# Patient Record
Sex: Male | Born: 1940 | Race: White | Hispanic: No | Marital: Married | Smoking: Never smoker
Health system: Southern US, Community
[De-identification: ages and names within clinical notes are randomized; demographics above are authoritative.]

## PROBLEM LIST (undated history)

## (undated) DIAGNOSIS — D696 Thrombocytopenia, unspecified: Secondary | ICD-10-CM

## (undated) DIAGNOSIS — M1990 Unspecified osteoarthritis, unspecified site: Secondary | ICD-10-CM

## (undated) DIAGNOSIS — G629 Polyneuropathy, unspecified: Secondary | ICD-10-CM

## (undated) DIAGNOSIS — E119 Type 2 diabetes mellitus without complications: Secondary | ICD-10-CM

## (undated) DIAGNOSIS — Z9889 Other specified postprocedural states: Secondary | ICD-10-CM

## (undated) DIAGNOSIS — T4145XA Adverse effect of unspecified anesthetic, initial encounter: Secondary | ICD-10-CM

## (undated) DIAGNOSIS — R42 Dizziness and giddiness: Secondary | ICD-10-CM

## (undated) DIAGNOSIS — I351 Nonrheumatic aortic (valve) insufficiency: Secondary | ICD-10-CM

## (undated) DIAGNOSIS — E669 Obesity, unspecified: Secondary | ICD-10-CM

## (undated) DIAGNOSIS — R5383 Other fatigue: Secondary | ICD-10-CM

## (undated) DIAGNOSIS — G4733 Obstructive sleep apnea (adult) (pediatric): Secondary | ICD-10-CM

## (undated) DIAGNOSIS — R112 Nausea with vomiting, unspecified: Secondary | ICD-10-CM

## (undated) DIAGNOSIS — T8859XA Other complications of anesthesia, initial encounter: Secondary | ICD-10-CM

## (undated) DIAGNOSIS — R001 Bradycardia, unspecified: Secondary | ICD-10-CM

## (undated) DIAGNOSIS — R0789 Other chest pain: Secondary | ICD-10-CM

## (undated) DIAGNOSIS — E785 Hyperlipidemia, unspecified: Secondary | ICD-10-CM

## (undated) DIAGNOSIS — I10 Essential (primary) hypertension: Secondary | ICD-10-CM

## (undated) DIAGNOSIS — I517 Cardiomegaly: Secondary | ICD-10-CM

## (undated) HISTORY — DX: Nonrheumatic aortic (valve) insufficiency: I51.7

## (undated) HISTORY — DX: Hyperlipidemia, unspecified: E78.5

## (undated) HISTORY — DX: Essential (primary) hypertension: I10

## (undated) HISTORY — PX: OTHER SURGICAL HISTORY: SHX169

## (undated) HISTORY — DX: Dizziness and giddiness: R42

## (undated) HISTORY — DX: Other chest pain: R07.89

## (undated) HISTORY — DX: Polyneuropathy, unspecified: G62.9

## (undated) HISTORY — DX: Obesity, unspecified: E66.9

## (undated) HISTORY — DX: Thrombocytopenia, unspecified: D69.6

## (undated) HISTORY — DX: Other fatigue: R53.83

## (undated) HISTORY — PX: CARDIAC CATHETERIZATION: SHX172

## (undated) HISTORY — DX: Bradycardia, unspecified: R00.1

## (undated) HISTORY — DX: Obstructive sleep apnea (adult) (pediatric): G47.33

## (undated) HISTORY — DX: Cardiomegaly: I35.1

---

## 2001-06-13 ENCOUNTER — Ambulatory Visit (HOSPITAL_COMMUNITY): Admission: RE | Admit: 2001-06-13 | Discharge: 2001-06-13 | Payer: Self-pay | Admitting: Internal Medicine

## 2002-01-17 ENCOUNTER — Emergency Department (HOSPITAL_COMMUNITY): Admission: EM | Admit: 2002-01-17 | Discharge: 2002-01-17 | Payer: Self-pay | Admitting: Emergency Medicine

## 2002-01-17 ENCOUNTER — Encounter: Payer: Self-pay | Admitting: Emergency Medicine

## 2003-07-02 ENCOUNTER — Other Ambulatory Visit: Admission: RE | Admit: 2003-07-02 | Discharge: 2003-07-02 | Payer: Self-pay | Admitting: General Surgery

## 2006-09-06 ENCOUNTER — Encounter: Payer: Self-pay | Admitting: Cardiology

## 2007-01-03 ENCOUNTER — Encounter: Payer: Self-pay | Admitting: Cardiology

## 2007-01-05 ENCOUNTER — Encounter: Payer: Self-pay | Admitting: Cardiology

## 2007-01-09 ENCOUNTER — Encounter: Payer: Self-pay | Admitting: Cardiology

## 2007-01-31 ENCOUNTER — Ambulatory Visit: Payer: Self-pay | Admitting: Cardiology

## 2007-02-07 ENCOUNTER — Ambulatory Visit: Payer: Self-pay | Admitting: Cardiology

## 2007-02-07 ENCOUNTER — Encounter: Payer: Self-pay | Admitting: Cardiology

## 2007-03-13 ENCOUNTER — Ambulatory Visit: Payer: Self-pay | Admitting: Cardiology

## 2010-02-02 ENCOUNTER — Encounter: Payer: Self-pay | Admitting: Cardiology

## 2010-03-09 ENCOUNTER — Encounter: Payer: Self-pay | Admitting: Cardiology

## 2010-04-15 ENCOUNTER — Encounter: Payer: Self-pay | Admitting: Cardiology

## 2010-04-22 ENCOUNTER — Encounter: Payer: Self-pay | Admitting: Cardiology

## 2010-04-22 ENCOUNTER — Encounter: Payer: Self-pay | Admitting: Physician Assistant

## 2010-04-23 ENCOUNTER — Encounter: Payer: Self-pay | Admitting: Cardiology

## 2010-04-23 ENCOUNTER — Ambulatory Visit: Payer: Self-pay | Admitting: Cardiology

## 2010-05-11 ENCOUNTER — Encounter: Payer: Self-pay | Admitting: Cardiology

## 2010-05-11 DIAGNOSIS — I498 Other specified cardiac arrhythmias: Secondary | ICD-10-CM | POA: Insufficient documentation

## 2010-05-11 DIAGNOSIS — G4730 Sleep apnea, unspecified: Secondary | ICD-10-CM | POA: Insufficient documentation

## 2010-05-11 DIAGNOSIS — E669 Obesity, unspecified: Secondary | ICD-10-CM | POA: Insufficient documentation

## 2010-05-11 DIAGNOSIS — R42 Dizziness and giddiness: Secondary | ICD-10-CM

## 2010-05-11 DIAGNOSIS — I10 Essential (primary) hypertension: Secondary | ICD-10-CM | POA: Insufficient documentation

## 2010-05-11 DIAGNOSIS — R079 Chest pain, unspecified: Secondary | ICD-10-CM

## 2010-05-12 ENCOUNTER — Ambulatory Visit: Payer: Self-pay | Admitting: Cardiology

## 2010-05-12 DIAGNOSIS — I359 Nonrheumatic aortic valve disorder, unspecified: Secondary | ICD-10-CM | POA: Insufficient documentation

## 2010-05-12 DIAGNOSIS — D696 Thrombocytopenia, unspecified: Secondary | ICD-10-CM

## 2010-05-12 DIAGNOSIS — D693 Immune thrombocytopenic purpura: Secondary | ICD-10-CM | POA: Insufficient documentation

## 2010-05-13 ENCOUNTER — Ambulatory Visit: Payer: Self-pay | Admitting: Cardiology

## 2010-05-13 ENCOUNTER — Encounter: Payer: Self-pay | Admitting: Cardiology

## 2010-05-18 ENCOUNTER — Encounter (INDEPENDENT_AMBULATORY_CARE_PROVIDER_SITE_OTHER): Payer: Self-pay | Admitting: *Deleted

## 2010-11-24 NOTE — Consult Note (Signed)
Summary: CARDIOLOGY CONSULT/ [facility]  CARDIOLOGY CONSULT/ [facility]   Imported By: Zachary George 05/11/2010 16:04:01  _____________________________________________________________________  External Attachment:    Type:   Image     Comment:   External Document

## 2010-11-24 NOTE — Letter (Signed)
Summary: Engineer, materials at [facility]  [address]   [place], [name] [address]   Phone: [phone]  Fax: [phone]        May 18, 2010 MRN: [id number]    GIANCARLO ASKREN [address] [place], [name]  [address]    Dear Mr. FRONCZAK,  Your test ordered by Selena Batten has been reviewed by your physician (or physician assistant) and was found to be normal or stable. Your physician (or physician assistant) felt no changes were needed at this time.  __X__ Echocardiogram  ____ Cardiac Stress Test  ____ Lab Work  ____ Peripheral vascular study of arms, legs or neck  ____ CT scan or X-ray  ____ Lung or Breathing test  ____ Other:   Thank you.   Cyril Loosen, RN, BSN    Duane Boston, M.D., F.A.C.C. Thressa Sheller, M.D., F.A.C.C. Oneal Grout, M.D., F.A.C.C. Cheree Ditto, M.D., F.A.C.C. Daiva Nakayama, M.D., F.A.C.C. Kenney Houseman, M.D., F.A.C.C. Jeanne Ivan, PA-C

## 2010-11-24 NOTE — Assessment & Plan Note (Signed)
Summary: EPH - POST [facility] 6/30   Visit Type:  post hospital visit Referring Provider:  Margo Common, MD Primary Provider:  Doreen Beam, MD  CC:  bradycardia.  History of Present Illness: The patient is seen post hospitalization. He was recently there was vertigo. The workup of this continues. He did have some bradycardia. His beta blocker was stopped. He is here today feeling fine. He has not had syncope or presyncope. In the remote past the patient underwent catheterization showing no significant coronary disease. He's also had a nuclear scan in 2008 revealing no significant ischemia. There is mention in his records and normal LV function and mild aortic insufficiency. I cannot find the echo that supports this. Neuro echo is on record in our hospital system.  Preventive Screening-Counseling & Management  Alcohol-Tobacco     Smoking Status: never  Current Medications (verified): 1)  Meclizine Hcl 25 Mg Tabs (Meclizine Hcl) .... Take 1/2 Tab (12.5mg ) Three Times A Day 2)  Exforge 10-320 Mg Tabs (Amlodipine Besylate-Valsartan) .... Take 1 Tablet By Mouth Once A Day 3)  Fish Oil 1000 Mg Caps (Omega-3 Fatty Acids) .... Take 1 Tablet By Mouth Two Times A Day 4)  Multivitamins  Tabs (Multiple Vitamin) .... Take 1 Tablet By Mouth Once A Day  Allergies (verified): 1)  ! * Pencillin  Past History:  Past Medical History: Chest pains  atypical    a. Low-risk exercise stress Cardiolite EF 60% April 2008    b. Reportedly normal remote cardiac catheterization  @ [facility] EF   60%...nuclear...2008 Aortic regurgitation.... mild...echo Thrombocytopenia   mild... (followed by Dr. Cleone Slim) Obstructive sleep apnea,  C-Pap compliant Hypertension Dyslipidemia Obesity Vertigo   hospital[facility]6/2011 Bradycardia   hospital..[facility]6/2011....stopped  beta blockers.  Social History: Smoking Status:  never  Review of Systems       Patient denies fever, chills, headache, sweats, rash, change in vision, change in  hearing, chest pain, cough, nausea vomiting, urinary symptoms. All other systems are reviewed and are negative.  Vital Signs:  Patient profile:   70 year old male Height:      71 inches Weight:      263.50 pounds BMI:     36.88 Pulse rate:   68 / minute BP sitting:   148 / 81  (left arm) Cuff size:   regular  Vitals Entered By: Hoover Brunette, LPN (May 12, 2010 11:23 AM)  Nutrition Counseling: Patient's BMI is greater than 25 and therefore counseled on weight management options. CC: bradycardia Is Patient Diabetic? No Comments post hosp.  Saw Dr. Karilyn Cota yesterday, platelet count was 73,000.  Has him scheduled for colonoscopy this Friday morning.  Also, going to have test run this Thursday to check about his vertigo.  (Does see Dr. Cleone Slim routinely for platelets)   Physical Exam  General:  patient is stable. Eyes:  no xanthelasma. Neck:  no jugular venous distention. Chest Wall:  no chest wall tenderness. Lungs:  lungs are clear respiratory effort is nonlabored. Heart:  cardiac exam reveals S1 and S2. There is no clicks or significant murmurs. Abdomen:  abdomen is soft. Msk:  no musculoskeletal deformities. Extremities:  no peripheral edema. Skin:  no skin rashes. Psych:  patient is oriented to person time and place. Affect is normal.   Impression & Recommendations:  Problem # 1:  BRADYCARDIA (ICD-427.89) Bradycardia is resolved. He will remain off beta blockers.  Problem # 2:  VERTIGO (ICD-780.4) His vertigo is being assessed on an ongoing basis. This  has not a cardiac issue at this time.  Problem # 3:  HYPERTENSION (ICD-401.9)  His updated medication list for this problem includes:    Exforge 10-320 Mg Tabs (Amlodipine besylate-valsartan) .Marland Kitchen... Take 1 tablet by mouth once a day Systolic blood pressure is mildly elevated. The patient has low platelets and we have to be careful with all medications. He is in the process of losing weight. I agree with the plan to not add any  other hypertensive meds at this time.  Problem # 4:  THROMBOCYTOPENIA (ICD-287.5) This is followed very carefully by Dr.Karb. We will be very careful with any medications that we consider overtime.  Problem # 5:  AORTIC INSUFFICIENCY (ICD-424.1)  By history there was mild aortic insufficiency in the past. I do not hear significant AI at this time. I cannot find any echoes have been done in many years. I feel it is most prudent to do a two-dimensional echo to be sure that the patient has not developed significant worsening in aortic insufficiency. This study will be arranged and we will be in touch with him with the result.  Other Orders: 2-D Echocardiogram (2D Echo)  Patient Instructions: 1)  Your physician has requested that you have an echocardiogram.  Echocardiography is a painless test that uses sound waves to create images of your heart. It provides your doctor with information about the size and shape of your heart and how well your heart's chambers and valves are working.  This procedure takes approximately one hour. There are no restrictions for this procedure. If the results of your test are normal or stable, you will receive a letter. If they are abnormal, the nurse will contact you by phone.  2)  Your physician wants you to follow-up in: 1 year. You will receive a reminder letter in the mail one-two months in advance. If you don't receive a letter, please call our office to schedule the follow-up appointment.  3)  Your physician recommends that you continue on your current medications as directed. Please refer to the Current Medication list given to you today.

## 2010-11-24 NOTE — Miscellaneous (Signed)
  Clinical Lists Changes  Problems: Removed problem of DIZZINESS AND GIDDINESS (ICD-780.4) Added new problem of VERTIGO (ICD-780.4) Added new problem of BRADYCARDIA (ICD-427.89) Observations: Added new observation of PAST MED HX: Chest pains  atypical    a. Low-risk exercise stress Cardiolite EF 60% April 2008    b. Reportedly normal remote cardiac catheterization  @ [facility] EF   60%...nuclear...2008 Aortic regurgitation.... mild...echo Thrombocytopenia   mild... (followed by Dr. Cleone Slim) Obstructive sleep apnea,  C-Pap compliant Hypertension Dyslipidemia Obesity Vertigo   hospital..[facility]6/2011 Bradycardia   hospital..[facility]6/2011....stopped  beta blockers.  (05/11/2010 17:03) Added new observation of PRIMARY MD: Doreen Beam, MD (05/11/2010 17:03)       Past History:  Past Medical History: Chest pains  atypical    a. Low-risk exercise stress Cardiolite EF 60% April 2008    b. Reportedly normal remote cardiac catheterization  @ [facility] EF   60%...nuclear...2008 Aortic regurgitation.... mild...echo Thrombocytopenia   mild... (followed by Dr. Cleone Slim) Obstructive sleep apnea,  C-Pap compliant Hypertension Dyslipidemia Obesity Vertigo   hospital[facility]6/2011 Bradycardia   hospital..[facility]6/2011....stopped  beta blockers.

## 2010-11-24 NOTE — Letter (Signed)
Summary: [facility] D/C DR. DHRUV VYAS  [facility]C DR. [facility]UV [facility]YAS   Imported By: Zachary George 05/11/2010 16:08:48  _____________________________________________________________________  External Attachment:    Type:   Image     Comment:   External Document

## 2011-03-09 NOTE — Assessment & Plan Note (Signed)
[facility]                          [facility]   NAME:[name], [name]                     MRN:          [id number]  DATE:03/13/2007                            DOB:          1941-05-20    REASON FOR PHYSICAL:  Scheduled clinic followup. Please refer to my  initial consultation note of January 31, 2007 for full details.   The patient returns in followup of recent evaluation for atypical chest  discomfort in the context of numerous cardiac risk factors and a  reportedly normal remote cardiac catheterization at [facility].   Our initial recommendation was to up-titrate his antihypertensive  regimen for better blood pressure control and then follow up with an  exercise stress test. This was completed and he actually was able to  exercise for over 10 minutes, although falling short of target heart  rate with only 83% of PMHR. Nevertheless, there was no associated chest  pain or significant EKG changes and perfusion imaging was essentially  negative for ischemia and consistent with a low risk study. Calculated  ejection fraction was 60%.   Clinically, the patient reports no further chest pain and does feel a  little better since we increased the Sular to 40 mg daily. The patient  was also started on a baby aspirin and he is tolerating this well,  although he does note some mild occasional blood tinged nasal  secretions. He does have a history of epistaxis, which resolved with  change from Diovan HCT to current regular Diovan. He also feels that  this may be partly due to the fact that he wears a CPAP mask, although  it is pre-treated with moisturizer.   REVIEW OF SYSTEMS:  Intermittent blurred vision, particularly late in  the day. Remaining systems negative.   CURRENT MEDICATIONS:  Aspirin 81 mg daily, Sular 40 mg daily, and Diovan  320 mg daily.   PHYSICAL EXAMINATION:  VITAL SIGNS:  Blood pressure 140/71,  pulse 75 and  regular. Weight 272 (up 6 pounds.)  GENERAL:  A 70 year old male, obese, sitting upright in no distress.  HEENT:  Normocephalic and atraumatic.  NECK:  Palpable bilateral carotid pulses but no bruits. Unable to assess  JVD secondary to neck girth.  LUNGS:  Clear to auscultation all fields.  HEART:  Regular rate and rhythm (S1 and S2.) No significant murmurs. No  rubs.  ABDOMEN:  Protuberant, non-tender.  EXTREMITIES:  No significant edema.  NEUROLOGIC:  No deficits.   IMPRESSION:  1. Atypical chest pain, resolved.      a.     Recent low risk sub-optimal exercise stress Cardiolite.      b.     Reportedly normal remote cardiac catheterization [facility].)  2. Preserved left ventricular function.  3. Multiple cardiac risk factors.      a.     Hypertension:  Significantly improved with recent medication       adjustment.  4. Obstructive sleep apnea, on CPAP.   PLAN:  1. Continue current medication regimen.  2. Initiate diet/exercise regimen for treatment of obesity,  hypertension, and hyperlipidemia. Regarding the latter, we      discussed a recent lipid profile, revealing total cholesterol 220,      triglycerides 233, HDL 31, and LDL 142. At the present time, the      patient is not amenable to adding another medication to his      regimen. Therefore, we will reassess his lipid status in 6 months      and then consider adding medication if he exhibits persistent      hyperlipidemia.  3. Schedule clinic followup with myself and Dr. Willa [name] in 3      months.      Rozell Searing, PA-C  Electronically Signed      Brian Abed, MD, [facility]  Electronically Signed   GS/MedQ  DD: 03/13/2007  DT: 03/13/2007  Job #: [id number]   cc:   Brian Maroon, MD, [facility]

## 2011-03-12 NOTE — Assessment & Plan Note (Signed)
[facility]                          [facility]   NAME:Mikesell, JAJA SWITALSKI                     MRN:          [id number]  DATE:01/31/2007                            DOB:          May 07, 1941    REASON FOR CONSULTATION:  Mr. Speir is a 70 year old male, with  reported history of normal coronary arteries by cardiac catheterization  at [facility] approximately 10 years ago, with  several cardiac risk factors including pre-diabetes mellitus, who is now  referred for evaluation of chest pain.   The patient reports new onset left-sided chest discomfort over the  preceding six months, with some progression, but with no clear  correlation with exertion or any type of activity. In fact, the chest  pain episodes are quite unpredictable in onset, typically described as a  sudden sharp twinge in the left upper chest with no associated  radiation. This is followed by a dull, pressure sensation and lasts less  than 5 minutes in duration. Although these have been occurring perhaps  twice a week, he does seem to feel that they have progressed in the last  several months. And although he has some symptoms suggestive of GERD, he  states that this is dissimilar to that. There is no strict correlation  to either swallowing food or following meals.   A recent 2D echo at Dr. Bonnita Levan office, reviewed by Dr. Andee Lineman, reveals  normal left ventricular function with mild aortic regurgitation.   A recent 12 lead electrocardiogram reveals sinus bradycardia at 57 bpm  with left axis deviation and no acute changes.   ALLERGIES:  PENICILLIN.   PAST MEDICAL HISTORY:  1. Hypertension.      a.     Diagnosed approximately 20 years.  2. Pre-diabetes mellitus.  3. History of hyperlipidemia.  4. Obesity.  5. History of epistaxis in 2004.      a.     While on Diovan/HCT.  6. History of hematuria.  7. Obstructive sleep apnea/on continuous positive  airway pressure      (CPAP).   PAST SURGICAL HISTORY:  Status post bilateral arthroscopic knee surgery.   SOCIAL HISTORY:  The patient is married, has three children. 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 He has never smoked  tobacco and drinks an occasional beer.   FAMILY HISTORY:  Mother deceased at age 37 secondary to fatal myocardial  infarction.   REVIEW OF SYSTEMS:  Denies any history of exertional chest discomfort.  Can easily climb a flight of stairs with no associated chest discomfort,  but does report a longstanding history of exertional dyspnea with no  recent exacerbation. Otherwise, as per HPI. Remainder of review of  systems is negative.   PHYSICAL EXAMINATION:  Blood pressure is 179/101, pulse 64 and regular.  Weight is 266.  GENERAL: A 70 year old male, obese, sitting upright, in no distress.  HEENT: Normocephalic, atraumatic. Bilateral xanthelasma.  NECK: Palpable bilateral carotid pulses without bruits.  LUNGS:  Clear to auscultation in all fields.  HEART: Regular rate and rhythm (S1, S2). No significant murmurs. No  rubs.  ABDOMEN: Protuberant,  nontender.  EXTREMITIES: Palpable femoral pulses without bruit; intact distal pulses  without edema.  NEURO: No focal deficit.   IMPRESSION:  1. Atypical chest pain.      a.     Reportedly normal remote cardiac catheterization.  2. Multiple cardiac risk factors.      a.     Hypertension.      b.     History of hyperlipidemia.      c.     Pre-diabetes mellitus.      d.     Family history of premature coronary artery disease.      e.     Age.  3. History of epistaxis.  4. History of hematuria.  5. Obstructive sleep apnea/on continuous positive airway pressure      (CPAP).   PLAN:  Following review with Dr. Willa Rough, recommendation is to  proceed with further evaluation with an exercise stress Cardiolite. Of  note, however, the patient will need to have better blood pressure  control and we  will therefore up titrate Sular to 40 mg daily. The  patient will also be started on a baby aspirin and will have him return  to clinic in the next several weeks for scheduled followup and review of  stress test results and further recommendations.      Rozell Searing, PA-C  Electronically Signed      Luis Abed, MD, [facility]  Electronically Signed   GS/MedQ  DD: 01/31/2007  DT: 01/31/2007  Job #: [id number]

## 2011-08-31 ENCOUNTER — Telehealth: Payer: Self-pay | Admitting: Cardiology

## 2011-08-31 NOTE — Telephone Encounter (Signed)
Called patient.  I had spoke with patient regarding a payment made twice back in March or April 2011.  Patient was going to get the receipts or a copy of his credit card statement and any statements during that period.  I had contacted [facility] billing and [facility] billing inquiring on him paying twice.  The payment was in the amount of 47 dollars and change, but did not know the exact amount.    I was following up on the patient request to look further into this matter.  Patient has not brought information/contact me.  Not sure if he has resolved the matter.

## 2012-04-19 ENCOUNTER — Encounter: Payer: Self-pay | Admitting: Cardiology

## 2012-06-19 DIAGNOSIS — I10 Essential (primary) hypertension: Secondary | ICD-10-CM | POA: Diagnosis not present

## 2012-06-19 DIAGNOSIS — D696 Thrombocytopenia, unspecified: Secondary | ICD-10-CM | POA: Diagnosis not present

## 2012-06-20 ENCOUNTER — Encounter: Payer: Medicare Other | Admitting: Hematology and Oncology

## 2012-06-20 DIAGNOSIS — D696 Thrombocytopenia, unspecified: Secondary | ICD-10-CM | POA: Diagnosis not present

## 2012-06-20 DIAGNOSIS — I10 Essential (primary) hypertension: Secondary | ICD-10-CM | POA: Diagnosis not present

## 2012-06-21 DIAGNOSIS — H5200 Hypermetropia, unspecified eye: Secondary | ICD-10-CM | POA: Diagnosis not present

## 2012-06-21 DIAGNOSIS — H524 Presbyopia: Secondary | ICD-10-CM | POA: Diagnosis not present

## 2012-06-21 DIAGNOSIS — H01009 Unspecified blepharitis unspecified eye, unspecified eyelid: Secondary | ICD-10-CM | POA: Diagnosis not present

## 2012-06-21 DIAGNOSIS — H52229 Regular astigmatism, unspecified eye: Secondary | ICD-10-CM | POA: Diagnosis not present

## 2012-07-27 DIAGNOSIS — J309 Allergic rhinitis, unspecified: Secondary | ICD-10-CM | POA: Diagnosis not present

## 2012-07-27 DIAGNOSIS — I10 Essential (primary) hypertension: Secondary | ICD-10-CM | POA: Diagnosis not present

## 2012-07-27 DIAGNOSIS — E7800 Pure hypercholesterolemia, unspecified: Secondary | ICD-10-CM | POA: Diagnosis not present

## 2012-07-27 DIAGNOSIS — IMO0001 Reserved for inherently not codable concepts without codable children: Secondary | ICD-10-CM | POA: Diagnosis not present

## 2012-08-29 DIAGNOSIS — I10 Essential (primary) hypertension: Secondary | ICD-10-CM | POA: Diagnosis not present

## 2012-08-30 DIAGNOSIS — Z23 Encounter for immunization: Secondary | ICD-10-CM | POA: Diagnosis not present

## 2012-11-02 DIAGNOSIS — E7800 Pure hypercholesterolemia, unspecified: Secondary | ICD-10-CM | POA: Diagnosis not present

## 2012-11-02 DIAGNOSIS — I10 Essential (primary) hypertension: Secondary | ICD-10-CM | POA: Diagnosis not present

## 2012-11-16 DIAGNOSIS — I10 Essential (primary) hypertension: Secondary | ICD-10-CM | POA: Diagnosis not present

## 2012-11-16 DIAGNOSIS — J069 Acute upper respiratory infection, unspecified: Secondary | ICD-10-CM | POA: Diagnosis not present

## 2013-01-22 DIAGNOSIS — M7660 Achilles tendinitis, unspecified leg: Secondary | ICD-10-CM | POA: Diagnosis not present

## 2013-01-22 DIAGNOSIS — G4730 Sleep apnea, unspecified: Secondary | ICD-10-CM | POA: Diagnosis not present

## 2013-01-22 DIAGNOSIS — R05 Cough: Secondary | ICD-10-CM | POA: Diagnosis not present

## 2013-03-16 DIAGNOSIS — Z79899 Other long term (current) drug therapy: Secondary | ICD-10-CM | POA: Diagnosis not present

## 2013-03-16 DIAGNOSIS — Z125 Encounter for screening for malignant neoplasm of prostate: Secondary | ICD-10-CM | POA: Diagnosis not present

## 2013-03-16 DIAGNOSIS — Z1212 Encounter for screening for malignant neoplasm of rectum: Secondary | ICD-10-CM | POA: Diagnosis not present

## 2013-03-16 DIAGNOSIS — Z0000 Encounter for general adult medical examination without abnormal findings: Secondary | ICD-10-CM | POA: Diagnosis not present

## 2013-03-16 DIAGNOSIS — R5381 Other malaise: Secondary | ICD-10-CM | POA: Diagnosis not present

## 2013-05-28 DIAGNOSIS — I10 Essential (primary) hypertension: Secondary | ICD-10-CM | POA: Diagnosis not present

## 2013-08-09 DIAGNOSIS — Z23 Encounter for immunization: Secondary | ICD-10-CM | POA: Diagnosis not present

## 2013-09-03 DIAGNOSIS — G4730 Sleep apnea, unspecified: Secondary | ICD-10-CM | POA: Diagnosis not present

## 2013-09-03 DIAGNOSIS — I10 Essential (primary) hypertension: Secondary | ICD-10-CM | POA: Diagnosis not present

## 2013-09-21 DIAGNOSIS — J029 Acute pharyngitis, unspecified: Secondary | ICD-10-CM | POA: Diagnosis not present

## 2013-10-09 DIAGNOSIS — J029 Acute pharyngitis, unspecified: Secondary | ICD-10-CM | POA: Diagnosis not present

## 2013-10-15 DIAGNOSIS — J029 Acute pharyngitis, unspecified: Secondary | ICD-10-CM | POA: Diagnosis not present

## 2014-04-09 ENCOUNTER — Ambulatory Visit (INDEPENDENT_AMBULATORY_CARE_PROVIDER_SITE_OTHER): Payer: Medicare Other | Admitting: Orthopedic Surgery

## 2014-04-09 ENCOUNTER — Ambulatory Visit (INDEPENDENT_AMBULATORY_CARE_PROVIDER_SITE_OTHER): Payer: Medicare Other

## 2014-04-09 VITALS — BP 155/84 | Ht 71.0 in | Wt 276.0 lb

## 2014-04-09 DIAGNOSIS — M1710 Unilateral primary osteoarthritis, unspecified knee: Secondary | ICD-10-CM | POA: Insufficient documentation

## 2014-04-09 DIAGNOSIS — M23329 Other meniscus derangements, posterior horn of medial meniscus, unspecified knee: Secondary | ICD-10-CM | POA: Diagnosis not present

## 2014-04-09 DIAGNOSIS — M179 Osteoarthritis of knee, unspecified: Secondary | ICD-10-CM

## 2014-04-09 DIAGNOSIS — M25569 Pain in unspecified knee: Secondary | ICD-10-CM

## 2014-04-09 DIAGNOSIS — IMO0002 Reserved for concepts with insufficient information to code with codable children: Secondary | ICD-10-CM | POA: Diagnosis not present

## 2014-04-09 NOTE — Progress Notes (Signed)
Patient ID: EGE MUCKEY, male   DOB: 1941-08-21, 73 y.o.   MRN: [id number]  Chief Complaint  Patient presents with  . Knee Pain    Left knee pain    73 yo male pain x 2 months which started after he mowed his grass sideways on an incline. We'll go with pain the next day. The pain worsened became constant. It's worse when he has to twist or turn on it. Pain is 4/10 sharp and described as aching.  Ibuprofen over-the-counter medication has been taken  He status post to right knee arthroscopies and one left knee arthroscopy the left one being done in 1988  Current medications amlodipine 10, glipizide 5, metformin 500, irresartan, carvedilol  Past Medical History  Diagnosis Date  . Atypical chest pain     Low-risk exercise stress Cardiolite EF 60% April 2008. Reportedly normal remote cardiac catheterization @ [facility] EF 60%...nuclear....2008  . Mild aortic regurgitation with LV dilation by prior echocardiogram   . Thrombocytopenia     mild follow Dr. Alonna Minium  . Obstructive sleep apnea     C-Pap compliant  . Hypertension   . Dyslipidemia   . Obesity   . Vertigo     Hospital 03/2010  . Bradycardia     hospital 03/2010 stopped beta blocker    Complete review of systems as been done nothing to at this illness see scanned documents  Vital signs: BP 155/84  Ht 5\' 11"  (1.803 m)  Wt 276 lb (125.193 kg)  BMI 38.51 kg/m2   General the patient is well-developed and well-nourished grooming and hygiene are normal Oriented x3 Mood and affect normal Ambulation slightly favors the left side Inspection of the left knee joint effusion small, full range of motion. Ligament stable. Motor exam intact. Skin clean dry and intact.  Cardiovascular exam normal except for some pitting edema and skin changes noted from most likely venous stasis disease  Sensation is normal  McMurray sign is positive and he has significant amount of medial joint line tenderness to suggest posterior horn meniscal  tear  X-rays x-rays show moderate arthritis  Diagnosis torn medial meniscus osteoarthritis left knee  Recommend MRI left knee to evaluate meniscus 4 tear and plan surgical resection.

## 2014-04-09 NOTE — Patient Instructions (Signed)
MRI Ordered. 

## 2014-04-28 ENCOUNTER — Ambulatory Visit
Admission: RE | Admit: 2014-04-28 | Discharge: 2014-04-28 | Disposition: A | Discharge status: Home / Self Care | Payer: Medicare Other | Source: Ambulatory Visit | Attending: Orthopedic Surgery | Admitting: Orthopedic Surgery

## 2014-04-28 DIAGNOSIS — IMO0002 Reserved for concepts with insufficient information to code with codable children: Secondary | ICD-10-CM | POA: Diagnosis not present

## 2014-04-28 DIAGNOSIS — M23329 Other meniscus derangements, posterior horn of medial meniscus, unspecified knee: Secondary | ICD-10-CM

## 2014-04-28 DIAGNOSIS — M1710 Unilateral primary osteoarthritis, unspecified knee: Secondary | ICD-10-CM | POA: Diagnosis not present

## 2014-05-21 ENCOUNTER — Ambulatory Visit (INDEPENDENT_AMBULATORY_CARE_PROVIDER_SITE_OTHER): Payer: Medicare Other | Admitting: Orthopedic Surgery

## 2014-05-21 ENCOUNTER — Other Ambulatory Visit: Payer: Self-pay | Admitting: *Deleted

## 2014-05-21 DIAGNOSIS — S83259A Bucket-handle tear of lateral meniscus, current injury, unspecified knee, initial encounter: Secondary | ICD-10-CM

## 2014-05-21 DIAGNOSIS — M25569 Pain in unspecified knee: Secondary | ICD-10-CM | POA: Diagnosis not present

## 2014-05-21 DIAGNOSIS — M25562 Pain in left knee: Secondary | ICD-10-CM

## 2014-05-21 DIAGNOSIS — M23329 Other meniscus derangements, posterior horn of medial meniscus, unspecified knee: Secondary | ICD-10-CM

## 2014-05-21 DIAGNOSIS — M23322 Other meniscus derangements, posterior horn of medial meniscus, left knee: Secondary | ICD-10-CM

## 2014-05-21 NOTE — Progress Notes (Signed)
Patient ID: Brian [name], male   DOB: April 18, 1941, 73 y.o.   MRN: [id number]  MRI results left knee still complains of pain including calf pain and lateral pain medial pain   I agree with the following MRI report .IMPRESSION: 1. Complex radial tear involving the posterior horn of the medial meniscus. There is 6 mm of distraction and associated medial protrusion. 2. Longitudinal tear involving the anterior horn of the lateral meniscus. 3. Intact ligamentous structures. Mucoid degeneration of the ACL is noted. 4. Moderate tricompartmental degenerative changes most notable in the medial compartment. 5. Small joint effusion and mild synovitis.  Small Baker's cyst.  Preop counseling the patient was told he has arthritis he was still have some residual pain, we also discussed knee replacement I think it is too much surgery based on his symptoms he was functioning fine had a knee injury and then started having problems. He agrees after discussion. We discussed his postop course complications etc.  Time spent 15 minutes for preop counseling and surgical planning

## 2014-05-21 NOTE — Patient Instructions (Signed)
Scheduled for arthroscopy of the knee  You've been scheduled for arthroscopic surgery of your knee.  All surgeries carry some risks. General risks of surgery include bleeding infection and blood clots.  Specific risks related to knee arthroscopy include continued pain, persistent swelling, mechanical symptoms of catching locking and giving way and persistent weakness in the knee.   If these risks are not worth taking please let me know so that we can consider other nonoperative treatments which include but are not limited to medication, injection, physical therapy.   Please stop all blood thinners and anti-inflammatories before surgery This includes but is not limited to Coumadin, Lovenox, warfarin, Motrin, ibuprofen, diclofenac, Ticlid, xarelto, Plavix.

## 2014-05-24 ENCOUNTER — Encounter (HOSPITAL_COMMUNITY): Payer: Self-pay | Admitting: Pharmacy Technician

## 2014-05-27 ENCOUNTER — Other Ambulatory Visit (HOSPITAL_COMMUNITY): Payer: Medicare Other

## 2014-05-27 NOTE — Patient Instructions (Signed)
Your procedure is scheduled on: 05/31/2014  Report to [facility] at  6:15   AM.  Call this number if you have problems the morning of surgery: [phone]   Remember:   Do not drink or eat food:After Midnight.  :  Take these medicines the morning of surgery with A SIP OF WATER: Amlodipine, Carvedilol and Avapro   Do not wear jewelry, make-up or nail polish.  Do not wear lotions, powders, or perfumes. You may wear deodorant.  Do not shave 48 hours prior to surgery. Men may shave face and neck.  Do not bring valuables to the hospital.  Contacts, dentures or bridgework may not be worn into surgery.  Leave suitcase in the car. After surgery it may be brought to your room.  For patients admitted to the hospital, checkout time is 11:00 AM the day of discharge.   Patients discharged the day of surgery will not be allowed to drive home.    Special Instructions: Shower using CHG night before surgery and shower the day of surgery use CHG.  Use special wash - you have one bottle of CHG for all showers.  You should use approximately 1/2 of the bottle for each shower.   Please read over the following fact sheets that you were given: Pain Booklet, MRSA Information, Surgical Site Infection Prevention and Care and Recovery After Surgery  General Anesthesia, Care After Refer to this sheet in the next few weeks. These instructions provide you with information on caring for yourself after your procedure. Your health care provider may also give you more specific instructions. Your treatment has been planned according to current medical practices, but problems sometimes occur. Call your health care provider if you have any problems or questions after your procedure. WHAT TO EXPECT AFTER THE PROCEDURE After the procedure, it is typical to experience:  Sleepiness.  Nausea and vomiting. HOME CARE INSTRUCTIONS  For the first 24 hours after general anesthesia:  Have a responsible person with you.  Do not  drive a car. If you are alone, do not take public transportation.  Do not drink alcohol.  Do not take medicine that has not been prescribed by your health care provider.  Do not sign important papers or make important decisions.  You may resume a normal diet and activities as directed by your health care provider.  Change bandages (dressings) as directed.  If you have questions or problems that seem related to general anesthesia, call the hospital and ask for the anesthetist or anesthesiologist on call. SEEK MEDICAL CARE IF:  You have nausea and vomiting that continue the day after anesthesia.  You develop a rash. SEEK IMMEDIATE MEDICAL CARE IF:   You have difficulty breathing.  You have chest pain.  You have any allergic problems. Document Released: 01/17/2001 Document Revised: 10/16/2013 Document Reviewed: 04/26/2013 [facility] Patient Information 2015 [place], [place]. This information is not intended to replace advice given to you by your health care provider. Make sure you discuss any questions you have with your health care provider. Arthroscopic Procedure, Knee An arthroscopic procedure can find what is wrong with your knee. PROCEDURE Arthroscopy is a surgical technique that allows your orthopedic surgeon to diagnose and treat your knee injury with accuracy. They will look into your knee through a small instrument. This is almost like a small (pencil sized) telescope. Because arthroscopy affects your knee less than open knee surgery, you can anticipate a more rapid recovery. Taking an active role by following your caregiver's  instructions will help with rapid and complete recovery. Use crutches, rest, elevation, ice, and knee exercises as instructed. The length of recovery depends on various factors including type of injury, age, physical condition, medical conditions, and your rehabilitation. Your knee is the joint between the large bones (femur and tibia) in your leg. Cartilage  covers these bone ends which are smooth and slippery and allow your knee to bend and move smoothly. Two menisci, thick, semi-lunar shaped pads of cartilage which form a rim inside the joint, help absorb shock and stabilize your knee. Ligaments bind the bones together and support your knee joint. Muscles move the joint, help support your knee, and take stress off the joint itself. Because of this all programs and physical therapy to rehabilitate an injured or repaired knee require rebuilding and strengthening your muscles. AFTER THE PROCEDURE  After the procedure, you will be moved to a recovery area until most of the effects of the medication have worn off. Your caregiver will discuss the test results with you.  Only take over-the-counter or prescription medicines for pain, discomfort, or fever as directed by your caregiver. SEEK MEDICAL CARE IF:   You have increased bleeding from your wounds.  You see redness, swelling, or have increasing pain in your wounds.  You have pus coming from your wound.  You have an oral temperature above 102 F (38.9 C).  You notice a bad smell coming from the wound or dressing.  You have severe pain with any motion of your knee. SEEK IMMEDIATE MEDICAL CARE IF:   You develop a rash.  You have difficulty breathing.  You have any allergic problems. Document Released: 10/08/2000 Document Revised: 01/03/2012 Document Reviewed: 05/01/2008 [facility] Patient Information 2015 [place], [place]. This information is not intended to replace advice given to you by your health care provider. Make sure you discuss any questions you have with your health care provider.

## 2014-05-28 ENCOUNTER — Encounter (HOSPITAL_COMMUNITY): Payer: Self-pay

## 2014-05-28 ENCOUNTER — Telehealth: Payer: Self-pay | Admitting: Orthopedic Surgery

## 2014-05-28 ENCOUNTER — Other Ambulatory Visit: Payer: Self-pay

## 2014-05-28 ENCOUNTER — Encounter (HOSPITAL_COMMUNITY)
Admission: RE | Admit: 2014-05-28 | Discharge: 2014-05-28 | Disposition: A | Discharge status: Home / Self Care | Payer: Medicare Other | Source: Ambulatory Visit | Attending: Orthopedic Surgery | Admitting: Orthopedic Surgery

## 2014-05-28 ENCOUNTER — Other Ambulatory Visit: Payer: Self-pay | Admitting: *Deleted

## 2014-05-28 DIAGNOSIS — M179 Osteoarthritis of knee, unspecified: Secondary | ICD-10-CM

## 2014-05-28 DIAGNOSIS — I10 Essential (primary) hypertension: Secondary | ICD-10-CM | POA: Diagnosis not present

## 2014-05-28 DIAGNOSIS — G4733 Obstructive sleep apnea (adult) (pediatric): Secondary | ICD-10-CM | POA: Diagnosis not present

## 2014-05-28 DIAGNOSIS — M1710 Unilateral primary osteoarthritis, unspecified knee: Secondary | ICD-10-CM

## 2014-05-28 DIAGNOSIS — E785 Hyperlipidemia, unspecified: Secondary | ICD-10-CM | POA: Diagnosis not present

## 2014-05-28 DIAGNOSIS — E669 Obesity, unspecified: Secondary | ICD-10-CM | POA: Diagnosis not present

## 2014-05-28 DIAGNOSIS — M659 Unspecified synovitis and tenosynovitis, unspecified site: Secondary | ICD-10-CM | POA: Diagnosis not present

## 2014-05-28 DIAGNOSIS — X58XXXA Exposure to other specified factors, initial encounter: Secondary | ICD-10-CM | POA: Diagnosis not present

## 2014-05-28 DIAGNOSIS — IMO0002 Reserved for concepts with insufficient information to code with codable children: Secondary | ICD-10-CM | POA: Diagnosis present

## 2014-05-28 DIAGNOSIS — S83289A Other tear of lateral meniscus, current injury, unspecified knee, initial encounter: Secondary | ICD-10-CM | POA: Diagnosis not present

## 2014-05-28 DIAGNOSIS — Y929 Unspecified place or not applicable: Secondary | ICD-10-CM | POA: Diagnosis not present

## 2014-05-28 DIAGNOSIS — R0789 Other chest pain: Secondary | ICD-10-CM | POA: Diagnosis not present

## 2014-05-28 DIAGNOSIS — Z880 Allergy status to penicillin: Secondary | ICD-10-CM | POA: Diagnosis not present

## 2014-05-28 HISTORY — DX: Adverse effect of unspecified anesthetic, initial encounter: T41.45XA

## 2014-05-28 HISTORY — DX: Other specified postprocedural states: Z98.890

## 2014-05-28 HISTORY — DX: Type 2 diabetes mellitus without complications: E11.9

## 2014-05-28 HISTORY — DX: Other complications of anesthesia, initial encounter: T88.59XA

## 2014-05-28 HISTORY — DX: Other specified postprocedural states: R11.2

## 2014-05-28 LAB — BASIC METABOLIC PANEL
ANION GAP: 14 (ref 5–15)
BUN: 12 mg/dL (ref 6–23)
CHLORIDE: 101 meq/L (ref 96–112)
CO2: 23 meq/L (ref 19–32)
Calcium: 8.9 mg/dL (ref 8.4–10.5)
Creatinine, Ser: 0.71 mg/dL (ref 0.50–1.35)
GFR calc Af Amer: >90 mL/min (ref >90)
GFR calc non Af Amer: >90 mL/min (ref >90)
GLUCOSE: 159 mg/dL — AB (ref 70–99)
Potassium: 3.8 mEq/L (ref 3.7–5.3)
SODIUM: 138 meq/L (ref 137–147)

## 2014-05-28 LAB — HEMOGLOBIN AND HEMATOCRIT, BLOOD
HEMATOCRIT: 43.3 % (ref 39.0–52.0)
HEMOGLOBIN: 15.5 g/dL (ref 13.0–17.0)

## 2014-05-28 NOTE — Telephone Encounter (Signed)
Regarding out-patient surgery (knee arthroscopy) scheduled at [facility] 05/31/14 - Contacted insurers, Medicare, primary - no pre-authorization required per Medicare guidelines, and called [employer], secondary - per Cristie Hem A, no pre-authorization required, as they follow Medicare guidelines.

## 2014-05-28 NOTE — Telephone Encounter (Signed)
Ok give itto him

## 2014-05-28 NOTE — Telephone Encounter (Signed)
Brian [name] wants an order for a walker.  He wants to get it before his surgery on Friday. He will pick up the order himself

## 2014-05-28 NOTE — Telephone Encounter (Signed)
Routing to dr harrison 

## 2014-05-29 NOTE — Telephone Encounter (Signed)
Patient aware prescription is available for pick up

## 2014-05-30 NOTE — H&P (Addendum)
Chief Complaint   Patient presents with   .  Knee Pain       Left knee pain     73 yo male pain x 2 months which started after he mowed his grass sideways on an incline. We'll go with pain the next day. The pain worsened became constant. It's worse when he has to twist or turn on it. Pain is 4/10 sharp and described as aching.  Ibuprofen over-the-counter medication has been taken  He status post to right knee arthroscopies and one left knee arthroscopy the left one being done in 1988  Current medications amlodipine 10, glipizide 5, metformin 500, irresartan, carvedilol    Past Medical History   Diagnosis  Date   .  Atypical chest pain         Low-risk exercise stress Cardiolite EF 60% April 2008. Reportedly normal remote cardiac catheterization @ [facility] EF 60%...nuclear....2008   .  Mild aortic regurgitation with LV dilation by prior echocardiogram     .  Thrombocytopenia         mild follow Dr. Alonna Minium   .  Obstructive sleep apnea         C-Pap compliant   .  Hypertension     .  Dyslipidemia     .  Obesity     .  Vertigo         Hospital 03/2010   .  Bradycardia         hospital 03/2010 stopped beta blocker    Past Surgical History  Procedure Laterality Date  . Cardiac catheterization    . Status post bilateral arthroscopic knee surgery     Family History  Problem Relation Age of Onset  . Heart attack Mother     fatal, cause of death   History  Substance Use Topics  . Smoking status: Never Smoker   . Smokeless tobacco: Not on file  . Alcohol Use: Yes     Comment: wine on occasion   Meds ordered this encounter  Medications  . Digestive Enzymes (PAPAYA ENZYME PO)    Sig: Take 1 tablet by mouth daily.  . Misc Natural Products (OSTEO BI-FLEX ADV DOUBLE ST PO)    Sig: Take 1 tablet by mouth daily.     Complete review of systems as been done nothing to at this illness see scanned documents  Vital signs: BP 155/84  Ht 5\' 11"  (1.803 m)  Wt 276 lb (125.193 kg)  BMI  38.51 kg/m2   General the patient is well-developed and well-nourished grooming and hygiene are normal Oriented x3 Mood and affect normal Ambulation slightly favors the left side Inspection of the left knee joint effusion small, full range of motion. Ligament stable. Motor exam intact. Skin clean dry and intact.  Cardiovascular exam normal except for some pitting edema and skin changes noted from most likely venous stasis disease  Sensation is normal  McMurray sign is positive and he has significant amount of medial joint line tenderness to suggest posterior horn meniscal tear  X-rays x-rays show moderate arthritis  Diagnosis torn medial meniscus osteoarthritis left knee  IMPRESSION: 1. Complex radial tear involving the posterior horn of the medial meniscus. There is 6 mm of distraction and associated medial protrusion. 2. Longitudinal tear involving the anterior horn of the lateral meniscus. 3. Intact ligamentous structures. Mucoid degeneration of the ACL is noted. 4. Moderate tricompartmental degenerative changes most notable in the medial compartment. 5. Small joint effusion and mild synovitis.  Small Baker's cyst.   SALK MEDIAL MENISCUS AND LATERAL MENISCUS RESECTION

## 2014-05-31 ENCOUNTER — Encounter (HOSPITAL_COMMUNITY)
Admission: RE | Disposition: A | Discharge status: Home / Self Care | Payer: Self-pay | Source: Ambulatory Visit | Attending: Orthopedic Surgery

## 2014-05-31 ENCOUNTER — Ambulatory Visit (HOSPITAL_COMMUNITY)
Admission: RE | Admit: 2014-05-31 | Discharge: 2014-05-31 | Disposition: A | Discharge status: Home / Self Care | Payer: Medicare Other | Source: Ambulatory Visit | Attending: Orthopedic Surgery | Admitting: Orthopedic Surgery

## 2014-05-31 ENCOUNTER — Ambulatory Visit (HOSPITAL_COMMUNITY): Payer: Medicare Other | Admitting: Anesthesiology

## 2014-05-31 ENCOUNTER — Encounter (HOSPITAL_COMMUNITY): Payer: Self-pay | Admitting: *Deleted

## 2014-05-31 ENCOUNTER — Encounter (HOSPITAL_COMMUNITY): Payer: Medicare Other | Admitting: Anesthesiology

## 2014-05-31 DIAGNOSIS — I10 Essential (primary) hypertension: Secondary | ICD-10-CM | POA: Insufficient documentation

## 2014-05-31 DIAGNOSIS — E669 Obesity, unspecified: Secondary | ICD-10-CM | POA: Insufficient documentation

## 2014-05-31 DIAGNOSIS — IMO0002 Reserved for concepts with insufficient information to code with codable children: Secondary | ICD-10-CM | POA: Insufficient documentation

## 2014-05-31 DIAGNOSIS — M25562 Pain in left knee: Secondary | ICD-10-CM

## 2014-05-31 DIAGNOSIS — Z880 Allergy status to penicillin: Secondary | ICD-10-CM | POA: Insufficient documentation

## 2014-05-31 DIAGNOSIS — M659 Unspecified synovitis and tenosynovitis, unspecified site: Secondary | ICD-10-CM | POA: Insufficient documentation

## 2014-05-31 DIAGNOSIS — R0789 Other chest pain: Secondary | ICD-10-CM | POA: Insufficient documentation

## 2014-05-31 DIAGNOSIS — X58XXXA Exposure to other specified factors, initial encounter: Secondary | ICD-10-CM | POA: Insufficient documentation

## 2014-05-31 DIAGNOSIS — E785 Hyperlipidemia, unspecified: Secondary | ICD-10-CM | POA: Insufficient documentation

## 2014-05-31 DIAGNOSIS — G4733 Obstructive sleep apnea (adult) (pediatric): Secondary | ICD-10-CM | POA: Insufficient documentation

## 2014-05-31 DIAGNOSIS — M23322 Other meniscus derangements, posterior horn of medial meniscus, left knee: Secondary | ICD-10-CM

## 2014-05-31 DIAGNOSIS — S83289A Other tear of lateral meniscus, current injury, unspecified knee, initial encounter: Secondary | ICD-10-CM | POA: Insufficient documentation

## 2014-05-31 DIAGNOSIS — M23329 Other meniscus derangements, posterior horn of medial meniscus, unspecified knee: Secondary | ICD-10-CM | POA: Diagnosis not present

## 2014-05-31 DIAGNOSIS — Y929 Unspecified place or not applicable: Secondary | ICD-10-CM | POA: Insufficient documentation

## 2014-05-31 DIAGNOSIS — M1712 Unilateral primary osteoarthritis, left knee: Secondary | ICD-10-CM

## 2014-05-31 HISTORY — PX: KNEE ARTHROSCOPY WITH MEDIAL MENISECTOMY: SHX5651

## 2014-05-31 LAB — GLUCOSE, CAPILLARY
Glucose-Capillary: 157 mg/dL — ABNORMAL HIGH (ref 70–99)
Glucose-Capillary: 177 mg/dL — ABNORMAL HIGH (ref 70–99)

## 2014-05-31 SURGERY — ARTHROSCOPY, KNEE, WITH MEDIAL MENISCECTOMY
Anesthesia: General | Laterality: Left

## 2014-05-31 MED ORDER — MIDAZOLAM HCL 2 MG/2ML IJ SOLN: 1.0000 mg | INTRAMUSCULAR | Status: DC | PRN

## 2014-05-31 MED ORDER — HYDROCODONE-ACETAMINOPHEN 7.5-325 MG PO TABS: 1.0000 | ORAL_TABLET | ORAL | Status: DC | PRN

## 2014-05-31 MED ORDER — ONDANSETRON HCL 4 MG/2ML IJ SOLN: 4.0000 mg | Freq: Once | INTRAMUSCULAR | Status: DC

## 2014-05-31 MED ORDER — NEOSTIGMINE METHYLSULFATE 10 MG/10ML IV SOLN
INTRAVENOUS | Status: AC
  Filled 2014-05-31: qty 1

## 2014-05-31 MED ORDER — FENTANYL CITRATE 0.05 MG/ML IJ SOLN
INTRAMUSCULAR | Status: DC | PRN
  Administered 2014-05-31: 50 ug via INTRAVENOUS
  Administered 2014-05-31 (×2): 25 ug via INTRAVENOUS

## 2014-05-31 MED ORDER — DEXAMETHASONE SODIUM PHOSPHATE 4 MG/ML IJ SOLN
INTRAMUSCULAR | Status: AC
  Filled 2014-05-31: qty 1

## 2014-05-31 MED ORDER — GLYCOPYRROLATE 0.2 MG/ML IJ SOLN
0.2000 mg | Freq: Once | INTRAMUSCULAR | Status: AC
  Administered 2014-05-31: 0.2 mg via INTRAVENOUS

## 2014-05-31 MED ORDER — PROPOFOL 10 MG/ML IV BOLUS
INTRAVENOUS | Status: DC | PRN
  Administered 2014-05-31: 170 mg via INTRAVENOUS
  Administered 2014-05-31: 20 mg via INTRAVENOUS
  Administered 2014-05-31: 30 mg via INTRAVENOUS

## 2014-05-31 MED ORDER — SODIUM CHLORIDE 0.9 % IR SOLN
Status: DC | PRN
  Administered 2014-05-31 (×3)

## 2014-05-31 MED ORDER — BUPIVACAINE-EPINEPHRINE (PF) 0.5% -1:200000 IJ SOLN
INTRAMUSCULAR | Status: DC | PRN
  Administered 2014-05-31: 60 mL

## 2014-05-31 MED ORDER — LACTATED RINGERS IV SOLN
INTRAVENOUS | Status: DC
  Administered 2014-05-31: 07:00:00 via INTRAVENOUS

## 2014-05-31 MED ORDER — DEXAMETHASONE SODIUM PHOSPHATE 4 MG/ML IJ SOLN
4.0000 mg | Freq: Once | INTRAMUSCULAR | Status: AC
  Administered 2014-05-31: 4 mg via INTRAVENOUS

## 2014-05-31 MED ORDER — VANCOMYCIN HCL 10 G IV SOLR
1500.0000 mg | INTRAVENOUS | Status: AC
  Administered 2014-05-31: 1500 mg via INTRAVENOUS
  Filled 2014-05-31: qty 1500

## 2014-05-31 MED ORDER — SODIUM CHLORIDE 0.9 % IR SOLN
Status: DC | PRN
  Administered 2014-05-31: 1000 mL

## 2014-05-31 MED ORDER — LIDOCAINE HCL (CARDIAC) 10 MG/ML IV SOLN
INTRAVENOUS | Status: DC | PRN
  Administered 2014-05-31: 10 mg via INTRAVENOUS

## 2014-05-31 MED ORDER — PROPOFOL 10 MG/ML IV EMUL
INTRAVENOUS | Status: AC
  Filled 2014-05-31: qty 20

## 2014-05-31 MED ORDER — CHLORHEXIDINE GLUCONATE 4 % EX LIQD: 60.0000 mL | Freq: Once | CUTANEOUS | Status: DC

## 2014-05-31 MED ORDER — GLYCOPYRROLATE 0.2 MG/ML IJ SOLN
INTRAMUSCULAR | Status: AC
  Filled 2014-05-31: qty 2

## 2014-05-31 MED ORDER — SODIUM CHLORIDE 0.9 % IJ SOLN
INTRAMUSCULAR | Status: AC
  Filled 2014-05-31: qty 10

## 2014-05-31 MED ORDER — SUCCINYLCHOLINE CHLORIDE 20 MG/ML IJ SOLN
INTRAMUSCULAR | Status: AC
  Filled 2014-05-31: qty 1

## 2014-05-31 MED ORDER — HYDROCODONE-ACETAMINOPHEN 5-325 MG PO TABS: 2.0000 | ORAL_TABLET | Freq: Once | ORAL | Status: DC

## 2014-05-31 MED ORDER — ROCURONIUM BROMIDE 50 MG/5ML IV SOLN
INTRAVENOUS | Status: AC
  Filled 2014-05-31: qty 1

## 2014-05-31 MED ORDER — FENTANYL CITRATE 0.05 MG/ML IJ SOLN
25.0000 ug | INTRAMUSCULAR | Status: DC | PRN
  Administered 2014-05-31: 25 ug via INTRAVENOUS
  Filled 2014-05-31: qty 2

## 2014-05-31 MED ORDER — LIDOCAINE HCL (PF) 1 % IJ SOLN
INTRAMUSCULAR | Status: AC
  Filled 2014-05-31: qty 5

## 2014-05-31 MED ORDER — EPHEDRINE SULFATE 50 MG/ML IJ SOLN
INTRAMUSCULAR | Status: AC
  Filled 2014-05-31: qty 1

## 2014-05-31 MED ORDER — BUPIVACAINE-EPINEPHRINE (PF) 0.5% -1:200000 IJ SOLN
INTRAMUSCULAR | Status: AC
  Filled 2014-05-31: qty 30

## 2014-05-31 MED ORDER — SUCCINYLCHOLINE CHLORIDE 20 MG/ML IJ SOLN
INTRAMUSCULAR | Status: DC | PRN
  Administered 2014-05-31: 140 mg via INTRAVENOUS

## 2014-05-31 MED ORDER — BUPIVACAINE-EPINEPHRINE (PF) 0.5% -1:200000 IJ SOLN
INTRAMUSCULAR | Status: AC
  Filled 2014-05-31: qty 60

## 2014-05-31 MED ORDER — GLYCOPYRROLATE 0.2 MG/ML IJ SOLN
INTRAMUSCULAR | Status: AC
  Filled 2014-05-31: qty 1

## 2014-05-31 MED ORDER — GLYCOPYRROLATE 0.2 MG/ML IJ SOLN
INTRAMUSCULAR | Status: DC | PRN
  Administered 2014-05-31: 0.2 mg via INTRAVENOUS
  Administered 2014-05-31: 0.6 mg via INTRAVENOUS

## 2014-05-31 MED ORDER — EPINEPHRINE HCL 1 MG/ML IJ SOLN
INTRAMUSCULAR | Status: AC
  Filled 2014-05-31: qty 5

## 2014-05-31 MED ORDER — SEVOFLURANE IN SOLN
RESPIRATORY_TRACT | Status: AC
  Filled 2014-05-31: qty 250

## 2014-05-31 MED ORDER — ONDANSETRON HCL 4 MG/2ML IJ SOLN
4.0000 mg | Freq: Once | INTRAMUSCULAR | Status: AC
  Administered 2014-05-31: 4 mg via INTRAVENOUS

## 2014-05-31 MED ORDER — NEOSTIGMINE METHYLSULFATE 10 MG/10ML IV SOLN
INTRAVENOUS | Status: DC | PRN
  Administered 2014-05-31: 2 mg via INTRAVENOUS
  Administered 2014-05-31: 1 mg via INTRAVENOUS

## 2014-05-31 MED ORDER — ROCURONIUM BROMIDE 100 MG/10ML IV SOLN
INTRAVENOUS | Status: DC | PRN
  Administered 2014-05-31: 20 mg via INTRAVENOUS
  Administered 2014-05-31 (×2): 5 mg via INTRAVENOUS

## 2014-05-31 MED ORDER — MIDAZOLAM HCL 2 MG/2ML IJ SOLN
INTRAMUSCULAR | Status: AC
  Filled 2014-05-31: qty 2

## 2014-05-31 MED ORDER — ONDANSETRON HCL 4 MG/2ML IJ SOLN: 4.0000 mg | Freq: Once | INTRAMUSCULAR | Status: DC | PRN

## 2014-05-31 MED ORDER — ONDANSETRON HCL 4 MG/2ML IJ SOLN
INTRAMUSCULAR | Status: AC
  Filled 2014-05-31: qty 2

## 2014-05-31 MED ORDER — EPHEDRINE SULFATE 50 MG/ML IJ SOLN
INTRAMUSCULAR | Status: DC | PRN
  Administered 2014-05-31: 5 mg via INTRAVENOUS
  Administered 2014-05-31: 10 mg via INTRAVENOUS
  Administered 2014-05-31 (×4): 5 mg via INTRAVENOUS

## 2014-05-31 MED ORDER — FENTANYL CITRATE 0.05 MG/ML IJ SOLN
INTRAMUSCULAR | Status: AC
  Filled 2014-05-31: qty 5

## 2014-05-31 MED ORDER — PROMETHAZINE HCL 12.5 MG PO TABS: 12.5000 mg | ORAL_TABLET | Freq: Four times a day (QID) | ORAL | Status: DC | PRN

## 2014-05-31 MED ORDER — KETOROLAC TROMETHAMINE 30 MG/ML IJ SOLN: 30.0000 mg | Freq: Once | INTRAMUSCULAR | Status: DC

## 2014-05-31 SURGICAL SUPPLY — 48 items
BAG HAMPER (MISCELLANEOUS) ×3 IMPLANT
BANDAGE ELASTIC 6 VELCRO NS (GAUZE/BANDAGES/DRESSINGS) ×3 IMPLANT
BLADE 11 SAFETY STRL DISP (BLADE) ×3 IMPLANT
BLADE AGGRESSIVE PLUS 4.0 (BLADE) ×3 IMPLANT
CHLORAPREP W/TINT 26ML (MISCELLANEOUS) ×4 IMPLANT
CLOTH BEACON ORANGE TIMEOUT ST (SAFETY) ×3 IMPLANT
COOLER CRYO IC GRAV AND TUBE (ORTHOPEDIC SUPPLIES) ×3 IMPLANT
CUFF CRYO KNEE18X23 MED (MISCELLANEOUS) ×2 IMPLANT
CUFF TOURNIQUET SINGLE 34IN LL (TOURNIQUET CUFF) ×2 IMPLANT
DECANTER SPIKE VIAL GLASS SM (MISCELLANEOUS) ×4 IMPLANT
GAUZE SPONGE 4X4 12PLY STRL (GAUZE/BANDAGES/DRESSINGS) ×5 IMPLANT
GAUZE SPONGE 4X4 16PLY XRAY LF (GAUZE/BANDAGES/DRESSINGS) ×3 IMPLANT
GAUZE XEROFORM 5X9 LF (GAUZE/BANDAGES/DRESSINGS) ×3 IMPLANT
GLOVE BIOGEL PI IND STRL 7.0 (GLOVE) IMPLANT
GLOVE BIOGEL PI INDICATOR 7.0 (GLOVE) ×2
GLOVE ECLIPSE 6.5 STRL STRAW (GLOVE) ×2 IMPLANT
GLOVE EXAM NITRILE LRG STRL (GLOVE) ×2 IMPLANT
GLOVE SKINSENSE NS SZ8.0 LF (GLOVE) ×2
GLOVE SKINSENSE STRL SZ8.0 LF (GLOVE) ×1 IMPLANT
GLOVE SS N UNI LF 8.5 STRL (GLOVE) ×3 IMPLANT
GOWN STRL REUS W/TWL LRG LVL3 (GOWN DISPOSABLE) ×5 IMPLANT
GOWN STRL REUS W/TWL XL LVL3 (GOWN DISPOSABLE) ×3 IMPLANT
HLDR LEG FOAM (MISCELLANEOUS) ×1 IMPLANT
IV NS IRRIG 3000ML ARTHROMATIC (IV SOLUTION) ×8 IMPLANT
KIT BLADEGUARD II DBL (SET/KITS/TRAYS/PACK) ×3 IMPLANT
KIT ROOM TURNOVER AP CYSTO (KITS) ×3 IMPLANT
LEG HOLDER FOAM (MISCELLANEOUS) ×2
MANIFOLD NEPTUNE II (INSTRUMENTS) ×3 IMPLANT
MARKER SKIN DUAL TIP RULER LAB (MISCELLANEOUS) ×3 IMPLANT
NDL HYPO 18GX1.5 BLUNT FILL (NEEDLE) ×1 IMPLANT
NDL HYPO 21X1.5 SAFETY (NEEDLE) ×1 IMPLANT
NDL SPNL 18GX3.5 QUINCKE PK (NEEDLE) ×1 IMPLANT
NEEDLE HYPO 18GX1.5 BLUNT FILL (NEEDLE) ×3 IMPLANT
NEEDLE HYPO 21X1.5 SAFETY (NEEDLE) ×3 IMPLANT
NEEDLE SPNL 18GX3.5 QUINCKE PK (NEEDLE) ×3 IMPLANT
NS IRRIG 1000ML POUR BTL (IV SOLUTION) ×3 IMPLANT
PACK ARTHRO LIMB DRAPE STRL (MISCELLANEOUS) ×3 IMPLANT
PAD ABD 5X9 TENDERSORB (GAUZE/BANDAGES/DRESSINGS) ×3 IMPLANT
PAD ARMBOARD 7.5X6 YLW CONV (MISCELLANEOUS) ×3 IMPLANT
PADDING CAST COTTON 6X4 STRL (CAST SUPPLIES) ×3 IMPLANT
SET ARTHROSCOPY INST (INSTRUMENTS) ×3 IMPLANT
SET ARTHROSCOPY PUMP TUBE (IRRIGATION / IRRIGATOR) ×3 IMPLANT
SET BASIN LINEN APH (SET/KITS/TRAYS/PACK) ×3 IMPLANT
SUT ETHILON 3 0 FSL (SUTURE) ×2 IMPLANT
SYR 30ML LL (SYRINGE) ×3 IMPLANT
SYRINGE 12CC LL (MISCELLANEOUS) ×3 IMPLANT
WAND 50 DEG COVAC W/CORD (SURGICAL WAND) ×2 IMPLANT
YANKAUER SUCT BULB TIP 10FT TU (MISCELLANEOUS) ×11 IMPLANT

## 2014-05-31 NOTE — Anesthesia Postprocedure Evaluation (Signed)
  Anesthesia Post-op Note  Patient: Brian [name]  Procedure(s) Performed: Procedure(s): LEFT KNEE ARTHROSCOPY WITH MEDIAL MENISECTOMY (Left)  Patient Location: Short Stay  Anesthesia Type:General  Level of Consciousness: awake and patient cooperative  Airway and Oxygen Therapy: Patient Spontanous Breathing  Post-op Pain: mild  Post-op Assessment: Post-op Vital signs reviewed  Post-op Vital Signs: Reviewed and stable  Last Vitals:  Filed Vitals:   05/31/14 0932  BP: 135/72  Pulse: 65  Temp: 36.8 C  Resp: 20    Complications: No apparent anesthesia complications

## 2014-05-31 NOTE — Anesthesia Postprocedure Evaluation (Signed)
  Anesthesia Post-op Note  Patient: Brian [name]  Procedure(s) Performed: Procedure(s): LEFT KNEE ARTHROSCOPY WITH MEDIAL MENISECTOMY (Left)  Patient Location: PACU  Anesthesia Type:General  Level of Consciousness: awake and patient cooperative  Airway and Oxygen Therapy: Patient Spontanous Breathing and non-rebreather face mask  Post-op Pain: none  Post-op Assessment: Post-op Vital signs reviewed, Patient's Cardiovascular Status Stable, Respiratory Function Stable and Patent Airway  Post-op Vital Signs: Reviewed and stable  Last Vitals:  Filed Vitals:   05/31/14 0853  BP: 146/76  Pulse: 73  Temp: 36.8 C  Resp: 28    Complications: No apparent anesthesia complications

## 2014-05-31 NOTE — Anesthesia Preprocedure Evaluation (Signed)
Anesthesia Evaluation  Patient identified by MRN, date of birth, ID band Patient awake    Reviewed: Allergy & Precautions, H&P , NPO status , Patient's Chart, lab work & pertinent test results, reviewed documented beta blocker date and time   History of Anesthesia Complications (+) PONV and history of anesthetic complications  Airway Mallampati: II TM Distance: >3 FB Neck ROM: Full    Dental  (+) Teeth Intact   Pulmonary sleep apnea and Continuous Positive Airway Pressure Ventilation ,  breath sounds clear to auscultation        Cardiovascular hypertension, Pt. on medications and Pt. on home beta blockers + Valvular Problems/Murmurs AI Rhythm:Regular Rate:Normal     Neuro/Psych    GI/Hepatic negative GI ROS,   Endo/Other  diabetes, Well Controlled, Type 2, Oral Hypoglycemic AgentsMorbid obesity  Renal/GU      Musculoskeletal   Abdominal   Peds  Hematology   Anesthesia Other Findings   Reproductive/Obstetrics                           Anesthesia Physical Anesthesia Plan  ASA: III  Anesthesia Plan: General   Post-op Pain Management:    Induction: Intravenous, Rapid sequence and Cricoid pressure planned  Airway Management Planned: Oral ETT  Additional Equipment:   Intra-op Plan:   Post-operative Plan: Extubation in OR  Informed Consent: I have reviewed the patients History and Physical, chart, labs and discussed the procedure including the risks, benefits and alternatives for the proposed anesthesia with the patient or authorized representative who has indicated his/her understanding and acceptance.     Plan Discussed with:   Anesthesia Plan Comments:         Anesthesia Quick Evaluation

## 2014-05-31 NOTE — Anesthesia Procedure Notes (Signed)
Procedure Name: Intubation Date/Time: 05/31/2014 7:45 AM Performed by: Vista Deck Pre-anesthesia Checklist: Patient identified, Patient being monitored, Timeout performed, Emergency Drugs available and Suction available Patient Re-evaluated:Patient Re-evaluated prior to inductionOxygen Delivery Method: Circle System Utilized Preoxygenation: Pre-oxygenation with 100% oxygen Intubation Type: IV induction Ventilation: Mask ventilation without difficulty Laryngoscope Size: Miller and 2 Grade View: Grade I Tube type: Oral Tube size: 7.0 mm Number of attempts: 1 Airway Equipment and Method: stylet and Oral airway Placement Confirmation: ETT inserted through vocal cords under direct vision,  positive ETCO2 and breath sounds checked- equal and bilateral Secured at: 22 cm Tube secured with: Tape Dental Injury: Teeth and Oropharynx as per pre-operative assessment

## 2014-05-31 NOTE — Interval H&P Note (Signed)
History and Physical Interval Note:  05/31/2014 7:23 AM  Brian [name]  has presented today for surgery, with the diagnosis of left medial and lateral meniscus tears  The various methods of treatment have been discussed with the patient and family. After consideration of risks, benefits and other options for treatment, the patient has consented to  Procedure(s): LEFT KNEE ARTHROSCOPY WITH MEDIAL AND LATERAL MENISECTOMY (Left) as a surgical intervention .  The patient's history has been reviewed, patient examined, no change in status, stable for surgery.  I have reviewed the patient's chart and labs.  Questions were answered to the patient's satisfaction.     Arther Abbott

## 2014-05-31 NOTE — Op Note (Signed)
Preop diagnosis torn lateral meniscus and torn medial meniscus right  knee  Postop diagnosis torn medial meniscus and osteoarthritis Procedure arthroscopy right knee partial medial meniscectomy  Surgeon Aline Brochure  Anesthesia Gen.  Operative findings : MEDIAL medial compartment arthritis, torn medial meniscus, grade 3 changes medial femoral condyle  LATERAL normal  PATELLA grade 1 patellofemoral changes  TROCHLEA grade 1 patellofemoral changes   Indications for procedure pain mechanical symptoms unresponsive to nonoperative treatment  The patient was identified in the preop holding area as Brian [name] the right knee was confirmed as a surgical site and marked. The chart was reviewed  The patient was taken to the operating room and  was given appropriate preoperative antibiotic vancomycin because of penicillin allergy and general anesthesia was administered. The operative leg (right) was placed in the arthroscopic leg holder, the well leg was placed in a well leg holder  The right leg was then prepped and draped sterile The surgical site was confirmed and the timeout procedure was completed  The lateral portal was injected with Marcaine with epinephrine solution and a stab wound was made. The scope was placed in the lateral portal into the medial compartment. The  Diagnostic portion of the  arthroscopy was completed. A medial portal was established in the same fashion and a probe was placed into the joint. The diagnostic arthroscopy   was repeated using a probe to palpate intra-articular structures   A duckbill forceps was used to morcellized the meniscal tear, the fragments were removed with a motorized shaver. The meniscus was then balanced with an ArthroCare wand 50 probe.  A probe was then used to confirm a stable rim.  The knee was irrigated meniscal fragments remaining were removed. The portals were closed with 3-0 nylon suture. The knee joint was then injected with 45 cc of  Marcaine with epinephrine. A sterile dressing was applied followed by an Ace bandage and a Cryo/Cuff.  The patient was extubated and taken to the recovery room in stable condition.

## 2014-05-31 NOTE — Brief Op Note (Signed)
05/31/2014  8:40 AM  PATIENT:  Brian [name]  73 y.o. male  PRE-OPERATIVE DIAGNOSIS:  left medial and lateral meniscal tears  POST-OPERATIVE DIAGNOSIS:  left knee medial meniscus tear; osteoarthritis  PROCEDURE:  Procedure(s): LEFT KNEE ARTHROSCOPY WITH MEDIAL MENISECTOMY (Left) Operative findings degenerative arthritis was noted in the medial compartment especially on the medial femoral condyle with grade 3 changes based on the diffuse cartilaginous changes. There is a torn medial meniscus posterior horn complex tear with degenerative and acute components  The anterior cruciate ligament is intact. PCL intact.  Lateral meniscus intact. Lateral condyle normal medial condyle tibia normal  Patellofemoral joint mild degenerative changes grade 1 chondromalacia  Synovitis   SURGEON:  Surgeon(s) and Role:    * Carole Civil, MD - Primary  PHYSICIAN ASSISTANT:   ASSISTANTS: none   ANESTHESIA:   general  EBL:  Total I/O In: 900 [I.V.:900] Out: 0   BLOOD ADMINISTERED:none  DRAINS: none   LOCAL MEDICATIONS USED:  MARCAINE    and Amount: 60  ml  SPECIMEN:  No Specimen  DISPOSITION OF SPECIMEN:  N/A  COUNTS:  YES  TOURNIQUET:    DICTATION: .Dragon Dictation  PLAN OF CARE: Discharge to home after PACU  PATIENT DISPOSITION:  PACU - hemodynamically stable.   Delay start of Pharmacological VTE agent (>24hrs) due to surgical blood loss or risk of bleeding: not applicable

## 2014-05-31 NOTE — Transfer of Care (Signed)
Immediate Anesthesia Transfer of Care Note  Patient: Brian [name]  Procedure(s) Performed: Procedure(s) (LRB): LEFT KNEE ARTHROSCOPY WITH MEDIAL MENISECTOMY (Left)  Patient Location: PACU  Anesthesia Type: General  Level of Consciousness: awake  Airway & Oxygen Therapy: Patient Spontanous Breathing and non-rebreather face mask  Post-op Assessment: Report given to PACU RN, Post -op Vital signs reviewed and stable and Patient moving all extremities  Post vital signs: Reviewed and stable  Complications: No apparent anesthesia complications

## 2014-05-31 NOTE — Addendum Note (Signed)
Addendum created 05/31/14 4627 by Vista Deck, CRNA   Modules edited: Notes Section   Notes Section:  File: [id number]

## 2014-05-31 NOTE — Discharge Instructions (Signed)
Remove dressing Sat apply 2 bandaids and reapply the ace wrap  Use ice cuff until you see the doctor; remove it when walking   On Sunday start bending your knee 25 times , 3 times a day

## 2014-06-03 ENCOUNTER — Ambulatory Visit (INDEPENDENT_AMBULATORY_CARE_PROVIDER_SITE_OTHER): Payer: Medicare Other | Admitting: Orthopedic Surgery

## 2014-06-03 ENCOUNTER — Ambulatory Visit: Payer: Medicare Other | Admitting: Orthopedic Surgery

## 2014-06-03 VITALS — BP 169/91 | Ht 71.5 in | Wt 276.0 lb

## 2014-06-03 DIAGNOSIS — Z9889 Other specified postprocedural states: Secondary | ICD-10-CM

## 2014-06-03 DIAGNOSIS — Z96652 Presence of left artificial knee joint: Secondary | ICD-10-CM | POA: Insufficient documentation

## 2014-06-03 NOTE — Patient Instructions (Signed)
Call to arrange therapy 

## 2014-06-03 NOTE — Progress Notes (Signed)
Chief Complaint  Patient presents with  . Follow-up    post op #1 SALK, DOS 05/31/14     Preop diagnosis torn lateral meniscus and torn medial meniscus right  knee  Postop diagnosis torn medial meniscus and osteoarthritis Procedure arthroscopy right knee partial medial meniscectomy  Surgeon Aline Brochure  Anesthesia Gen.  Operative findings : MEDIAL medial compartment arthritis, torn medial meniscus, grade 3 changes medial femoral condyle  LATERAL normal  PATELLA grade 1 patellofemoral changes  TROCHLEA grade 1 patellofemoral changes   The patient had some swallowing issues that he called me about Friday. He said that it was hard for him to swallow but was not short of breath. He said he did not make saliva well and was wondering what that might be coming from. This resolved once he stopped taking the Phenergan  He comes in today with complaint portals minimal swelling 100 of knee flexion soft gastroc  Start therapy Return 4 weeks

## 2014-06-11 ENCOUNTER — Ambulatory Visit (HOSPITAL_COMMUNITY)
Admission: RE | Admit: 2014-06-11 | Discharge: 2014-06-11 | Disposition: A | Discharge status: Home / Self Care | Payer: Medicare Other | Source: Ambulatory Visit | Attending: Orthopedic Surgery | Admitting: Orthopedic Surgery

## 2014-06-11 DIAGNOSIS — IMO0001 Reserved for inherently not codable concepts without codable children: Secondary | ICD-10-CM | POA: Insufficient documentation

## 2014-06-11 DIAGNOSIS — R269 Unspecified abnormalities of gait and mobility: Secondary | ICD-10-CM | POA: Insufficient documentation

## 2014-06-11 DIAGNOSIS — M25669 Stiffness of unspecified knee, not elsewhere classified: Secondary | ICD-10-CM | POA: Diagnosis not present

## 2014-06-11 DIAGNOSIS — M25569 Pain in unspecified knee: Secondary | ICD-10-CM | POA: Diagnosis not present

## 2014-06-11 DIAGNOSIS — M25562 Pain in left knee: Secondary | ICD-10-CM

## 2014-06-11 NOTE — Evaluation (Addendum)
Physical Therapy Evaluation  Patient Details  Name: Brian [name] MRN: [id number] Date of Birth: [birth year]  Today's Date: 06/11/2014 Time: 1530-1600 PT Time Calculation (min): 30 min    Charges: 1 evaluation          Visit#: 1 of 16  Re-eval: 07/11/14 Assessment Diagnosis: Lt knee  Next MD Visit: Aline Brochure September Prior Therapy: no  Authorization: Medicare     Past Medical History:  Past Medical History  Diagnosis Date  . Atypical chest pain     Low-risk exercise stress Cardiolite EF 60% April 2008. Reportedly normal remote cardiac catheterization @ [place] EF 60%...nuclear....2008  . Mild aortic regurgitation with LV dilation by prior echocardiogram   . Thrombocytopenia     mild follow Dr. Alonna Minium  . Obstructive sleep apnea     C-Pap compliant  . Hypertension   . Dyslipidemia   . Obesity   . Vertigo     Hospital 03/2010  . Bradycardia     hospital 03/2010 stopped beta blocker  . Complication of anesthesia   . PONV (postoperative nausea and vomiting)   . Diabetes mellitus without complication    Past Surgical History:  Past Surgical History  Procedure Laterality Date  . Cardiac catheterization    . Status post bilateral arthroscopic knee surgery    . Knee arthroscopy with medial menisectomy Left 05/31/2014    Procedure: LEFT KNEE ARTHROSCOPY WITH MEDIAL MENISECTOMY;  Surgeon: Carole Civil, MD;  Location: [facility];  Service: Orthopedics;  Laterality: Left;    Subjective Symptoms/Limitations Symptoms: Pain: alon medial side. Hamstring cramping Patient staates he is not sure why he is here and is unsure if phsyical therapy can help him.  Pertinent History: Knee arthroscopic surgery 04/30/14 Lt knee for medial meniiscus tear. occured while mowing lawn on slinated surface, knee hurt  the following day.  Patient Stated Goals: Patient doesnt think he needs PT, dificulty squatting to the floor.  Pain Assessment Currently in Pain?: Yes Pain Score: 2  Pain Location:  Knee Pain Orientation: Left Pain Type: Surgical pain Pain Onset: 1 to 4 weeks ago Pain Frequency: Intermittent Effect of Pain on Daily Activities: walking and swimming  Cognition/Observation Observation/Other Assessments Observations: Gait: abnormal gait incliding limited stride length, early heel off, limited tibial/femoral interal rotation, excessive varus moment, excessive toe out, limited calcaneal movemnt throughtout gait, limited hib transverseplane movement throughout, limit4ed knee extenison  Sensation/Coordination/Flexibility/Functional Tests Flexibility Thomas: Positive 90/90: Positive  Assessment LLE AROM (degrees) Left Hip Extension: 0 Left Hip Flexion: 100 Left Hip External Rotation : 44 Left Hip Internal Rotation : 26 Left Knee Extension: -6 Left Knee Flexion: 134 Left Ankle Dorsiflexion: 6 LLE Strength Left Hip Extension: 3+/5 Left Hip ABduction: 3+/5 Left Knee Flexion: 3+/5  Physical Therapy Assessment and Plan PT Assessment and Plan Clinical Impression Statement: Patient displays Lt knee pain s/p arthroscopic surgery attributed to abnormal gait pattern placing knee under abnormal loadign forces during ambulation Patient will benefit from skilled phsyical therapy to improve knee mechanics and decrease risk of future injruy to bilateral knees.  Pt will benefit from skilled therapeutic intervention in order to improve on the following deficits: Abnormal gait;Decreased activity tolerance;Decreased balance;Decreased coordination;Decreased strength;Pain;Impaired flexibility;Increased muscle spasms;Decreased range of motion;Improper body mechanics;Decreased mobility;Increased fascial restricitons Rehab Potential: Good Clinical Impairments Affecting Rehab Potential: highly motivated PT Frequency: Min 2X/week PT Duration: 8 weeks PT Treatment/Interventions: Gait training;Stair training;Functional mobility training;Therapeutic activities;Therapeutic exercise;Balance  training;Neuromuscular re-education;Modalities;Manual techniques;Patient/family education PT Plan: Iniitial focus on introducing strentches and utilizing manual techniques  to increase LE mobility and decrease pain. As mobility increases focus to shift towards strengthening    Goals Home Exercise Program Pt/caregiver will Perform Home Exercise Program: For increased ROM PT Goal: Perform Home Exercise Program - Progress: Goal set today PT Short Term Goals Time to Complete Short Term Goals: 4 weeks PT Short Term Goal 1: Patient will display increased knee extnsion to >-4 degrees from full extension to increase stride length during gait PT Short Term Goal 2: patient will increase hip extenion mobility to >15 degrees to  to increase stride length during gait PT Short Term Goal 3: Patient will increase ankle dosiflexion mobility to >15 degrees to increased stride length during gait PT Short Term Goal 4: Patient will increase hip internal rotation mobility to 40 degrees to increased patient ability to decelerate during loading phase of gait.  PT Short Term Goal 5: Patient will icnrease straight leg raise to >75 degrees to indicate improved hamstring mobility so patient can bend down to touch toes.  PT Long Term Goals Time to Complete Long Term Goals: 8 weeks PT Long Term Goal 1: Patient will increase glut max/med strength to 4/5MMT so patient can squat to chair withotu UE support PT Long Term Goal 2: patient will demsontrate increased hamstring strength to 4+/5 so patient can better control knee loading durign gait to decrease risk of futrue meniscus injuries.  Long Term Goal 3: Patient will increase glut max/med strength to 5/5MMT so patient can squat to ground without UE support to be able to work in garden  Problem List Patient Active Problem List   Diagnosis Date Noted  . Stiffness of joint, not elsewhere classified, lower leg 06/11/2014  . Pain in joint, lower leg 06/11/2014  . Abnormality of  gait 06/11/2014  . S/P left knee arthroscopy 06/03/2014  . Derangement of posterior horn of medial meniscus 04/09/2014  . OA (osteoarthritis) of knee 04/09/2014  . Knee pain 04/09/2014  . THROMBOCYTOPENIA 05/12/2010  . AORTIC INSUFFICIENCY 05/12/2010  . OBESITY, UNSPECIFIED 05/11/2010  . HYPERTENSION 05/11/2010  . BRADYCARDIA 05/11/2010  . VERTIGO 05/11/2010  . UNSPECIFIED SLEEP APNEA 05/11/2010  . CHEST PAIN UNSPECIFIED 05/11/2010    PT - End of Session Activity Tolerance: Patient tolerated treatment well General Behavior During Therapy: WFL for tasks assessed/performed PT Plan of Care PT Home Exercise Plan: 3 way hamstrign stretch  GP GP  Functional Assessment Tool Used: FOTO 47% limited  Functional Limitation: Mobility: Walking and moving around  Mobility: Walking and Moving Around Current Status ([id number]): At least 40 percent but less than 60 percent impaired, limited or restricted  Mobility: Walking and Moving Around Goal Status [phone]): At least 20 percent but less than 40 percent impaired, limited or restricted  Leia Alf 06/11/2014, 7:48 PM  Physician Documentation Your signature is required to indicate approval of the treatment plan as stated above.  Please sign and either send electronically or make a copy of this report for your files and return this physician signed original.   Please mark one 1.__approve of plan  2. ___approve of plan with the following conditions.   ______________________________                                                          _____________________ Physician Signature  Date SBNR 

## 2014-06-17 DIAGNOSIS — E119 Type 2 diabetes mellitus without complications: Secondary | ICD-10-CM | POA: Diagnosis not present

## 2014-06-17 DIAGNOSIS — N400 Enlarged prostate without lower urinary tract symptoms: Secondary | ICD-10-CM | POA: Diagnosis not present

## 2014-06-17 DIAGNOSIS — I10 Essential (primary) hypertension: Secondary | ICD-10-CM | POA: Diagnosis not present

## 2014-06-20 ENCOUNTER — Ambulatory Visit (HOSPITAL_COMMUNITY): Payer: Medicare Other

## 2014-06-21 DIAGNOSIS — L821 Other seborrheic keratosis: Secondary | ICD-10-CM | POA: Diagnosis not present

## 2014-06-21 DIAGNOSIS — L259 Unspecified contact dermatitis, unspecified cause: Secondary | ICD-10-CM | POA: Diagnosis not present

## 2014-06-21 DIAGNOSIS — L02219 Cutaneous abscess of trunk, unspecified: Secondary | ICD-10-CM | POA: Diagnosis not present

## 2014-06-26 ENCOUNTER — Ambulatory Visit (HOSPITAL_COMMUNITY)
Admission: RE | Admit: 2014-06-26 | Discharge: 2014-06-26 | Disposition: A | Discharge status: Home / Self Care | Payer: Medicare Other | Source: Ambulatory Visit | Attending: Internal Medicine | Admitting: Internal Medicine

## 2014-06-26 DIAGNOSIS — IMO0001 Reserved for inherently not codable concepts without codable children: Secondary | ICD-10-CM | POA: Diagnosis not present

## 2014-06-26 DIAGNOSIS — R269 Unspecified abnormalities of gait and mobility: Secondary | ICD-10-CM | POA: Insufficient documentation

## 2014-06-26 DIAGNOSIS — M25569 Pain in unspecified knee: Secondary | ICD-10-CM | POA: Diagnosis not present

## 2014-06-26 DIAGNOSIS — M25669 Stiffness of unspecified knee, not elsewhere classified: Secondary | ICD-10-CM | POA: Insufficient documentation

## 2014-06-26 NOTE — Progress Notes (Signed)
Physical Therapy Treatment Patient Details  Name: Brian [name] MRN: [id number] Date of Birth: 10-May-1941  Today's Date: 06/26/2014 Time: 9381-0175 PT Time Calculation (min): 38 min  Visit#: 2 of 16  Re-eval: 07/11/14 Authorization: Medicare  Authorization Visit#: 2 of 10  Charges:  therex 38  Subjective: Symptoms/Limitations Symptoms: Pt states he has no pain currently.    States both knees are stiff and he has general hamstring tightness but no cramping. Pain Assessment Currently in Pain?: No/denies   Exercise/Treatments Stretches Active Hamstring Stretch: 3 reps;20 seconds;Limitations Active Hamstring Stretch Limitations: 3 way on 14" box bilaterally Hip Flexor Stretch: 3 reps;20 seconds;Limitations Hip Flexor Stretch Limitations: 14" box 3 way Knee: Self-Stretch to increase Flexion: 3 reps;20 seconds;Limitations Knee: Self-Stretch Limitations: groin stretch Gastroc Stretch: 3 reps;20 seconds;Limitations Gastroc Stretch Limitations: slant board and at wall for HEP instruction Standing Heel Raises: 10 reps;Limitations Heel Raises Limitations: toeraises 10 reps Rocker Board: 2 minutes;Limitations Rocker Board Limitations: A/P, R/L     Physical Therapy Assessment and Plan PT Assessment and Plan Clinical Impression Statement: Focus on LE stretching today.  Added gastroc, hip flexor and groin stretch and instructed to add to HEP.  Noted tightness in bilateral LE musculature with stretches.  began gentle stabilization in LE's with rockerboard, heel and toe raises.  No complaints of pain with any exercises/actvities.   PT Plan: Iniitial focus on introducing strentches and utilizing manual techniques to increase LE mobility and decrease pain. As mobility increases focus to shift towards strengthening.  Complete FOTO next session as it was not done at initiatl evaluation.     Problem List Patient Active Problem List   Diagnosis Date Noted  . Stiffness of joint, not  elsewhere classified, lower leg 06/11/2014  . Pain in joint, lower leg 06/11/2014  . Abnormality of gait 06/11/2014  . S/P left knee arthroscopy 06/03/2014  . Derangement of posterior horn of medial meniscus 04/09/2014  . OA (osteoarthritis) of knee 04/09/2014  . Knee pain 04/09/2014  . THROMBOCYTOPENIA 05/12/2010  . AORTIC INSUFFICIENCY 05/12/2010  . OBESITY, UNSPECIFIED 05/11/2010  . HYPERTENSION 05/11/2010  . BRADYCARDIA 05/11/2010  . VERTIGO 05/11/2010  . UNSPECIFIED SLEEP APNEA 05/11/2010  . CHEST PAIN UNSPECIFIED 05/11/2010    PT - End of Session Activity Tolerance: Patient tolerated treatment well General Behavior During Therapy: [facility] for tasks assessed/performed PT Plan of Care PT Home Exercise Plan: 3 way hamstrign stretch  GP Functional Assessment Tool Used: FOTO, to be performed next session  Teena Irani, PTA/CLT 06/26/2014, 9:36 AM

## 2014-06-28 ENCOUNTER — Ambulatory Visit (HOSPITAL_COMMUNITY)
Admission: RE | Admit: 2014-06-28 | Discharge: 2014-06-28 | Disposition: A | Discharge status: Home / Self Care | Payer: Medicare Other | Source: Ambulatory Visit | Attending: Orthopedic Surgery | Admitting: Orthopedic Surgery

## 2014-06-28 DIAGNOSIS — IMO0001 Reserved for inherently not codable concepts without codable children: Secondary | ICD-10-CM | POA: Diagnosis not present

## 2014-06-28 NOTE — Progress Notes (Signed)
Physical Therapy Treatment Patient Details  Name: Brian [name] MRN: [id number] Date of Birth: 1940/11/13  Today's Date: 06/28/2014 Time: 1100-1145 PT Time Calculation (min): 45 min   Charges: Manual therapy 1100-1110, TE 1110-1145 Visit#: 3 of 16  Re-eval: 07/11/14   Authorization: Medicare  Authorization Visit#: 3 of 10   Subjective: Symptoms/Limitations Symptoms: Patient notes increased Lt knee pain following last session. aptient notes that he initally felt great after last session, but his knee pain flaired up later that night.  Pain Assessment Currently in Pain?: Yes Pain Score: 7   Exercise/Treatments Stretches Active Hamstring Stretch Limitations: 10x 3" with internal rotation drive standing to 14",  Hip Flexor Stretch Limitations: 14" box with internal rotation focus ITB Stretch: Limitations ITB Stretch Limitations: 10x 3" to 8" Standing Functional Squat: Limitations Functional Squat Limitations: squat walk around 10x Other Standing Knee Exercises: 3D hip excusions 10x (neutral and split stance) Other Standing Knee Exercises: 2D walking hip excursion30 ft  Manual Therapy Manual Therapy: Myofascial release Myofascial Release: quadraceps  grade 3 anterior-posterior and posterior-anterior mobilization of knee to increase joint flexion/extension mobility   Physical Therapy Assessment and Plan PT Assessment and Plan Clinical Impression Statement: patient arrives with increased pain secondary to contineud abnormal gait with excessive toe out, limited stride length, and excessive varus moment at the knee durign foot strike throguhout gait. Follwoign therex to asddress the se limitations patient demosntrated decressed pain, noting that he felt much better.  Manual therapy performed initially to decrease pain.  PT Plan: Continue focus on improvign gait with low level strengtheing exercises following mobility exercises to improve utilization of new mobility.     Goals PT  Short Term Goals PT Short Term Goal 1: Patient will display increased knee extnsion to >-4 degrees from full extension to increase stride length during gait PT Short Term Goal 1 - Progress: Progressing toward goal PT Short Term Goal 2: patient will increase hip extenion mobility to >15 degrees to  to increase stride length during gait PT Short Term Goal 2 - Progress: Progressing toward goal PT Short Term Goal 3: Patient will increase ankle dosiflexion mobility to >15 degrees to increased stride length during gait PT Short Term Goal 3 - Progress: Progressing toward goal PT Short Term Goal 4: Patient will increase hip internal rotation mobility to 40 degrees to increased patient ability to decelerate during loading phase of gait.  PT Short Term Goal 4 - Progress: Progressing toward goal PT Short Term Goal 5: Patient will icnrease straight leg raise to >75 degrees to indicate improved hamstring mobility so patient can bend down to touch toes.  PT Short Term Goal 5 - Progress: Progressing toward goal  Problem List Patient Active Problem List   Diagnosis Date Noted  . Stiffness of joint, not elsewhere classified, lower leg 06/11/2014  . Pain in joint, lower leg 06/11/2014  . Abnormality of gait 06/11/2014  . S/P left knee arthroscopy 06/03/2014  . Derangement of posterior horn of medial meniscus 04/09/2014  . OA (osteoarthritis) of knee 04/09/2014  . Knee pain 04/09/2014  . THROMBOCYTOPENIA 05/12/2010  . AORTIC INSUFFICIENCY 05/12/2010  . OBESITY, UNSPECIFIED 05/11/2010  . HYPERTENSION 05/11/2010  . BRADYCARDIA 05/11/2010  . VERTIGO 05/11/2010  . UNSPECIFIED SLEEP APNEA 05/11/2010  . CHEST PAIN UNSPECIFIED 05/11/2010    PT - End of Session Activity Tolerance: Patient tolerated treatment well General Behavior During Therapy: WFL for tasks assessed/performed  GP Functional Assessment Tool Used: FOTO 47% limited Functional Limitation: Mobility: Walking and  moving around Mobility:  Walking and Moving Around Current Status [phone]): At least 40 percent but less than 60 percent impaired, limited or restricted Mobility: Walking and Moving Around Goal Status [phone]): At least 20 percent but less than 40 percent impaired, limited or restricted  Simmone Cape R 06/28/2014, 11:54 AM

## 2014-07-02 ENCOUNTER — Ambulatory Visit (INDEPENDENT_AMBULATORY_CARE_PROVIDER_SITE_OTHER): Payer: Medicare Other | Admitting: Orthopedic Surgery

## 2014-07-02 VITALS — BP 182/105 | Ht 71.5 in | Wt 276.0 lb

## 2014-07-02 DIAGNOSIS — Z9889 Other specified postprocedural states: Secondary | ICD-10-CM

## 2014-07-02 DIAGNOSIS — M23322 Other meniscus derangements, posterior horn of medial meniscus, left knee: Secondary | ICD-10-CM

## 2014-07-02 DIAGNOSIS — M23329 Other meniscus derangements, posterior horn of medial meniscus, unspecified knee: Secondary | ICD-10-CM

## 2014-07-02 NOTE — Progress Notes (Signed)
Chief Complaint  Patient presents with  . Follow-up    post op #2, SALK, DOS 05/31/14    He continues to do well he is having some issues with his therapy as there being a little too vigorous. He will continue his therapy at home.  He has no swelling minimal tenderness he's regained almost all of his range of motion and is walking without support  Followup in a month continue exercises at home

## 2014-07-03 ENCOUNTER — Ambulatory Visit (HOSPITAL_COMMUNITY): Payer: Medicare Other | Admitting: Physical Therapy

## 2014-07-03 ENCOUNTER — Telehealth (HOSPITAL_COMMUNITY): Payer: Self-pay

## 2014-07-05 ENCOUNTER — Ambulatory Visit (HOSPITAL_COMMUNITY): Payer: Medicare Other | Admitting: Physical Therapy

## 2014-07-10 ENCOUNTER — Ambulatory Visit (HOSPITAL_COMMUNITY): Payer: Medicare Other | Admitting: Physical Therapy

## 2014-07-12 ENCOUNTER — Ambulatory Visit (HOSPITAL_COMMUNITY): Payer: Medicare Other | Admitting: Physical Therapy

## 2014-08-01 ENCOUNTER — Ambulatory Visit: Payer: Medicare Other | Admitting: Orthopedic Surgery

## 2014-08-08 ENCOUNTER — Ambulatory Visit (INDEPENDENT_AMBULATORY_CARE_PROVIDER_SITE_OTHER): Payer: Self-pay | Admitting: Orthopedic Surgery

## 2014-08-08 ENCOUNTER — Encounter: Payer: Self-pay | Admitting: Orthopedic Surgery

## 2014-08-08 VITALS — BP 153/81 | Ht 71.5 in | Wt 276.0 lb

## 2014-08-08 DIAGNOSIS — Z9889 Other specified postprocedural states: Secondary | ICD-10-CM

## 2014-08-08 DIAGNOSIS — Z23 Encounter for immunization: Secondary | ICD-10-CM | POA: Diagnosis not present

## 2014-08-08 NOTE — Progress Notes (Signed)
Chief Complaint  Patient presents with  . Follow-up    1 month recheck Left knee SALK 05/31/14    Brian [name] is making excellent progress has full extension of his knee no extensor lag good strength normal range of motion. No swelling. His family without assistive devices he has some soreness is activity related and this is not unexpected  Recommend continued strengthening exercises advance activities as tolerated return in approximately 6 months for routine followup

## 2014-08-08 NOTE — Patient Instructions (Signed)
Schedule appointment, new problem, Right foot pain, needs xray

## 2014-08-14 NOTE — Addendum Note (Signed)
Encounter addended by: Leia Alf, PT on: 08/14/2014  5:42 PM<BR>     Documentation filed: Letters, Episodes, Notes Section

## 2014-08-14 NOTE — Progress Notes (Signed)
Discharge Patient Details  Name: Brian [name] MRN: [id number] Date of Birth: Jan 21, 1941  Today's Date: 08/14/2014  Patient discharged following MD advisement that he no longer needed therapy   Brian [name], Brian [name] 08/14/2014, 5:39 PM

## 2014-08-27 ENCOUNTER — Ambulatory Visit (INDEPENDENT_AMBULATORY_CARE_PROVIDER_SITE_OTHER): Payer: Medicare Other | Admitting: Orthopedic Surgery

## 2014-08-27 ENCOUNTER — Encounter: Payer: Self-pay | Admitting: Orthopedic Surgery

## 2014-08-27 ENCOUNTER — Ambulatory Visit (INDEPENDENT_AMBULATORY_CARE_PROVIDER_SITE_OTHER): Payer: Medicare Other

## 2014-08-27 VITALS — BP 147/80 | Ht 71.5 in | Wt 276.0 lb

## 2014-08-27 DIAGNOSIS — M79671 Pain in right foot: Secondary | ICD-10-CM | POA: Diagnosis not present

## 2014-08-27 DIAGNOSIS — M7661 Achilles tendinitis, right leg: Secondary | ICD-10-CM | POA: Diagnosis not present

## 2014-08-27 MED ORDER — MELOXICAM 15 MG PO TABS: 15.0000 mg | ORAL_TABLET | Freq: Every day | ORAL | Status: DC

## 2014-08-27 NOTE — Progress Notes (Signed)
Patient ID: Brian [name], male   DOB: [birth year], 73 y.o.   MRN: [id number] Subjective:   Chief Complaint  Patient presents with  . Foot Pain    right heel pain, no known injury     Brian [name] is a 73 y.o. male who presents with right ankle pain. Onset of the symptoms was 1 yesr ago. Inciting event: none known. Current symptoms include: ability to bear weight, but with some pain and stabbing 6/10 pain in the posterior area of the heel. Aggravating factors: none. Symptoms have gradually worsened. Patient has had no prior ankle problems. Evaluation to date: none. Treatment to date: none.     Past Medical History  Diagnosis Date  . Atypical chest pain     Low-risk exercise stress Cardiolite EF 60% April 2008. Reportedly normal remote cardiac catheterization @ [place] EF 60%...nuclear....2008  . Mild aortic regurgitation with LV dilation by prior echocardiogram   . Thrombocytopenia     mild follow Dr. Alonna Minium  . Obstructive sleep apnea     C-Pap compliant  . Hypertension   . Dyslipidemia   . Obesity   . Vertigo     Hospital 03/2010  . Bradycardia     hospital 03/2010 stopped beta blocker  . Complication of anesthesia   . PONV (postoperative nausea and vomiting)   . Diabetes mellitus without complication    ROS : left shin pain  . Objective:    BP 147/80 mmHg  Ht 5' 11.5" (1.816 m)  Wt 276 lb (125.193 kg)  BMI 37.96 kg/m2 Right ankle:   right ankle range of motion is normal. There is tenderness in the Achilles tendon and the insertion. The ankle is stable strength is normal times test is normal skin is intact with some discoloration from chronic venous stasis and circulatory issues but the temperature is warm but there is no ankle edema sensation is normal  Left ankle:   normal no effusion, full range of motion, no tenderness.   Imaging: Independent interpretation of the foot x-ray reveals normal foot without spur Assessment:    Achilles tendinitis    Plan:    we  recommend anti-inflammatory medication and Cam Walker for 6 weeks then follow-up

## 2014-08-27 NOTE — Patient Instructions (Signed)
Encounter Diagnoses  Name Primary?  . Heel pain, right   . Tendonitis, Achilles, right Yes    Meds ordered this encounter  Medications  . meloxicam (MOBIC) 15 MG tablet    Sig: Take 1 tablet (15 mg total) by mouth daily.    Dispense:  42 tablet    Refill:  0   Wear boot except driving, sleeping and bathing   Apply ice to the area every night for 20 min  Achilles Tendinitis Achilles tendinitis is inflammation of the tough, cord-like band that attaches the lower muscles of your leg to your heel (Achilles tendon). It is usually caused by overusing the tendon and joint involved.  CAUSES Achilles tendinitis can happen because of:  A sudden increase in exercise or activity (such as running).  Doing the same exercises or activities (such as jumping) over and over.  Not warming up calf muscles before exercising.  Exercising in shoes that are worn out or not made for exercise.  Having arthritis or a bone growth on the back of the heel bone. This can rub against the tendon and hurt the tendon. SIGNS AND SYMPTOMS The most common symptoms are:  Pain in the back of the leg, just above the heel. The pain usually gets worse with exercise and better with rest.  Stiffness or soreness in the back of the leg, especially in the morning.  Swelling of the skin over the Achilles tendon.  Trouble standing on tiptoe. Sometimes, an Achilles tendon tears (ruptures). Symptoms of an Achilles tendon rupture can include:  Sudden, severe pain in the back of the leg.  Trouble putting weight on the foot or walking normally. DIAGNOSIS Achilles tendinitis will be diagnosed based on symptoms and a physical examination. An X-ray may be done to check if another condition is causing your symptoms. An MRI may be ordered if your health care provider suspects you may have completely torn your tendon, which is called an Achilles tendon rupture.  TREATMENT  Achilles tendinitis usually gets better over time. It  can take weeks to months to heal completely. Treatment focuses on treating the symptoms and helping the injury heal. HOME CARE INSTRUCTIONS   Rest your Achilles tendon and avoid activities that cause pain.  Apply ice to the injured area:  Put ice in a plastic bag.  Place a towel between your skin and the bag.  Leave the ice on for 20 minutes, 2-3 times a day  Try to avoid using the tendon (other than gentle range of motion) while the tendon is painful. Do not resume use until instructed by your health care provider. Then begin use gradually. Do not increase use to the point of pain. If pain does develop, decrease use and continue the above measures. Gradually increase activities that do not cause discomfort until you achieve normal use.  Do exercises to make your calf muscles stronger and more flexible. Your health care provider or physical therapist can recommend exercises for you to do.  Wrap your ankle with an elastic bandage or other wrap. This can help keep your tendon from moving too much. Your health care provider will show you how to wrap your ankle correctly.  Only take over-the-counter or prescription medicines for pain, discomfort, or fever as directed by your health care provider. SEEK MEDICAL CARE IF:   Your pain and swelling increase or pain is uncontrolled with medicines.  You develop new, unexplained symptoms or your symptoms get worse.  You are unable to move your  toes or foot.  You develop warmth and swelling in your foot.  You have an unexplained temperature. MAKE SURE YOU:   Understand these instructions.  Will watch your condition.  Will get help right away if you are not doing well or get worse. Document Released: 07/21/2005 Document Revised: 08/01/2013 Document Reviewed: 05/23/2013 [facility] Patient Information 2015 [place], [place]. This information is not intended to replace advice given to you by your health care provider. Make sure you discuss any  questions you have with your health care provider.

## 2014-09-03 DIAGNOSIS — E119 Type 2 diabetes mellitus without complications: Secondary | ICD-10-CM | POA: Diagnosis not present

## 2014-09-03 DIAGNOSIS — I10 Essential (primary) hypertension: Secondary | ICD-10-CM | POA: Diagnosis not present

## 2014-09-10 DIAGNOSIS — D696 Thrombocytopenia, unspecified: Secondary | ICD-10-CM | POA: Diagnosis not present

## 2014-09-10 DIAGNOSIS — I10 Essential (primary) hypertension: Secondary | ICD-10-CM | POA: Diagnosis not present

## 2014-09-10 DIAGNOSIS — E1165 Type 2 diabetes mellitus with hyperglycemia: Secondary | ICD-10-CM | POA: Diagnosis not present

## 2014-10-08 ENCOUNTER — Ambulatory Visit (INDEPENDENT_AMBULATORY_CARE_PROVIDER_SITE_OTHER): Payer: Medicare Other | Admitting: Orthopedic Surgery

## 2014-10-08 ENCOUNTER — Encounter: Payer: Self-pay | Admitting: Orthopedic Surgery

## 2014-10-08 VITALS — BP 153/90 | Ht 71.5 in | Wt 276.0 lb

## 2014-10-08 DIAGNOSIS — M7661 Achilles tendinitis, right leg: Secondary | ICD-10-CM | POA: Diagnosis not present

## 2014-10-08 NOTE — Patient Instructions (Signed)
Ice and anti inflammatory as needed

## 2014-10-08 NOTE — Progress Notes (Signed)
Patient ID: Brian [name], male   DOB: [birth year], 73 y.o.   MRN: [id number] Chief Complaint  Patient presents with  . Follow-up    6 week recheck right achilles tendon   Patient treated with Cam Walker and anti-inflammatories for 6 weeks for right Achilles tendinitis.  Results treatment/response: Improved with decreased pain and improve function  Review of systems the patient has weakness and inability to bear weight after sitting for long periods of time left knee status post saw, to follow-up in February.  BP 153/90 mmHg  Ht 5' 11.5" (1.816 m)  Wt 276 lb (125.193 kg)  BMI 37.96 kg/m2 His ankle looks good he has very minimal palpable tenderness I feel some nodularity but he has normal range of motion stable ankle normal plantarflexion strength normal Thompson test skin is intact has no peripheral edema  Resolved Achilles tendinitis follow-up when necessary for that and follow-up left knee in February

## 2014-10-14 ENCOUNTER — Telehealth: Payer: Self-pay | Admitting: Orthopedic Surgery

## 2014-10-14 NOTE — Telephone Encounter (Signed)
noted 

## 2014-10-14 NOTE — Telephone Encounter (Signed)
Patient stopped in to office -- requests update of his medication list.  He has left a copy - in box for review and update.  His cell ph# [phone].

## 2014-10-16 ENCOUNTER — Other Ambulatory Visit: Payer: Self-pay | Admitting: *Deleted

## 2014-12-11 DIAGNOSIS — I10 Essential (primary) hypertension: Secondary | ICD-10-CM | POA: Diagnosis not present

## 2014-12-11 DIAGNOSIS — E119 Type 2 diabetes mellitus without complications: Secondary | ICD-10-CM | POA: Diagnosis not present

## 2014-12-17 DIAGNOSIS — E1165 Type 2 diabetes mellitus with hyperglycemia: Secondary | ICD-10-CM | POA: Diagnosis not present

## 2014-12-17 DIAGNOSIS — I10 Essential (primary) hypertension: Secondary | ICD-10-CM | POA: Diagnosis not present

## 2014-12-17 DIAGNOSIS — D696 Thrombocytopenia, unspecified: Secondary | ICD-10-CM | POA: Diagnosis not present

## 2014-12-17 DIAGNOSIS — Z6838 Body mass index (BMI) 38.0-38.9, adult: Secondary | ICD-10-CM | POA: Diagnosis not present

## 2015-01-10 ENCOUNTER — Encounter: Payer: Medicare Other | Admitting: Family Medicine

## 2015-02-11 ENCOUNTER — Ambulatory Visit: Payer: Medicare Other | Admitting: Orthopedic Surgery

## 2015-02-20 ENCOUNTER — Ambulatory Visit (INDEPENDENT_AMBULATORY_CARE_PROVIDER_SITE_OTHER): Payer: Medicare Other | Admitting: Orthopedic Surgery

## 2015-02-20 ENCOUNTER — Encounter: Payer: Self-pay | Admitting: Orthopedic Surgery

## 2015-02-20 VITALS — BP 180/89 | Ht 71.5 in | Wt 275.0 lb

## 2015-02-20 DIAGNOSIS — M7661 Achilles tendinitis, right leg: Secondary | ICD-10-CM

## 2015-02-20 DIAGNOSIS — M1712 Unilateral primary osteoarthritis, left knee: Secondary | ICD-10-CM

## 2015-02-20 DIAGNOSIS — Z9889 Other specified postprocedural states: Secondary | ICD-10-CM | POA: Diagnosis not present

## 2015-02-20 MED ORDER — DICLOFENAC POTASSIUM 50 MG PO TABS: 50.0000 mg | ORAL_TABLET | Freq: Two times a day (BID) | ORAL | Status: DC

## 2015-02-20 NOTE — Progress Notes (Signed)
Patient ID: Brian [name], male   DOB: 1940-11-11, 74 y.o.   MRN: [id number] Chief Complaint  Patient presents with  . Follow-up    6 month follow up [place], [place] 05/31/14    History the patient arthroscopy of his left knee had arthritis in the knee and torn medial meniscus. He also has Achilles tendinitis on the right  He was treated with a Cam Walker  He comes in complaining of Achilles tendon pain with ambulation walking and would like something else other than a boot which she wears intermittently and not consistently. He also has pain on the medial joint line radiating down his tibia without swelling. His current medications for his pain includes Aleve as needed  His medications are up-to-date include metformin vitamins carpal dial glipizide amlodipine and irbesartan  System review negative for numbness or tingling in the legs (neurologic) he does have some bilateral foot pain (musculoskeletal) which is probably diabetic related. He will see his medical doctor for any issues with that   BP 180/89 mmHg  Ht 5' 11.5" (1.816 m)  Wt 275 lb (124.739 kg)  BMI 37.82 kg/m2 Gen. appearance normal mesomorphic body habitus oriented 3 mood normal gait normal Today his knee looks good he has no swelling his range of motion is free and without pain he has good motor function in the quadriceps his knee is stable. His right Achilles shows an Achilles tendon pump bump with no swelling in his gait pattern remains normal Vascular exam intact left leg skin left knee normal no warmth or erythema  Encounter Diagnoses  Name Primary?  . Tendonitis, Achilles, right Yes  . S/P left knee arthroscopy   . Primary osteoarthritis of left knee    Achilles tendon right not improved still symptomatic left knee  Plan at this time is to give him 50 mg of diclofenac twice a day start him on a [name] [facility] preparation #1 follow-up in 6 months

## 2015-03-04 ENCOUNTER — Telehealth: Payer: Self-pay | Admitting: Orthopedic Surgery

## 2015-03-04 NOTE — Telephone Encounter (Signed)
Yes 15

## 2015-03-04 NOTE — Telephone Encounter (Signed)
Patient states that that the medication Diclofen SOD 50mg  is to expensive and he is asking if he can start taking the Meloxican 15 & 30 mg daily instead due to insurance purposes please advise? Patient states send Rx to [facility] for 90 days Or a 20 day supply to the pharmacy [facility] for [place].

## 2015-03-04 NOTE — Telephone Encounter (Signed)
CALLED PATIENT, NO ANSWER °

## 2015-03-05 NOTE — Telephone Encounter (Signed)
Called patient, no answer, left vm 

## 2015-03-06 ENCOUNTER — Other Ambulatory Visit: Payer: Self-pay | Admitting: *Deleted

## 2015-03-06 MED ORDER — MELOXICAM 15 MG PO TABS: 15.0000 mg | ORAL_TABLET | Freq: Every day | ORAL | Status: DC

## 2015-03-06 NOTE — Telephone Encounter (Signed)
PATIENT IS AWARE, MED SENT

## 2015-03-28 DIAGNOSIS — I10 Essential (primary) hypertension: Secondary | ICD-10-CM | POA: Diagnosis not present

## 2015-03-28 DIAGNOSIS — E1165 Type 2 diabetes mellitus with hyperglycemia: Secondary | ICD-10-CM | POA: Diagnosis not present

## 2015-04-01 DIAGNOSIS — I10 Essential (primary) hypertension: Secondary | ICD-10-CM | POA: Diagnosis not present

## 2015-04-01 DIAGNOSIS — E1165 Type 2 diabetes mellitus with hyperglycemia: Secondary | ICD-10-CM | POA: Diagnosis not present

## 2015-04-01 DIAGNOSIS — D696 Thrombocytopenia, unspecified: Secondary | ICD-10-CM | POA: Diagnosis not present

## 2015-04-01 DIAGNOSIS — R809 Proteinuria, unspecified: Secondary | ICD-10-CM | POA: Diagnosis not present

## 2015-05-13 DIAGNOSIS — I10 Essential (primary) hypertension: Secondary | ICD-10-CM | POA: Diagnosis not present

## 2015-05-15 ENCOUNTER — Telehealth: Payer: Self-pay | Admitting: Orthopedic Surgery

## 2015-05-15 NOTE — Telephone Encounter (Signed)
Patient is calling requesting refill on meloxicam (MOBIC) 15 MG tablet [[id number] patient states that it is working well for him, please advise?

## 2015-05-15 NOTE — Telephone Encounter (Signed)
Okay to refill? 

## 2015-05-15 NOTE — Telephone Encounter (Signed)
YES

## 2015-05-16 ENCOUNTER — Other Ambulatory Visit: Payer: Self-pay | Admitting: *Deleted

## 2015-05-16 MED ORDER — MELOXICAM 15 MG PO TABS: 15.0000 mg | ORAL_TABLET | Freq: Every day | ORAL | Status: DC

## 2015-05-16 NOTE — Telephone Encounter (Signed)
Sent to [facility] as requested

## 2015-06-02 DIAGNOSIS — I10 Essential (primary) hypertension: Secondary | ICD-10-CM | POA: Diagnosis not present

## 2015-06-06 DIAGNOSIS — Z9989 Dependence on other enabling machines and devices: Secondary | ICD-10-CM | POA: Diagnosis not present

## 2015-06-06 DIAGNOSIS — H9201 Otalgia, right ear: Secondary | ICD-10-CM | POA: Diagnosis not present

## 2015-06-06 DIAGNOSIS — H6121 Impacted cerumen, right ear: Secondary | ICD-10-CM | POA: Diagnosis not present

## 2015-06-06 DIAGNOSIS — G4733 Obstructive sleep apnea (adult) (pediatric): Secondary | ICD-10-CM | POA: Diagnosis not present

## 2015-07-31 DIAGNOSIS — Z125 Encounter for screening for malignant neoplasm of prostate: Secondary | ICD-10-CM | POA: Diagnosis not present

## 2015-07-31 DIAGNOSIS — E1165 Type 2 diabetes mellitus with hyperglycemia: Secondary | ICD-10-CM | POA: Diagnosis not present

## 2015-08-05 DIAGNOSIS — I10 Essential (primary) hypertension: Secondary | ICD-10-CM | POA: Diagnosis not present

## 2015-08-05 DIAGNOSIS — E1165 Type 2 diabetes mellitus with hyperglycemia: Secondary | ICD-10-CM | POA: Diagnosis not present

## 2015-08-05 DIAGNOSIS — Z6838 Body mass index (BMI) 38.0-38.9, adult: Secondary | ICD-10-CM | POA: Diagnosis not present

## 2015-08-05 DIAGNOSIS — D6949 Other primary thrombocytopenia: Secondary | ICD-10-CM | POA: Diagnosis not present

## 2015-08-18 ENCOUNTER — Telehealth: Payer: Self-pay | Admitting: Orthopedic Surgery

## 2015-08-18 ENCOUNTER — Other Ambulatory Visit: Payer: Self-pay | Admitting: *Deleted

## 2015-08-18 MED ORDER — DICLOFENAC POTASSIUM 50 MG PO TABS: 50.0000 mg | ORAL_TABLET | Freq: Two times a day (BID) | ORAL | Status: DC

## 2015-08-18 NOTE — Telephone Encounter (Signed)
Sent as requested.

## 2015-08-18 NOTE — Telephone Encounter (Signed)
Patient is requesting a refill on medication on meloxicam (MOBIC) 15 MG tablet #90 to [facility] please advise?

## 2015-08-18 NOTE — Telephone Encounter (Signed)
Routing to Dr Harrison for approval 

## 2015-08-18 NOTE — Telephone Encounter (Signed)
approve

## 2015-08-25 ENCOUNTER — Ambulatory Visit (INDEPENDENT_AMBULATORY_CARE_PROVIDER_SITE_OTHER): Payer: Medicare Other | Admitting: Orthopedic Surgery

## 2015-08-25 VITALS — BP 163/94 | Ht 71.5 in | Wt 276.0 lb

## 2015-08-25 DIAGNOSIS — Z9889 Other specified postprocedural states: Secondary | ICD-10-CM | POA: Diagnosis not present

## 2015-08-25 DIAGNOSIS — M1712 Unilateral primary osteoarthritis, left knee: Secondary | ICD-10-CM | POA: Diagnosis not present

## 2015-08-25 DIAGNOSIS — M7661 Achilles tendinitis, right leg: Secondary | ICD-10-CM

## 2015-08-25 MED ORDER — MELOXICAM 15 MG PO TABS: 15.0000 mg | ORAL_TABLET | Freq: Every day | ORAL | Status: DC

## 2015-08-27 NOTE — Progress Notes (Signed)
Status postknee arthroscopy patient has osteoarthritis of his left knee  Comes in for follow-up visit  Patient is well with anti-inflammatory medication  Review of systems no catching locking or giving way symptoms of any significance. Seems to have some difficulty if he does too much work on the knee but basically when he doesn't do anything the knee gets stiff  Review of systems negative for swelling  Exam vital signs are stable tenderness medially is mild range of motion reveals slight flexion contracture motor exam intact neurovascular exam normal knee stable  Osteoarthritis of the knee  Continue current medications as follows  Encounter Diagnoses  Name Primary?  . S/P left knee arthroscopy Yes  . Primary osteoarthritis of left knee   . Tendonitis, Achilles, right    Tendinitis Achilles right is completely resolved  Established problem improved   Meds ordered this encounter  Medications  . meloxicam (MOBIC) 15 MG tablet    Sig: Take 1 tablet (15 mg total) by mouth daily.    Dispense:  90 tablet    Refill:  0  . Glucosamine-Chondroit-Vit C-Mn (GLUCOSAMINE 1500 COMPLEX PO)    Sig: Take by mouth.  . Misc Natural Products (GLUCOSAMINE CHONDROITIN MSM PO)    Sig: Take by mouth.

## 2015-11-26 ENCOUNTER — Telehealth: Payer: Self-pay | Admitting: *Deleted

## 2015-11-26 ENCOUNTER — Other Ambulatory Visit: Payer: Self-pay | Admitting: *Deleted

## 2015-11-26 MED ORDER — MELOXICAM 15 MG PO TABS: 15.0000 mg | ORAL_TABLET | Freq: Every day | ORAL | Status: DC

## 2015-11-26 NOTE — Telephone Encounter (Signed)
Med refilled as requested

## 2015-11-26 NOTE — Telephone Encounter (Signed)
Okay to refill? 

## 2015-11-26 NOTE — Telephone Encounter (Signed)
Patient came into the office today requesting meloxicam 15mg  refilled to [facility]. Please advise [phone]

## 2015-11-26 NOTE — Telephone Encounter (Signed)
I called patient and made him aware that his rx was sent.

## 2015-11-26 NOTE — Telephone Encounter (Signed)
yes

## 2015-12-09 DIAGNOSIS — L0291 Cutaneous abscess, unspecified: Secondary | ICD-10-CM | POA: Diagnosis not present

## 2015-12-11 DIAGNOSIS — E1165 Type 2 diabetes mellitus with hyperglycemia: Secondary | ICD-10-CM | POA: Diagnosis not present

## 2015-12-11 DIAGNOSIS — I10 Essential (primary) hypertension: Secondary | ICD-10-CM | POA: Diagnosis not present

## 2015-12-16 DIAGNOSIS — E782 Mixed hyperlipidemia: Secondary | ICD-10-CM | POA: Diagnosis not present

## 2015-12-16 DIAGNOSIS — I10 Essential (primary) hypertension: Secondary | ICD-10-CM | POA: Diagnosis not present

## 2015-12-16 DIAGNOSIS — E1165 Type 2 diabetes mellitus with hyperglycemia: Secondary | ICD-10-CM | POA: Diagnosis not present

## 2015-12-16 DIAGNOSIS — D696 Thrombocytopenia, unspecified: Secondary | ICD-10-CM | POA: Diagnosis not present

## 2015-12-16 DIAGNOSIS — Z23 Encounter for immunization: Secondary | ICD-10-CM | POA: Diagnosis not present

## 2015-12-25 ENCOUNTER — Emergency Department (HOSPITAL_COMMUNITY)
Admission: EM | Admit: 2015-12-25 | Discharge: 2015-12-25 | Disposition: A | Discharge status: Home / Self Care | Payer: Medicare Other | Attending: Emergency Medicine | Admitting: Emergency Medicine

## 2015-12-25 ENCOUNTER — Encounter (HOSPITAL_COMMUNITY): Payer: Self-pay | Admitting: Emergency Medicine

## 2015-12-25 ENCOUNTER — Emergency Department (HOSPITAL_COMMUNITY): Payer: Medicare Other

## 2015-12-25 DIAGNOSIS — S61209A Unspecified open wound of unspecified finger without damage to nail, initial encounter: Secondary | ICD-10-CM

## 2015-12-25 DIAGNOSIS — Y999 Unspecified external cause status: Secondary | ICD-10-CM | POA: Diagnosis not present

## 2015-12-25 DIAGNOSIS — S63287A Dislocation of proximal interphalangeal joint of left little finger, initial encounter: Secondary | ICD-10-CM | POA: Diagnosis not present

## 2015-12-25 DIAGNOSIS — I10 Essential (primary) hypertension: Secondary | ICD-10-CM | POA: Diagnosis not present

## 2015-12-25 DIAGNOSIS — E785 Hyperlipidemia, unspecified: Secondary | ICD-10-CM | POA: Insufficient documentation

## 2015-12-25 DIAGNOSIS — Z23 Encounter for immunization: Secondary | ICD-10-CM | POA: Diagnosis not present

## 2015-12-25 DIAGNOSIS — Y939 Activity, unspecified: Secondary | ICD-10-CM | POA: Diagnosis not present

## 2015-12-25 DIAGNOSIS — Y92481 Parking lot as the place of occurrence of the external cause: Secondary | ICD-10-CM | POA: Diagnosis not present

## 2015-12-25 DIAGNOSIS — Z79899 Other long term (current) drug therapy: Secondary | ICD-10-CM | POA: Diagnosis not present

## 2015-12-25 DIAGNOSIS — W1830XA Fall on same level, unspecified, initial encounter: Secondary | ICD-10-CM | POA: Insufficient documentation

## 2015-12-25 DIAGNOSIS — S63279A Dislocation of unspecified interphalangeal joint of unspecified finger, initial encounter: Secondary | ICD-10-CM

## 2015-12-25 DIAGNOSIS — S63277A Dislocation of unspecified interphalangeal joint of left little finger, initial encounter: Secondary | ICD-10-CM | POA: Diagnosis not present

## 2015-12-25 DIAGNOSIS — Z7984 Long term (current) use of oral hypoglycemic drugs: Secondary | ICD-10-CM | POA: Diagnosis not present

## 2015-12-25 DIAGNOSIS — S63237A Subluxation of proximal interphalangeal joint of left little finger, initial encounter: Secondary | ICD-10-CM | POA: Diagnosis not present

## 2015-12-25 DIAGNOSIS — S6990XA Unspecified injury of unspecified wrist, hand and finger(s), initial encounter: Secondary | ICD-10-CM | POA: Diagnosis present

## 2015-12-25 DIAGNOSIS — E119 Type 2 diabetes mellitus without complications: Secondary | ICD-10-CM | POA: Insufficient documentation

## 2015-12-25 DIAGNOSIS — E669 Obesity, unspecified: Secondary | ICD-10-CM | POA: Insufficient documentation

## 2015-12-25 DIAGNOSIS — Z880 Allergy status to penicillin: Secondary | ICD-10-CM | POA: Insufficient documentation

## 2015-12-25 MED ORDER — HYDROCODONE-ACETAMINOPHEN 5-325 MG PO TABS
1.0000 | ORAL_TABLET | Freq: Once | ORAL | Status: AC
  Administered 2015-12-25: 1 via ORAL
  Filled 2015-12-25: qty 1

## 2015-12-25 MED ORDER — HYDROCODONE-ACETAMINOPHEN 5-325 MG PO TABS: 1.0000 | ORAL_TABLET | Freq: Four times a day (QID) | ORAL | Status: DC | PRN

## 2015-12-25 MED ORDER — TETANUS-DIPHTH-ACELL PERTUSSIS 5-2.5-18.5 LF-MCG/0.5 IM SUSP
0.5000 mL | Freq: Once | INTRAMUSCULAR | Status: AC
  Administered 2015-12-25: 0.5 mL via INTRAMUSCULAR
  Filled 2015-12-25: qty 0.5

## 2015-12-25 MED ORDER — CEPHALEXIN 500 MG PO CAPS
500.0000 mg | ORAL_CAPSULE | Freq: Once | ORAL | Status: AC
  Administered 2015-12-25: 500 mg via ORAL
  Filled 2015-12-25: qty 1

## 2015-12-25 MED ORDER — CEPHALEXIN 500 MG PO CAPS: 500.0000 mg | ORAL_CAPSULE | Freq: Three times a day (TID) | ORAL | Status: DC

## 2015-12-25 NOTE — Discharge Instructions (Signed)
As discussed, with your dislocated finger is important that you follow-up with our hand specialist within 1 week.  Please take all medication as directed, and return here for concerning changes in your condition.

## 2015-12-25 NOTE — ED Provider Notes (Signed)
CSN: [id number]     Arrival date & time 12/25/15  1251 History   First MD Initiated Contact with Patient 12/25/15 1303     Chief Complaint  Patient presents with  . Finger Injury    HPI  Patient presents medially after a fall, during which he sustained an injury to his left hand. Patient states that he was well prior to the fall. Patient tripped, landing on his left hand. He denies other injuries. Since the event there has been a deformity, with associated pain in the left hand. Pain is sore, severe, but present with motion only. No distal loss of sensation or weakness. No other injuries, no other complaints. No medication taken since the event.   Past Medical History  Diagnosis Date  . Atypical chest pain     Low-risk exercise stress Cardiolite EF 60% April 2008. Reportedly normal remote cardiac catheterization @ [place] EF 60%...nuclear....2008  . Mild aortic regurgitation with LV dilation by prior echocardiogram   . Thrombocytopenia (HCC)     mild follow Dr. Alonna Minium  . Obstructive sleep apnea     C-Pap compliant  . Hypertension   . Dyslipidemia   . Obesity   . Vertigo     Hospital 03/2010  . Bradycardia     hospital 03/2010 stopped beta blocker  . Complication of anesthesia   . PONV (postoperative nausea and vomiting)   . Diabetes mellitus without complication [facility])    Past Surgical History  Procedure Laterality Date  . Cardiac catheterization    . Status post bilateral arthroscopic knee surgery    . Knee arthroscopy with medial menisectomy Left 05/31/2014    Procedure: LEFT KNEE ARTHROSCOPY WITH MEDIAL MENISECTOMY;  Surgeon: Carole Civil, MD;  Location: [facility];  Service: Orthopedics;  Laterality: Left;   Family History  Problem Relation Age of Onset  . Heart attack Mother     fatal, cause of death   Social History  Substance Use Topics  . Smoking status: Never Smoker   . Smokeless tobacco: None  . Alcohol Use: Yes     Comment: wine on occasion    Review of  Systems  Constitutional: Negative for fever.  Respiratory: Negative for shortness of breath.   Cardiovascular: Negative for chest pain.  Musculoskeletal:       Negative aside from HPI  Skin: Positive for color change and wound.       Negative aside from HPI  Allergic/Immunologic: Negative for immunocompromised state.  Neurological: Negative for weakness and numbness.      Allergies  Penicillins  Home Medications   Prior to Admission medications   Medication Sig Start Date End Date Taking? Authorizing Provider  amLODipine (NORVASC) 10 MG tablet Take 10 mg by mouth daily.    Historical Provider, MD  glipiZIDE (GLUCOTROL XL) 5 MG 24 hr tablet Take 5 mg by mouth daily with breakfast.    Historical Provider, MD  Glucosamine-Chondroit-Vit C-Mn (GLUCOSAMINE 1500 COMPLEX PO) Take by mouth.    Historical Provider, MD  irbesartan (AVAPRO) 300 MG tablet Take 300 mg by mouth daily.    Historical Provider, MD  meloxicam (MOBIC) 15 MG tablet Take 1 tablet (15 mg total) by mouth daily. 11/26/15   Carole Civil, MD  metFORMIN (GLUCOPHAGE) 500 MG tablet Take 500 mg by mouth daily.     Historical Provider, MD  Misc Natural Products (GLUCOSAMINE CHONDROITIN MSM PO) Take by mouth.    Historical Provider, MD  Multiple Vitamin (MULTIVITAMIN) tablet Take 1  tablet by mouth daily.    Historical Provider, MD   BP 184/88 mmHg  Pulse 72  Temp(Src) 98.4 F (36.9 C) (Oral)  Resp 18  Ht 5' 11.5" (1.816 m)  Wt 276 lb (125.193 kg)  BMI 37.96 kg/m2  SpO2 97% Physical Exam  Constitutional: He is oriented to person, place, and time. He appears well-developed. No distress.  HENT:  Head: Normocephalic and atraumatic.  Eyes: Conjunctivae and EOM are normal.  Cardiovascular: Normal rate, regular rhythm and intact distal pulses.   Pulmonary/Chest: Effort normal. No stridor. No respiratory distress.  Abdominal: He exhibits no distension.  Musculoskeletal: He exhibits no edema.  Obvious deformity of the left  fifth digit, with medial deviation of the distal two thirds, and medial open wound at the proximal interphalangeal joint, no active bleeding  Neurological: He is alert and oriented to person, place, and time.  Patient has distal sensation, and can move the proximal fifth digit  Skin: Skin is warm and dry.  Psychiatric: He has a normal mood and affect.  Nursing note and vitals reviewed.   ED Course  ORTHOPEDIC INJURY TREATMENT Date/Time: 12/25/2015 1:30 PM Performed by: Carmin Muskrat Authorized by: Carmin Muskrat Consent: The procedure was performed in an emergent situation. Verbal consent obtained. Risks and benefits: risks, benefits and alternatives were discussed Consent given by: patient Patient understanding: patient states understanding of the procedure being performed Patient consent: the patient's understanding of the procedure matches consent given Procedure consent: procedure consent matches procedure scheduled Relevant documents: relevant documents present and verified Test results: test results available and properly labeled Site marked: the operative site was marked Imaging studies: imaging studies available Required items: required blood products, implants, devices, and special equipment available Patient identity confirmed: verbally with patient Time out: Immediately prior to procedure a "time out" was called to verify the correct patient, procedure, equipment, support staff and site/side marked as required. Injury location: finger Location details: left little finger Injury type: dislocation (with open wound, visible bone on medial surface) Dislocation type: PIP Pre-procedure neurovascular assessment: neurovascularly intact Pre-procedure distal perfusion: normal Pre-procedure neurological function: normal Pre-procedure range of motion: reduced Local anesthesia used: no Patient sedated: no (oral narcotic) Manipulation performed: yes Reduction successful:  yes X-ray confirmed reduction: obvious reduction. Immobilization: splint Supplies used: aluminum splint Post-procedure neurovascular assessment: post-procedure neurovascularly intact Post-procedure distal perfusion: normal Post-procedure neurological function: normal Post-procedure range of motion: improved Patient tolerance: Patient tolerated the procedure well with no immediate complications Comments: With the open wound, the patient had copious cleaning, irrigation, dressing, prior to splint placement   Imaging Review Dg Hand Complete Left  12/25/2015  CLINICAL DATA:  Tripped and fell 1 hour ago, pinky deformity EXAM: LEFT HAND - COMPLETE 3+ VIEW COMPARISON:  None. FINDINGS: Four views of the left hand submitted. There is lateral subluxation of middle phalanx from proximal interphalangeal joint left fifth finger. No definite acute fracture is identified. IMPRESSION: Lateral subluxation of proximal interphalangeal joint left fifth finger. Electronically Signed   By: Lahoma Crocker M.D.   On: 12/25/2015 13:33   I have personally reviewed and evaluated these images and lab results as part of my medical decision-making. Manipulation, reduction, successfully performed after review of x-ray  He tolerated reduction successfully with no complications, substantial improved condition  MDM  Patient presents after sustaining a obviously dislocated left fifth digit. Injury Located by open wound, possible exposure of proximal bone. Patient had irrigation, cleaning, initiation of antibiotics in addition to update of tetanus. Patient's dislocation  successfully reduced, and the patient will follow-up with our hand surgeons within one week.   Carmin Muskrat, MD 12/25/15 1410

## 2015-12-25 NOTE — ED Notes (Signed)
Obvioius deformity to left pinky finger after falling onto it in a store parking lot. Denies hitting head/loc.

## 2015-12-25 NOTE — ED Notes (Signed)
MD at bedside. 

## 2016-01-02 ENCOUNTER — Ambulatory Visit (HOSPITAL_COMMUNITY)
Admission: RE | Admit: 2016-01-02 | Discharge: 2016-01-02 | Disposition: A | Discharge status: Home / Self Care | Payer: Medicare Other | Source: Ambulatory Visit | Attending: Orthopedic Surgery | Admitting: Orthopedic Surgery

## 2016-01-02 ENCOUNTER — Other Ambulatory Visit: Payer: Self-pay | Admitting: Orthopedic Surgery

## 2016-01-02 ENCOUNTER — Ambulatory Visit: Payer: Medicare Other

## 2016-01-02 ENCOUNTER — Ambulatory Visit (INDEPENDENT_AMBULATORY_CARE_PROVIDER_SITE_OTHER): Payer: Medicare Other | Admitting: Orthopedic Surgery

## 2016-01-02 VITALS — BP 160/88 | Ht 71.5 in | Wt 276.0 lb

## 2016-01-02 DIAGNOSIS — T148XXA Other injury of unspecified body region, initial encounter: Secondary | ICD-10-CM

## 2016-01-02 DIAGNOSIS — S62607A Fracture of unspecified phalanx of left little finger, initial encounter for closed fracture: Secondary | ICD-10-CM | POA: Insufficient documentation

## 2016-01-02 DIAGNOSIS — S63289A Dislocation of proximal interphalangeal joint of unspecified finger, initial encounter: Secondary | ICD-10-CM | POA: Diagnosis not present

## 2016-01-02 DIAGNOSIS — Z09 Encounter for follow-up examination after completed treatment for conditions other than malignant neoplasm: Secondary | ICD-10-CM | POA: Diagnosis present

## 2016-01-02 DIAGNOSIS — S62615A Displaced fracture of proximal phalanx of left ring finger, initial encounter for closed fracture: Secondary | ICD-10-CM | POA: Diagnosis not present

## 2016-01-02 NOTE — Progress Notes (Signed)
Patient ID: Brian [name], male   DOB: [birth year], 75 y.o.   MRN: [id number]  Chief Complaint  Patient presents with  . Follow-up    ER follow up on left hand fracture, DOI 12-25-15.    HPI Brian [name] is a 75 y.o. male.  Patient presents for evaluation of a dislocation of his PIP joint of his left small finger which was an open dislocation this was treated emergency room with closed reduction and no follow-up x-ray He does complain of pain at the PIP joint there was ecchymosis and some drainage  He says he was put on an antibiotic afterwards  He needs new x-rays they were done at the hospital  That x-ray shows a congruent reduction He complains of pain no redness or erythema or swelling no fever  Review of Systems Review of Systems  Past Medical History  Diagnosis Date  . Atypical chest pain     Low-risk exercise stress Cardiolite EF 60% April 2008. Reportedly normal remote cardiac catheterization @ [place] EF 60%...nuclear....2008  . Mild aortic regurgitation with LV dilation by prior echocardiogram   . Thrombocytopenia (HCC)     mild follow Dr. Alonna Minium  . Obstructive sleep apnea     C-Pap compliant  . Hypertension   . Dyslipidemia   . Obesity   . Vertigo     Hospital 03/2010  . Bradycardia     hospital 03/2010 stopped beta blocker  . Complication of anesthesia   . PONV (postoperative nausea and vomiting)   . Diabetes mellitus without complication [facility])     Past Surgical History  Procedure Laterality Date  . Cardiac catheterization    . Status post bilateral arthroscopic knee surgery    . Knee arthroscopy with medial menisectomy Left 05/31/2014    Procedure: LEFT KNEE ARTHROSCOPY WITH MEDIAL MENISECTOMY;  Surgeon: Carole Civil, MD;  Location: [facility];  Service: Orthopedics;  Laterality: Left;    Family History  Problem Relation Age of Onset  . Heart attack Mother     fatal, cause of death    Social History Social History  Substance Use Topics  . Smoking  status: Never Smoker   . Smokeless tobacco: Not on file  . Alcohol Use: Yes     Comment: wine on occasion    Allergies  Allergen Reactions  . Asa [Aspirin] Other (See Comments)    Thins patient's blood too much.  . Penicillins Other (See Comments)    DIZZINESS Has patient had a PCN reaction causing immediate rash, facial/tongue/throat swelling, SOB or lightheadedness with hypotension: No Has patient had a PCN reaction causing severe rash involving mucus membranes or skin necrosis: No Has patient had a PCN reaction that required hospitalization No Has patient had a PCN reaction occurring within the last 10 years: No If all of the above answers are "NO", then may proceed with Cephalosporin use.     Current Outpatient Prescriptions  Medication Sig Dispense Refill  . amLODipine (NORVASC) 10 MG tablet Take 10 mg by mouth daily.    . cephALEXin (KEFLEX) 500 MG capsule Take 1 capsule (500 mg total) by mouth 3 (three) times daily. 21 capsule 0  . doxycycline (VIBRA-TABS) 100 MG tablet Take 1 tablet by mouth 2 (two) times daily. Reported on 12/25/2015    . glipiZIDE (GLUCOTROL XL) 5 MG 24 hr tablet Take 5 mg by mouth daily with breakfast.    . Glucosamine-Chondroit-Vit C-Mn (GLUCOSAMINE 1500 COMPLEX PO) Take by mouth.    Marland Kitchen  HYDROcodone-acetaminophen (NORCO/VICODIN) 5-325 MG tablet Take 1 tablet by mouth every 6 (six) hours as needed for moderate pain. 15 tablet 0  . irbesartan (AVAPRO) 300 MG tablet Take 300 mg by mouth daily.    . meloxicam (MOBIC) 15 MG tablet Take 1 tablet (15 mg total) by mouth daily. 90 tablet 0  . metFORMIN (GLUCOPHAGE) 500 MG tablet Take 500 mg by mouth daily.     . Misc Natural Products (GLUCOSAMINE CHONDROITIN MSM PO) Take by mouth.    . Multiple Vitamin (MULTIVITAMIN) tablet Take 1 tablet by mouth daily.     No current facility-administered medications for this visit.       Physical Exam Physical Exam Blood pressure 160/88, height 5' 11.5" (1.816 m), weight  276 lb (125.193 kg). Appearance, there are no abnormalities in terms of appearance the patient was well-developed and well-nourished. The grooming and hygiene were normal.  Mental status orientation, there was normal alertness and orientation Mood pleasant Ambulatory status normal with no assistive devices  Examination of the left small finger compared to right shows no malalignment problems Inspection tenderness at the PIP joint small laceration less than a centimeter Range of motion passive range of motion 60 flexion Tests for stability mild laxity on valgus stress test Motor strength  flexor tendon strength normal Skin warm dry and intact without laceration or ulceration or erythema Neurologic examination normal sensation Vascular examination normal pulses with warm extremity and normal capillary refill    initial film shows a dislocated PIP joint ulnar deviation   Data Reviewed  second film shows reduced congruent joint  Assessment  PIP dislocation open   Plan  recommend buddy take active range of motion Neosporin call if any redness or drainage follow-up 3 weeks

## 2016-01-19 ENCOUNTER — Ambulatory Visit (INDEPENDENT_AMBULATORY_CARE_PROVIDER_SITE_OTHER): Payer: Medicare Other | Admitting: Orthopedic Surgery

## 2016-01-19 ENCOUNTER — Encounter: Payer: Self-pay | Admitting: Orthopedic Surgery

## 2016-01-19 VITALS — BP 163/84 | Ht 71.5 in | Wt 275.0 lb

## 2016-01-19 DIAGNOSIS — S63289D Dislocation of proximal interphalangeal joint of unspecified finger, subsequent encounter: Secondary | ICD-10-CM | POA: Diagnosis not present

## 2016-01-19 NOTE — Progress Notes (Signed)
Patient ID: JARAMIAH JUD, male   DOB: June 01, 1941, 75 y.o.   MRN: [id number]  Chief Complaint  Patient presents with  . Follow-up    follow up left small finger, DOI 12/25/15    HPI open dislocation of the left small finger PIP joint reduced antibiotics were given that he taping was initiated for range of motion  ROS He did see some drainage which has subsequently resolved   BP 163/84 mmHg  Ht 5' 11.5" (1.816 m)  Wt 275 lb (124.739 kg)  BMI 37.82 kg/m2  Physical Exam  Constitutional: He is oriented to person, place, and time. He appears well-developed and well-nourished. No distress.  Cardiovascular: Normal rate and intact distal pulses.   Musculoskeletal:  Left small finger swollen around the PIP joint it is regained full range of motion the open area has closed is no drainage or sign of infection  The digit is stable alignment is normal and tenderness is mild  Neurological: He is alert and oriented to person, place, and time.  Skin: Skin is warm and dry. No rash noted. He is not diaphoretic. No erythema. No pallor.  Psychiatric: He has a normal mood and affect. His behavior is normal. Judgment and thought content normal.    Ortho Exam    ASSESSMENT AND PLAN   Encounter Diagnosis  Name Primary?  . Dislocation of finger PIP joint, subsequent encounter Yes     Call us if there is any drainage or redness of the finger. Continue range of motion exercises as tolerated. No need to tape anymore follow-up for the knee no follow-up necessary for the finger

## 2016-01-22 DIAGNOSIS — J029 Acute pharyngitis, unspecified: Secondary | ICD-10-CM | POA: Diagnosis not present

## 2016-01-27 DIAGNOSIS — J028 Acute pharyngitis due to other specified organisms: Secondary | ICD-10-CM | POA: Diagnosis not present

## 2016-01-28 ENCOUNTER — Telehealth: Payer: Self-pay | Admitting: Orthopedic Surgery

## 2016-01-28 NOTE — Telephone Encounter (Signed)
yes

## 2016-01-28 NOTE — Telephone Encounter (Signed)
Patient called stating that he has been having something sticking out of his little finger at the joint area. It breaks off, but it doesn't bleed, its just sore and red. He was wondering if he needs to come back in earlier than he is scheduled.

## 2016-01-28 NOTE — Telephone Encounter (Signed)
Routing to Dr Harrison 

## 2016-01-29 NOTE — Telephone Encounter (Signed)
Called patient and scheduled appointment; aware of appointment 01/30/16, 8:50a.m.

## 2016-01-29 NOTE — Telephone Encounter (Signed)
Routing to carol for scheduling 

## 2016-01-30 ENCOUNTER — Ambulatory Visit (INDEPENDENT_AMBULATORY_CARE_PROVIDER_SITE_OTHER): Payer: Medicare Other | Admitting: Orthopedic Surgery

## 2016-01-30 VITALS — BP 153/91 | Ht 71.5 in | Wt 275.0 lb

## 2016-01-30 DIAGNOSIS — S63289D Dislocation of proximal interphalangeal joint of unspecified finger, subsequent encounter: Secondary | ICD-10-CM | POA: Diagnosis not present

## 2016-01-30 NOTE — Progress Notes (Signed)
Chief Complaint  Patient presents with  . Follow-up    recheck left small finger, DOI 12-25-15.    Left small finger follow-up the patient had a open injury to the joint it was popped back in place he been on antibiotics he did well he has good flexion and his finger slight extension loss but he says he feels like something pricking his other finger so we had him come in we can't reproduce it today in the office he does have the ability make a fist she has a 5 flexion contracture he has some erythema there was minimal tenderness some pain with ulnar stress deviation but no sign of infection  Is on an oral antibiotic for cold which she will finish  I see him on May 1 for his knee and will look at his finger again we discussed surgical indications would be infection severe pain or functional deficit

## 2016-02-23 ENCOUNTER — Ambulatory Visit (INDEPENDENT_AMBULATORY_CARE_PROVIDER_SITE_OTHER): Payer: Medicare Other

## 2016-02-23 ENCOUNTER — Ambulatory Visit (INDEPENDENT_AMBULATORY_CARE_PROVIDER_SITE_OTHER): Payer: Medicare Other | Admitting: Orthopedic Surgery

## 2016-02-23 VITALS — BP 179/99 | HR 73 | Ht 71.0 in | Wt 273.0 lb

## 2016-02-23 DIAGNOSIS — M25562 Pain in left knee: Secondary | ICD-10-CM

## 2016-02-23 DIAGNOSIS — Z9889 Other specified postprocedural states: Secondary | ICD-10-CM | POA: Diagnosis not present

## 2016-02-23 DIAGNOSIS — M1712 Unilateral primary osteoarthritis, left knee: Secondary | ICD-10-CM

## 2016-02-23 NOTE — Patient Instructions (Signed)
Call when you are ready to schedule preop office visit for left total knee

## 2016-02-23 NOTE — Progress Notes (Signed)
Patient ID: Brian [name], male   DOB: 1941/07/01, 75 y.o.   MRN: [id number]  Chief Complaint  Patient presents with  . Follow-up    6 month F/U left knee    HPI-75 year old male had knee arthroscopy a year or so ago did well but now having increased pain on meloxicam 15 mg a day and glucosamine. He has trouble walking his knee gives way and is hard for him to climb the stairs  Review of Systems  Constitutional: Negative for fever.  Musculoskeletal: Negative for joint pain.  Neurological: Negative for tingling.  -  BP 179/99 mmHg  Pulse 73  Ht 5\' 11"  (1.803 m)  Wt 273 lb (123.832 kg)  BMI 38.09 kg/m2  Physical Exam  Constitutional: He is oriented to person, place, and time. He appears well-developed and well-nourished. No distress.  Cardiovascular: Normal rate and intact distal pulses.   Neurological: He is alert and oriented to person, place, and time.  Skin: Skin is warm and dry. No rash noted. He is not diaphoretic. No erythema. No pallor.  Psychiatric: He has a normal mood and affect. His behavior is normal. Judgment and thought content normal.    Ortho Exam  His gait is unsupported no significant limp  ASSESSMENT AND PLAN   X-ray shows varus alignment to the knee and severe medial compartment arthritis   The patient will decide when he wants to have knee replacement surgery we'll have him come back in for a preop history and physical exam

## 2016-02-25 ENCOUNTER — Other Ambulatory Visit: Payer: Self-pay | Admitting: *Deleted

## 2016-02-25 ENCOUNTER — Telehealth: Payer: Self-pay | Admitting: Orthopedic Surgery

## 2016-02-25 MED ORDER — MELOXICAM 15 MG PO TABS: 15.0000 mg | ORAL_TABLET | Freq: Every day | ORAL | Status: DC

## 2016-02-25 NOTE — Telephone Encounter (Signed)
MEDICATION REFILLED AS REQUESTED

## 2016-02-25 NOTE — Telephone Encounter (Signed)
Patient requests refill on medication which he states is through his [place]: meloxicam (MOBIC) 15 MG tablet [id number]  - states he did not remember to request refill at his recent office visit.  Please advise.

## 2016-03-03 ENCOUNTER — Telehealth: Payer: Self-pay | Admitting: Orthopedic Surgery

## 2016-03-03 NOTE — Telephone Encounter (Signed)
Schedule office visit, Dr wanted him to come in prior

## 2016-03-03 NOTE — Telephone Encounter (Signed)
Patient stopped in office to relay that he is ready to schedule surgery, left total knee replacement.  States would like to have it the week of 04/12/16, if possible, and said will be away 6/13-6/18/17.  His last note of 02/23/16 indicates:  "Call when you are ready to schedule preop office visit for left total knee"

## 2016-03-04 NOTE — Telephone Encounter (Signed)
Called patient; appointment scheduled accordingly.

## 2016-03-16 ENCOUNTER — Ambulatory Visit (INDEPENDENT_AMBULATORY_CARE_PROVIDER_SITE_OTHER): Payer: Medicare Other | Admitting: Orthopedic Surgery

## 2016-03-16 VITALS — BP 166/98 | HR 74 | Ht 71.5 in | Wt 273.0 lb

## 2016-03-16 DIAGNOSIS — Z9889 Other specified postprocedural states: Secondary | ICD-10-CM | POA: Diagnosis not present

## 2016-03-16 DIAGNOSIS — M1712 Unilateral primary osteoarthritis, left knee: Secondary | ICD-10-CM

## 2016-03-16 NOTE — Addendum Note (Signed)
Addended by: Christia Reading on: 03/16/2016 02:38 PM   Modules accepted: Orders

## 2016-03-16 NOTE — Addendum Note (Signed)
Addended by: Baldomero Lamy B on: 03/16/2016 02:17 PM   Modules accepted: Orders

## 2016-03-16 NOTE — Patient Instructions (Signed)
Left total knee replacement June 21  Total Knee Replacement Total knee replacement is a procedure to replace your knee joint with an artificial knee joint (prosthetic knee joint). The purpose of this surgery is to reduce pain and improve your knee function. LET [facility] CARE PROVIDER KNOW ABOUT:   Any allergies you have.  All medicines you are taking, including vitamins, herbs, eye drops, creams, and over-the-counter medicines.  Previous problems you or members of your family have had with the use of anesthetics.  Any blood disorders you have.  Previous surgeries you have had.  Medical conditions you have. RISKS AND COMPLICATIONS  Generally, total knee replacement is a safe procedure. However, problems can occur, including:  Loss of range of motion of the knee or instability of the knee.  Loosening of the prosthesis.  Infection.  Persistent pain. BEFORE THE PROCEDURE   Plan to have someone take you home after the procedure.  Do not eat or drink anything after midnight on the night before the procedure or as directed by your health care provider.  Ask your health care provider about:  Changing or stopping your regular medicines. This is especially important if you are taking diabetes medicines or blood thinners.  Taking medicines such as aspirin and ibuprofen. These medicines can thin your blood. Do not take these medicines before your procedure if your health care provider asks you not to.  Ask your health care provider about how your surgical site will be marked or identified.  You may be given antibiotic medicines to help prevent infection. PROCEDURE   To reduce your risk of infection:  Your health care team will wash or sanitize their hands.  Your skin will be washed with soap.  An IV tube will be inserted into one of your veins. You will be given one or more of the following:  A medicine that makes you drowsy (sedative).  A medicine that makes you fall  asleep (general anesthetic).  A medicine injected into your spine that numbs your body below the waist (spinal anesthetic).  A medicine to block feeling in your leg (nerve block) to help ease pain after surgery.  An incision will be made in your knee. Your surgeon will take out any damaged cartilage and bone by sawing off the damaged surfaces.  The surgeon will then put a new metal liner over the sawed-off portion of your thigh bone (femur) and a plastic liner over the sawed-off portion of one of the bones of your lower leg (tibia). This is to restore alignment and function to your knee. A plastic piece is often used to restore the surface of your knee cap. AFTER THE PROCEDURE   You will stay in a recovery area until your medicines wear off.  You may have drainage tubes to drain excess fluid from your knee. These tubes attach to a device that removes these fluids.  Once you are awake, stable, and taking fluids well, you will be taken to your hospital room.  You may be directed to take actions to help prevent blood clots. These may include:  Walking shortly after surgery, with someone assisting you. Moving around after surgery helps to improve blood flow.  Wearing compression stockings or using different types of devices.  You will receive physical therapy as prescribed by your health care provider.   This information is not intended to replace advice given to you by your health care provider. Make sure you discuss any questions you have with your health  care provider.   Document Released: 01/17/2001 Document Revised: 07/02/2015 Document Reviewed: 11/21/2011 Elsevier Interactive Patient Education [employer].

## 2016-03-16 NOTE — Progress Notes (Signed)
Patient ID: Brian [name], male   DOB: 02-12-1941, 75 y.o.   MRN: [id number]  Chief Complaint  Patient presents with  . Follow-up    Left knee Discuss TKA    HPI 75 year old male with persistent left knee pain giving way symptoms on stairs increased pain on stairs and increased pain with activities of daily living  ROS Denies fever   BP 166/98 mmHg  Pulse 74  Ht 5' 11.5" (1.816 m)  Wt 273 lb (123.832 kg)  BMI 37.55 kg/m2 Gen. appearance is normal grooming and hygiene Orientation to person place and time normal Mood normal Gait is normal without assistive device Mild peripheral edema in the lower legs Sensory exam shows normal sensation to palpation, pressure and soft touch Skin exam no lacerations ulcerations or erythema  Ortho Exam  Left knee joint line tenderness medially medial varus alignment no effusion flexion 120 A/P  Medical decision-making  Repeat films were not done previous films shows varus osteoarthritis end-stage  You have decided to proceed with knee replacement surgery. You have decided not to continue with nonoperative measures such as but not limited to oral medication, weight loss, activity modification, physical therapy, bracing, or injection.  We will perform the procedure commonly known as total knee replacement. Some of the risks associated with knee replacement surgery include but are not limited to Bleeding Infection Swelling Stiffness Blood clot Pain that persists even after surgery  Infection is especially devastating complication of knee surgery although rare. If infection does occur your implant will usually have to be removed and several surgeries and antibiotics will be needed to eradicate the infection prior to performing a repeat replacement.   If you're not comfortable with these risks and would like to continue with nonoperative treatment please let Dr. Aline Brochure know prior to your surgery.   Left total knee arthroplasty

## 2016-03-17 ENCOUNTER — Telehealth: Payer: Self-pay | Admitting: Orthopedic Surgery

## 2016-03-17 NOTE — Telephone Encounter (Signed)
Patient called with questions regarding some of the durable medical equipment he is to receive after total knee surgery scheduled 04/14/16.  Also, he states he will be out of town the entire week prior to the surgery; therefore, will need to re-schedule the pre-op appointment.  Please advise.  Ph#'s [phone] (Home) / [phone] [facility])

## 2016-03-17 NOTE — Telephone Encounter (Signed)
Spoke with patient, he was given number to short stay to call and reschedule his pre op appointment

## 2016-03-25 DIAGNOSIS — E1165 Type 2 diabetes mellitus with hyperglycemia: Secondary | ICD-10-CM | POA: Diagnosis not present

## 2016-03-25 DIAGNOSIS — E782 Mixed hyperlipidemia: Secondary | ICD-10-CM | POA: Diagnosis not present

## 2016-03-30 DIAGNOSIS — I10 Essential (primary) hypertension: Secondary | ICD-10-CM | POA: Diagnosis not present

## 2016-03-30 DIAGNOSIS — E1165 Type 2 diabetes mellitus with hyperglycemia: Secondary | ICD-10-CM | POA: Diagnosis not present

## 2016-03-30 DIAGNOSIS — D696 Thrombocytopenia, unspecified: Secondary | ICD-10-CM | POA: Diagnosis not present

## 2016-03-30 DIAGNOSIS — R42 Dizziness and giddiness: Secondary | ICD-10-CM | POA: Diagnosis not present

## 2016-04-05 ENCOUNTER — Telehealth: Payer: Self-pay | Admitting: Orthopedic Surgery

## 2016-04-05 ENCOUNTER — Other Ambulatory Visit: Payer: Self-pay | Admitting: *Deleted

## 2016-04-05 NOTE — Telephone Encounter (Signed)
Patient relays medication update: has had increase of Glypizide from 1x per day to 2x per day, per Dr Wende Neighbors and nurse practitioner, in reference to upcoming total knee replacement.  Has other questions, due to his going out of town tomorrow, 04/06/16, for remainder of this week.  States pre-op appointment is Monday, 04/12/16, and surgery is scheduled 04/14/16.

## 2016-04-05 NOTE — Telephone Encounter (Signed)
Spoke with patient, advised he would receive special instructions the day of his pre op

## 2016-04-09 ENCOUNTER — Other Ambulatory Visit (HOSPITAL_COMMUNITY): Payer: Medicare Other

## 2016-04-09 NOTE — Patient Instructions (Signed)
DAMARE URY  04/09/2016     @PREFPERIOPPHARMACY @   Your procedure is scheduled on  04/14/2016  Report to [facility] at  615  A.M.  Call this number if you have problems the morning of surgery:  [phone]   Remember:  Do not eat food or drink liquids after midnight.  Take these medicines the morning of surgery with A SIP OF WATER  Norvasc, mobic, avapro.   Do not wear jewelry, make-up or nail polish.  Do not wear lotions, powders, or perfumes.  You may wear deoderant.  Do not shave 48 hours prior to surgery.  Men may shave face and neck.  Do not bring valuables to the hospital.  [facility] is not responsible for any belongings or valuables.  Contacts, dentures or bridgework may not be worn into surgery.  Leave your suitcase in the car.  After surgery it may be brought to your room.  For patients admitted to the hospital, discharge time will be determined by your treatment team.  Patients discharged the day of surgery will not be allowed to drive home.   Name and phone number of your driver:   family Special instructions:  none  Please read over the following fact sheets that you were given. Pain Booklet, Coughing and Deep Breathing, Blood Transfusion Information, Total Joint Packet, MRSA Information, Surgical Site Infection Prevention, Anesthesia Post-op Instructions and Care and Recovery After Surgery      Total Knee Replacement Total knee replacement is a procedure to replace your knee joint with an artificial knee joint (prosthetic knee joint). The purpose of this surgery is to reduce pain and improve your knee function. LET [facility] CARE PROVIDER KNOW ABOUT:   Any allergies you have.  All medicines you are taking, including vitamins, herbs, eye drops, creams, and over-the-counter medicines.  Previous problems you or members of your family have had with the use of anesthetics.  Any blood disorders you have.  Previous surgeries you have  had.  Medical conditions you have. RISKS AND COMPLICATIONS  Generally, total knee replacement is a safe procedure. However, problems can occur, including:  Loss of range of motion of the knee or instability of the knee.  Loosening of the prosthesis.  Infection.  Persistent pain. BEFORE THE PROCEDURE   Plan to have someone take you home after the procedure.  Do not eat or drink anything after midnight on the night before the procedure or as directed by your health care provider.  Ask your health care provider about:  Changing or stopping your regular medicines. This is especially important if you are taking diabetes medicines or blood thinners.  Taking medicines such as aspirin and ibuprofen. These medicines can thin your blood. Do not take these medicines before your procedure if your health care provider asks you not to.  Ask your health care provider about how your surgical site will be marked or identified.  You may be given antibiotic medicines to help prevent infection. PROCEDURE   To reduce your risk of infection:  Your health care team will wash or sanitize their hands.  Your skin will be washed with soap.  An IV tube will be inserted into one of your veins. You will be given one or more of the following:  A medicine that makes you drowsy (sedative).  A medicine that makes you fall asleep (general anesthetic).  A medicine injected into your spine that numbs your body below  the waist (spinal anesthetic).  A medicine to block feeling in your leg (nerve block) to help ease pain after surgery.  An incision will be made in your knee. Your surgeon will take out any damaged cartilage and bone by sawing off the damaged surfaces.  The surgeon will then put a new metal liner over the sawed-off portion of your thigh bone (femur) and a plastic liner over the sawed-off portion of one of the bones of your lower leg (tibia). This is to restore alignment and function to your  knee. A plastic piece is often used to restore the surface of your knee cap. AFTER THE PROCEDURE   You will stay in a recovery area until your medicines wear off.  You may have drainage tubes to drain excess fluid from your knee. These tubes attach to a device that removes these fluids.  Once you are awake, stable, and taking fluids well, you will be taken to your hospital room.  You may be directed to take actions to help prevent blood clots. These may include:  Walking shortly after surgery, with someone assisting you. Moving around after surgery helps to improve blood flow.  Wearing compression stockings or using different types of devices.  You will receive physical therapy as prescribed by your health care provider.   This information is not intended to replace advice given to you by your health care provider. Make sure you discuss any questions you have with your health care provider.   Document Released: 01/17/2001 Document Revised: 07/02/2015 Document Reviewed: 11/21/2011 Elsevier Interactive Patient Education 2016 [place]. Total Knee Replacement, Care After These instructions give you information on caring for yourself after your procedure. Your doctor also may give you specific instructions. Call your doctor if you have any problems or questions after your procedure. HOME CARE  See a physical therapist as told by your doctor.  Do not take baths, swim, or use hot tubs until your doctor says that it is okay.  Take medicines as told by your doctor.  Do not lift or drive until your doctor says it is okay.  Use crutches, a walker, or a cane as told by your doctor.  If you were given a knee joint motion machine, use it as told.  Rest often, but move around as much as you can. Movement helps you to heal and helps to prevent problems.  Wear special socks (compression stockings) as told by your doctor. These socks help to prevent blood clots and lessen swelling in your  legs.  Follow instructions from your doctor about how to take care of your cut from surgery (incision). Make sure you:  Wash your hands with soap and water before you change your bandage (dressing). If you cannot use soap and water, use hand sanitizer.  Change your bandage as told by your doctor.  Leave stitches (sutures), skin glue, or skin tape (adhesive) strips in place. These may need to stay in place for 2 weeks or longer. If the tape strips get loose and curl up, you may trim the loose edges. Do not remove the strips completely unless your doctor says it is okay. GET HELP IF:  You have trouble breathing.  Your wound is red, puffy, or is getting more painful.  You have yellowish-white fluid (pus) coming from your wound.  You have a bad smell coming from your wound.  Your wound will not stop bleeding.  Your wound opens up after sutures (stitches) or staples are removed.  You  have a fever. GET HELP RIGHT AWAY IF:   You have a rash.  You have shortness of breath or chest pain.  You have pain or puffiness (swelling) in your calf or thigh.  Your ability to move your knee is decreasing rather than increasing.  Your knee pain is increasing daily.   This information is not intended to replace advice given to you by your health care provider. Make sure you discuss any questions you have with your health care provider.   Document Released: 01/03/2012 Document Revised: 07/02/2015 Document Reviewed: 01/03/2012 Elsevier Interactive Patient Education 2016 [employer] PATIENT INSTRUCTIONS POST-ANESTHESIA  IMMEDIATELY FOLLOWING SURGERY:  Do not drive or operate machinery for the first twenty four hours after surgery.  Do not make any important decisions for twenty four hours after surgery or while taking narcotic pain medications or sedatives.  If you develop intractable nausea and vomiting or a severe headache please notify your doctor immediately.  FOLLOW-UP:  Please make an  appointment with your surgeon as instructed. You do not need to follow up with anesthesia unless specifically instructed to do so.  WOUND CARE INSTRUCTIONS (if applicable):  Keep a dry clean dressing on the anesthesia/puncture wound site if there is drainage.  Once the wound has quit draining you may leave it open to air.  Generally you should leave the bandage intact for twenty four hours unless there is drainage.  If the epidural site drains for more than 36-48 hours please call the anesthesia department.  QUESTIONS?:  Please feel free to call your physician or the hospital operator if you have any questions, and they will be happy to assist you.

## 2016-04-12 ENCOUNTER — Encounter (HOSPITAL_COMMUNITY)
Admission: RE | Admit: 2016-04-12 | Discharge: 2016-04-12 | Disposition: A | Discharge status: Home / Self Care | Payer: Medicare Other | Source: Ambulatory Visit | Attending: Orthopedic Surgery | Admitting: Orthopedic Surgery

## 2016-04-12 ENCOUNTER — Telehealth: Payer: Self-pay | Admitting: Orthopedic Surgery

## 2016-04-12 ENCOUNTER — Other Ambulatory Visit: Payer: Self-pay

## 2016-04-12 ENCOUNTER — Encounter (HOSPITAL_COMMUNITY): Payer: Self-pay

## 2016-04-12 DIAGNOSIS — E785 Hyperlipidemia, unspecified: Secondary | ICD-10-CM | POA: Diagnosis not present

## 2016-04-12 DIAGNOSIS — G4733 Obstructive sleep apnea (adult) (pediatric): Secondary | ICD-10-CM | POA: Diagnosis not present

## 2016-04-12 DIAGNOSIS — M1712 Unilateral primary osteoarthritis, left knee: Secondary | ICD-10-CM | POA: Diagnosis not present

## 2016-04-12 DIAGNOSIS — I10 Essential (primary) hypertension: Secondary | ICD-10-CM | POA: Diagnosis not present

## 2016-04-12 DIAGNOSIS — Z8249 Family history of ischemic heart disease and other diseases of the circulatory system: Secondary | ICD-10-CM | POA: Diagnosis not present

## 2016-04-12 DIAGNOSIS — D696 Thrombocytopenia, unspecified: Secondary | ICD-10-CM | POA: Diagnosis not present

## 2016-04-12 DIAGNOSIS — Z79899 Other long term (current) drug therapy: Secondary | ICD-10-CM | POA: Diagnosis not present

## 2016-04-12 DIAGNOSIS — Z6838 Body mass index (BMI) 38.0-38.9, adult: Secondary | ICD-10-CM | POA: Diagnosis not present

## 2016-04-12 DIAGNOSIS — E119 Type 2 diabetes mellitus without complications: Secondary | ICD-10-CM | POA: Diagnosis not present

## 2016-04-12 DIAGNOSIS — Z880 Allergy status to penicillin: Secondary | ICD-10-CM | POA: Diagnosis not present

## 2016-04-12 DIAGNOSIS — Z886 Allergy status to analgesic agent status: Secondary | ICD-10-CM | POA: Diagnosis not present

## 2016-04-12 HISTORY — DX: Unspecified osteoarthritis, unspecified site: M19.90

## 2016-04-12 LAB — CBC WITH DIFFERENTIAL/PLATELET
BASOS PCT: 0 %
Basophils Absolute: 0 10*3/uL (ref 0.0–0.1)
EOS ABS: 0.3 10*3/uL (ref 0.0–0.7)
EOS PCT: 3 %
HCT: 43 % (ref 39.0–52.0)
Hemoglobin: 15.1 g/dL (ref 13.0–17.0)
Lymphocytes Relative: 22 %
Lymphs Abs: 2 10*3/uL (ref 0.7–4.0)
MCH: 31.1 pg (ref 26.0–34.0)
MCHC: 35.1 g/dL (ref 30.0–36.0)
MCV: 88.7 fL (ref 78.0–100.0)
MONO ABS: 0.7 10*3/uL (ref 0.1–1.0)
Monocytes Relative: 8 %
Neutro Abs: 6.1 10*3/uL (ref 1.7–7.7)
Neutrophils Relative %: 67 %
PLATELETS: 92 10*3/uL — AB (ref 150–400)
RBC: 4.85 MIL/uL (ref 4.22–5.81)
RDW: 12.8 % (ref 11.5–15.5)
WBC: 9.2 10*3/uL (ref 4.0–10.5)

## 2016-04-12 LAB — BASIC METABOLIC PANEL
ANION GAP: 6 (ref 5–15)
BUN: 15 mg/dL (ref 6–20)
CHLORIDE: 105 mmol/L (ref 101–111)
CO2: 22 mmol/L (ref 22–32)
Calcium: 8.7 mg/dL — ABNORMAL LOW (ref 8.9–10.3)
Creatinine, Ser: 0.74 mg/dL (ref 0.61–1.24)
GFR calc Af Amer: >60 mL/min (ref >60)
GLUCOSE: 152 mg/dL — AB (ref 65–99)
POTASSIUM: 3.6 mmol/L (ref 3.5–5.1)
Sodium: 133 mmol/L — ABNORMAL LOW (ref 135–145)

## 2016-04-12 LAB — APTT: APTT: 31 s (ref 24–37)

## 2016-04-12 LAB — PROTIME-INR
INR: 1.06 (ref 0.00–1.49)
PROTHROMBIN TIME: 14 s (ref 11.6–15.2)

## 2016-04-12 LAB — PREPARE RBC (CROSSMATCH)

## 2016-04-12 LAB — SURGICAL PCR SCREEN
MRSA, PCR: NEGATIVE
STAPHYLOCOCCUS AUREUS: NEGATIVE

## 2016-04-12 LAB — ABO/RH: ABO/RH(D): A POS

## 2016-04-12 NOTE — Telephone Encounter (Signed)
Brian [name] called this afternoon and wanted to know "what is standard mg for aspirin following surgery?".  He said that Dr. Aline Brochure told him to take the standard mg but he was unsure as to what that is.  Please advise Brian [name].  Thanks

## 2016-04-12 NOTE — Telephone Encounter (Signed)
Advised regular aspirin is 325mg , however, I wanted to clarify this is the dose you want him to take or 81 mg baby asa?

## 2016-04-12 NOTE — Progress Notes (Signed)
Patient given instructions with web site and code to sign up for "My Chart"  

## 2016-04-13 MED ORDER — TRANEXAMIC ACID 1000 MG/10ML IV SOLN
1000.0000 mg | INTRAVENOUS | Status: AC
  Administered 2016-04-14: 1000 mg via INTRAVENOUS
  Filled 2016-04-13: qty 10

## 2016-04-13 NOTE — H&P (Signed)
TOTAL KNEE ADMISSION H&P  Patient is being admitted for left total knee arthroplasty.  Subjective:  Chief Complaint:left knee pain.  HPI: Brian [name], 75 y.o. male, has a history of pain and functional disability in the left knee due to arthritis and has failed non-surgical conservative treatments for greater than 12 weeks to includeNSAID's and/or analgesics, corticosteriod injections, flexibility and strengthening excercises and activity modification.  Onset of symptoms was gradual, starting several years ago years ago with gradually worsening course since that time. The patient noted prior procedures on the knee to include  arthroscopy and menisectomy on the left knee(s).  Patient currently rates pain in the left knee(s) at 7 out of 10 with activity. Patient has night pain, worsening of pain with activity and weight bearing, pain that interferes with activities of daily living, pain with passive range of motion, crepitus and joint swelling.  Patient has evidence of subchondral sclerosis, periarticular osteophytes and joint space narrowing by imaging studies.     Patient Active Problem List   Diagnosis Date Noted  . Tendonitis, Achilles, right 08/27/2014  . Stiffness of joint, not elsewhere classified, lower leg 06/11/2014  . Pain in joint, lower leg 06/11/2014  . Abnormality of gait 06/11/2014  . S/P left knee arthroscopy 06/03/2014  . Derangement of posterior horn of medial meniscus 04/09/2014  . OA (osteoarthritis) of knee 04/09/2014  . Knee pain 04/09/2014  . THROMBOCYTOPENIA 05/12/2010  . AORTIC INSUFFICIENCY 05/12/2010  . OBESITY, UNSPECIFIED 05/11/2010  . HYPERTENSION 05/11/2010  . BRADYCARDIA 05/11/2010  . VERTIGO 05/11/2010  . UNSPECIFIED SLEEP APNEA 05/11/2010  . CHEST PAIN UNSPECIFIED 05/11/2010   Past Medical History  Diagnosis Date  . Atypical chest pain     Low-risk exercise stress Cardiolite EF 60% April 2008. Reportedly normal remote cardiac catheterization @  [place] EF 60%...nuclear....2008  . Mild aortic regurgitation with LV dilation by prior echocardiogram   . Thrombocytopenia (HCC)     mild follow Dr. Alonna Minium  . Obstructive sleep apnea     C-Pap compliant  . Hypertension   . Dyslipidemia   . Obesity   . Vertigo     Hospital 03/2010  . Bradycardia     hospital 03/2010 stopped beta blocker  . Complication of anesthesia   . PONV (postoperative nausea and vomiting)   . Diabetes mellitus without complication ([place])   . Arthritis     Past Surgical History  Procedure Laterality Date  . Cardiac catheterization    . Status post bilateral arthroscopic knee surgery    . Knee arthroscopy with medial menisectomy Left 05/31/2014    Procedure: LEFT KNEE ARTHROSCOPY WITH MEDIAL MENISECTOMY;  Surgeon: Carole Civil, MD;  Location: [facility];  Service: Orthopedics;  Laterality: Left;    No prescriptions prior to admission   Allergies  Allergen Reactions  . Asa [Aspirin] Other (See Comments)    Thins patient's blood too much.  . Penicillins Other (See Comments)    DIZZINESS Has patient had a PCN reaction causing immediate rash, facial/tongue/throat swelling, SOB or lightheadedness with hypotension: No Has patient had a PCN reaction causing severe rash involving mucus membranes or skin necrosis: No Has patient had a PCN reaction that required hospitalization No Has patient had a PCN reaction occurring within the last 10 years: No If all of the above answers are "NO", then may proceed with Cephalosporin use.     Social History  Substance Use Topics  . Smoking status: Never Smoker   . Smokeless tobacco: Not on  file  . Alcohol Use: Yes     Comment: wine on occasion    Family History  Problem Relation Age of Onset  . Heart attack Mother     fatal, cause of death     Review of Systems  Constitutional: Negative for fever and malaise/fatigue.  Eyes: Negative for blurred vision.  Respiratory: Negative for cough.   Cardiovascular: Negative for  chest pain.  Gastrointestinal: Negative for heartburn.  Genitourinary: Negative for dysuria.  Musculoskeletal: Negative for myalgias.  Skin: Negative for rash.  Neurological: Negative for dizziness and headaches.  Endo/Heme/Allergies: Negative for environmental allergies and polydipsia. Does not bruise/bleed easily.  Psychiatric/Behavioral: Negative for depression.    Objective:  Physical Exam   VS BP 150/83 mmHg  Pulse 67  Temp(Src) 98.3 F (36.8 C) (Oral)  Resp 18  Ht 5\' 11"  (1.803 m)  Wt 274 lb (124.286 kg)  BMI 38.23 kg/m2  SpO2 96%  This patient is awake alert and oriented 3 His mood and affect are normal He is well-groomed well-nourished, normal development His gait is antalgic favoring his left knee leg   His right and left upper extremity show no subluxation atrophy tremor or malalignment  Left knee is tender over the medial and lateral joint line there is a small effusion. His flexion 120 a small flexion contracture less than 5. A stable knee and anterior and posterior plane, coronal plane stability is normal. Strength is normal in quadriceps hamstrings.  Right knee no effusion no contracture subluxation atrophy or tremor   Vital signs in last 24 hours:    Labs: CBC Latest Ref Rng 04/12/2016 05/28/2014  WBC 4.0 - 10.5 K/uL 9.2 -  Hemoglobin 13.0 - 17.0 g/dL 15.1 15.5  Hematocrit 39.0 - 52.0 % 43.0 43.3  Platelets 150 - 400 K/uL 92(L) -    BMP Latest Ref Rng 04/12/2016 05/28/2014  Glucose 65 - 99 mg/dL 152(H) 159(H)  BUN 6 - 20 mg/dL 15 12  Creatinine 0.61 - 1.24 mg/dL 0.74 0.71  Sodium 135 - 145 mmol/L 133(L) 138  Potassium 3.5 - 5.1 mmol/L 3.6 3.8  Chloride 101 - 111 mmol/L 105 101  CO2 22 - 32 mmol/L 22 23  Calcium 8.9 - 10.3 mg/dL 8.7(L) 8.9       Estimated body mass index is 38.09 kg/(m^2) as calculated from the following:   Height as of 02/23/16: 5\' 11"  (1.803 m).   Weight as of 03/16/16: 273 lb (123.832 kg).   Imaging Review Plain  radiographs demonstrate moderate degenerative joint disease of the left knee(s). The overall alignment ismild varus. The bone quality appears to be good for age and reported activity level.  Assessment/Plan:  End stage arthritis, left knee   The patient history, physical examination, clinical judgment of the provider and imaging studies are consistent with end stage degenerative joint disease of the left knee(s) and total knee arthroplasty is deemed medically necessary. The treatment options including medical management, injection therapy arthroscopy and arthroplasty were discussed at length. The risks and benefits of total knee arthroplasty were presented and reviewed. The risks due to aseptic loosening, infection, stiffness, patella tracking problems, thromboembolic complications and other imponderables were discussed. The patient acknowledged the explanation, agreed to proceed with the plan and consent was signed. Patient is being admitted for inpatient treatment for surgery, pain control, PT, OT, prophylactic antibiotics, VTE prophylaxis, progressive ambulation and ADL's and discharge planning. The patient is planning to be discharged home with home health services

## 2016-04-14 ENCOUNTER — Telehealth: Payer: Self-pay | Admitting: Orthopedic Surgery

## 2016-04-14 ENCOUNTER — Inpatient Hospital Stay (HOSPITAL_COMMUNITY): Payer: Medicare Other

## 2016-04-14 ENCOUNTER — Encounter (HOSPITAL_COMMUNITY): Payer: Self-pay | Admitting: *Deleted

## 2016-04-14 ENCOUNTER — Inpatient Hospital Stay (HOSPITAL_COMMUNITY): Payer: Medicare Other | Admitting: Anesthesiology

## 2016-04-14 ENCOUNTER — Inpatient Hospital Stay (HOSPITAL_COMMUNITY)
Admission: RE | Admit: 2016-04-14 | Discharge: 2016-04-16 | DRG: 470 | Disposition: A | Discharge status: Home Health | Payer: Medicare Other | Source: Ambulatory Visit | Attending: Orthopedic Surgery | Admitting: Orthopedic Surgery

## 2016-04-14 ENCOUNTER — Encounter (HOSPITAL_COMMUNITY)
Admission: RE | Disposition: A | Discharge status: Home Health | Payer: Self-pay | Source: Ambulatory Visit | Attending: Orthopedic Surgery

## 2016-04-14 DIAGNOSIS — M1712 Unilateral primary osteoarthritis, left knee: Secondary | ICD-10-CM | POA: Diagnosis not present

## 2016-04-14 DIAGNOSIS — D696 Thrombocytopenia, unspecified: Secondary | ICD-10-CM | POA: Diagnosis present

## 2016-04-14 DIAGNOSIS — Z471 Aftercare following joint replacement surgery: Secondary | ICD-10-CM | POA: Diagnosis not present

## 2016-04-14 DIAGNOSIS — Z8249 Family history of ischemic heart disease and other diseases of the circulatory system: Secondary | ICD-10-CM | POA: Diagnosis not present

## 2016-04-14 DIAGNOSIS — I10 Essential (primary) hypertension: Secondary | ICD-10-CM | POA: Diagnosis not present

## 2016-04-14 DIAGNOSIS — Z886 Allergy status to analgesic agent status: Secondary | ICD-10-CM | POA: Diagnosis not present

## 2016-04-14 DIAGNOSIS — E119 Type 2 diabetes mellitus without complications: Secondary | ICD-10-CM | POA: Diagnosis present

## 2016-04-14 DIAGNOSIS — Z96652 Presence of left artificial knee joint: Secondary | ICD-10-CM | POA: Diagnosis not present

## 2016-04-14 DIAGNOSIS — Z7984 Long term (current) use of oral hypoglycemic drugs: Secondary | ICD-10-CM

## 2016-04-14 DIAGNOSIS — E785 Hyperlipidemia, unspecified: Secondary | ICD-10-CM | POA: Diagnosis present

## 2016-04-14 DIAGNOSIS — G4733 Obstructive sleep apnea (adult) (pediatric): Secondary | ICD-10-CM | POA: Diagnosis present

## 2016-04-14 DIAGNOSIS — I351 Nonrheumatic aortic (valve) insufficiency: Secondary | ICD-10-CM | POA: Diagnosis present

## 2016-04-14 DIAGNOSIS — Z79899 Other long term (current) drug therapy: Secondary | ICD-10-CM

## 2016-04-14 DIAGNOSIS — Z880 Allergy status to penicillin: Secondary | ICD-10-CM

## 2016-04-14 DIAGNOSIS — Z09 Encounter for follow-up examination after completed treatment for conditions other than malignant neoplasm: Secondary | ICD-10-CM

## 2016-04-14 DIAGNOSIS — M179 Osteoarthritis of knee, unspecified: Secondary | ICD-10-CM | POA: Diagnosis not present

## 2016-04-14 DIAGNOSIS — Z6838 Body mass index (BMI) 38.0-38.9, adult: Secondary | ICD-10-CM | POA: Diagnosis not present

## 2016-04-14 DIAGNOSIS — M25562 Pain in left knee: Secondary | ICD-10-CM | POA: Diagnosis not present

## 2016-04-14 HISTORY — PX: TOTAL KNEE ARTHROPLASTY: SHX125

## 2016-04-14 LAB — GLUCOSE, CAPILLARY
GLUCOSE-CAPILLARY: 163 mg/dL — AB (ref 65–99)
Glucose-Capillary: 231 mg/dL — ABNORMAL HIGH (ref 65–99)
Glucose-Capillary: 185 mg/dL — ABNORMAL HIGH (ref 65–99)
Glucose-Capillary: 216 mg/dL — ABNORMAL HIGH (ref 65–99)

## 2016-04-14 SURGERY — ARTHROPLASTY, KNEE, TOTAL
Anesthesia: Spinal | Site: Knee | Laterality: Left

## 2016-04-14 MED ORDER — MIDAZOLAM HCL 2 MG/2ML IJ SOLN
INTRAMUSCULAR | Status: AC
  Filled 2016-04-14: qty 2

## 2016-04-14 MED ORDER — VANCOMYCIN HCL 10 G IV SOLR
1500.0000 mg | INTRAVENOUS | Status: DC
  Filled 2016-04-14: qty 1500

## 2016-04-14 MED ORDER — OXYCODONE HCL 5 MG PO TABS
5.0000 mg | ORAL_TABLET | Freq: Once | ORAL | Status: AC
  Administered 2016-04-14: 5 mg via ORAL
  Filled 2016-04-14: qty 1

## 2016-04-14 MED ORDER — 0.9 % SODIUM CHLORIDE (POUR BTL) OPTIME
TOPICAL | Status: DC | PRN
  Administered 2016-04-14: 1000 mL

## 2016-04-14 MED ORDER — ONDANSETRON HCL 4 MG/2ML IJ SOLN
INTRAMUSCULAR | Status: AC
  Filled 2016-04-14: qty 2

## 2016-04-14 MED ORDER — HYDROMORPHONE HCL 1 MG/ML IJ SOLN
0.5000 mg | INTRAMUSCULAR | Status: DC | PRN
  Administered 2016-04-14 (×3): 0.5 mg via INTRAVENOUS
  Filled 2016-04-14 (×3): qty 1

## 2016-04-14 MED ORDER — MIDAZOLAM HCL 5 MG/5ML IJ SOLN
INTRAMUSCULAR | Status: DC | PRN
  Administered 2016-04-14: 2 mg via INTRAVENOUS

## 2016-04-14 MED ORDER — PHENOL 1.4 % MT LIQD
1.0000 | OROMUCOSAL | Status: DC | PRN
Start: 2016-04-14 — End: 2016-04-16

## 2016-04-14 MED ORDER — DOCUSATE SODIUM 100 MG PO CAPS
100.0000 mg | ORAL_CAPSULE | Freq: Two times a day (BID) | ORAL | Status: DC
  Administered 2016-04-14 – 2016-04-16 (×4): 100 mg via ORAL
  Filled 2016-04-14 (×4): qty 1

## 2016-04-14 MED ORDER — GLIPIZIDE ER 5 MG PO TB24
5.0000 mg | ORAL_TABLET | Freq: Two times a day (BID) | ORAL | Status: DC
  Administered 2016-04-14 – 2016-04-16 (×4): 5 mg via ORAL
  Filled 2016-04-14 (×4): qty 1

## 2016-04-14 MED ORDER — METOCLOPRAMIDE HCL 10 MG PO TABS: 5.0000 mg | ORAL_TABLET | Freq: Three times a day (TID) | ORAL | Status: DC | PRN

## 2016-04-14 MED ORDER — PROMETHAZINE HCL 25 MG/ML IJ SOLN
INTRAMUSCULAR | Status: AC
  Filled 2016-04-14: qty 1

## 2016-04-14 MED ORDER — ADULT MULTIVITAMIN W/MINERALS CH
1.0000 | ORAL_TABLET | Freq: Every day | ORAL | Status: DC
  Administered 2016-04-15 – 2016-04-16 (×2): 1 via ORAL
  Filled 2016-04-14 (×2): qty 1

## 2016-04-14 MED ORDER — SODIUM CHLORIDE 0.9 % IR SOLN
Status: DC | PRN
  Administered 2016-04-14: 3000 mL

## 2016-04-14 MED ORDER — POLYETHYLENE GLYCOL 3350 17 G PO PACK
17.0000 g | PACK | Freq: Every day | ORAL | Status: DC
  Administered 2016-04-15 – 2016-04-16 (×2): 17 g via ORAL
  Filled 2016-04-14 (×2): qty 1

## 2016-04-14 MED ORDER — IRBESARTAN 300 MG PO TABS
300.0000 mg | ORAL_TABLET | Freq: Every day | ORAL | Status: DC
  Administered 2016-04-15 – 2016-04-16 (×2): 300 mg via ORAL
  Filled 2016-04-14 (×2): qty 1

## 2016-04-14 MED ORDER — PREGABALIN 50 MG PO CAPS
50.0000 mg | ORAL_CAPSULE | Freq: Once | ORAL | Status: AC
  Administered 2016-04-14: 50 mg via ORAL
  Filled 2016-04-14: qty 1

## 2016-04-14 MED ORDER — PROMETHAZINE HCL 25 MG/ML IJ SOLN
6.2500 mg | Freq: Once | INTRAMUSCULAR | Status: AC
  Administered 2016-04-14: 6.25 mg via INTRAVENOUS

## 2016-04-14 MED ORDER — METOCLOPRAMIDE HCL 5 MG/ML IJ SOLN
INTRAMUSCULAR | Status: AC
  Filled 2016-04-14: qty 2

## 2016-04-14 MED ORDER — ONDANSETRON HCL 4 MG/2ML IJ SOLN
4.0000 mg | Freq: Once | INTRAMUSCULAR | Status: AC
  Administered 2016-04-14: 4 mg via INTRAVENOUS
  Filled 2016-04-14: qty 2

## 2016-04-14 MED ORDER — SODIUM CHLORIDE 0.9 % IV SOLN
INTRAVENOUS | Status: DC
Start: 2016-04-14 — End: 2016-04-15
  Administered 2016-04-14: 13:00:00 via INTRAVENOUS

## 2016-04-14 MED ORDER — WARFARIN SODIUM 2.5 MG PO TABS
2.5000 mg | ORAL_TABLET | Freq: Every day | ORAL | Status: DC
  Filled 2016-04-14: qty 1

## 2016-04-14 MED ORDER — DEXAMETHASONE SODIUM PHOSPHATE 10 MG/ML IJ SOLN
10.0000 mg | Freq: Once | INTRAMUSCULAR | Status: AC
Start: 2016-04-15 — End: 2016-04-15
  Administered 2016-04-15: 10 mg via INTRAVENOUS
  Filled 2016-04-14: qty 1

## 2016-04-14 MED ORDER — METHOCARBAMOL 1000 MG/10ML IJ SOLN
500.0000 mg | Freq: Once | INTRAMUSCULAR | Status: AC
  Administered 2016-04-14: 500 mg via INTRAVENOUS
  Filled 2016-04-14: qty 5

## 2016-04-14 MED ORDER — FENTANYL CITRATE (PF) 100 MCG/2ML IJ SOLN: 25.0000 ug | INTRAMUSCULAR | Status: DC | PRN

## 2016-04-14 MED ORDER — MIDAZOLAM HCL 2 MG/2ML IJ SOLN
1.0000 mg | INTRAMUSCULAR | Status: DC | PRN
  Administered 2016-04-14: 2 mg via INTRAVENOUS
  Filled 2016-04-14: qty 2

## 2016-04-14 MED ORDER — MAGNESIUM CITRATE PO SOLN: 1.0000 | Freq: Once | ORAL | Status: DC | PRN

## 2016-04-14 MED ORDER — BUPIVACAINE-EPINEPHRINE (PF) 0.5% -1:200000 IJ SOLN
INTRAMUSCULAR | Status: DC | PRN
Start: 2016-04-14 — End: 2016-04-14
  Administered 2016-04-14: 30 mL via PERINEURAL

## 2016-04-14 MED ORDER — VANCOMYCIN HCL 1000 MG IV SOLR
1000.0000 mg | INTRAVENOUS | Status: DC | PRN
  Administered 2016-04-14: 1500 mg via INTRAVENOUS

## 2016-04-14 MED ORDER — DIPHENHYDRAMINE HCL 12.5 MG/5ML PO ELIX: 12.5000 mg | ORAL_SOLUTION | ORAL | Status: DC | PRN

## 2016-04-14 MED ORDER — FENTANYL CITRATE (PF) 100 MCG/2ML IJ SOLN
INTRAMUSCULAR | Status: AC
  Filled 2016-04-14: qty 2

## 2016-04-14 MED ORDER — ONDANSETRON HCL 4 MG PO TABS: 4.0000 mg | ORAL_TABLET | Freq: Four times a day (QID) | ORAL | Status: DC | PRN

## 2016-04-14 MED ORDER — PROPOFOL 10 MG/ML IV BOLUS
INTRAVENOUS | Status: AC
  Filled 2016-04-14: qty 20

## 2016-04-14 MED ORDER — BISACODYL 10 MG RE SUPP: 10.0000 mg | Freq: Every day | RECTAL | Status: DC | PRN

## 2016-04-14 MED ORDER — METOCLOPRAMIDE HCL 5 MG/ML IJ SOLN: 5.0000 mg | Freq: Three times a day (TID) | INTRAMUSCULAR | Status: DC | PRN

## 2016-04-14 MED ORDER — WARFARIN - PHYSICIAN DOSING INPATIENT: Freq: Every day | Status: DC

## 2016-04-14 MED ORDER — CHLORHEXIDINE GLUCONATE 4 % EX LIQD: 60.0000 mL | Freq: Once | CUTANEOUS | Status: DC

## 2016-04-14 MED ORDER — HYDROCODONE-ACETAMINOPHEN 7.5-325 MG PO TABS
1.0000 | ORAL_TABLET | ORAL | Status: DC
  Administered 2016-04-14 – 2016-04-16 (×11): 1 via ORAL
  Filled 2016-04-14 (×11): qty 1

## 2016-04-14 MED ORDER — OXYCODONE HCL 5 MG PO TABS: 5.0000 mg | ORAL_TABLET | ORAL | Status: DC | PRN

## 2016-04-14 MED ORDER — ONDANSETRON HCL 4 MG/2ML IJ SOLN: 4.0000 mg | Freq: Four times a day (QID) | INTRAMUSCULAR | Status: DC | PRN

## 2016-04-14 MED ORDER — FENTANYL CITRATE (PF) 100 MCG/2ML IJ SOLN
25.0000 ug | INTRAMUSCULAR | Status: AC | PRN
  Administered 2016-04-14 (×2): 25 ug via INTRAVENOUS
  Filled 2016-04-14: qty 2

## 2016-04-14 MED ORDER — METHOCARBAMOL 1000 MG/10ML IJ SOLN
500.0000 mg | Freq: Four times a day (QID) | INTRAVENOUS | Status: DC | PRN
  Filled 2016-04-14: qty 5

## 2016-04-14 MED ORDER — METFORMIN HCL 500 MG PO TABS
500.0000 mg | ORAL_TABLET | Freq: Two times a day (BID) | ORAL | Status: DC
  Administered 2016-04-14 – 2016-04-16 (×4): 500 mg via ORAL
  Filled 2016-04-14 (×4): qty 1

## 2016-04-14 MED ORDER — PROPOFOL 500 MG/50ML IV EMUL
INTRAVENOUS | Status: DC | PRN
  Administered 2016-04-14: 35 ug/kg/min via INTRAVENOUS
  Administered 2016-04-14: 09:00:00 via INTRAVENOUS

## 2016-04-14 MED ORDER — CELECOXIB 100 MG PO CAPS
200.0000 mg | ORAL_CAPSULE | Freq: Two times a day (BID) | ORAL | Status: DC
  Administered 2016-04-14 – 2016-04-16 (×4): 200 mg via ORAL
  Filled 2016-04-14 (×4): qty 2

## 2016-04-14 MED ORDER — VANCOMYCIN HCL 10 G IV SOLR
1500.0000 mg | Freq: Two times a day (BID) | INTRAVENOUS | Status: AC
  Administered 2016-04-14: 1500 mg via INTRAVENOUS
  Filled 2016-04-14: qty 1500

## 2016-04-14 MED ORDER — METOCLOPRAMIDE HCL 5 MG/ML IJ SOLN
INTRAMUSCULAR | Status: DC | PRN
  Administered 2016-04-14: 10 mg via INTRAVENOUS

## 2016-04-14 MED ORDER — DEXAMETHASONE SODIUM PHOSPHATE 4 MG/ML IJ SOLN
4.0000 mg | INTRAMUSCULAR | Status: AC
  Administered 2016-04-14: 4 mg via INTRAVENOUS
  Filled 2016-04-14: qty 1

## 2016-04-14 MED ORDER — KETOROLAC TROMETHAMINE 15 MG/ML IJ SOLN
15.0000 mg | Freq: Four times a day (QID) | INTRAMUSCULAR | Status: AC
  Administered 2016-04-15 (×4): 15 mg via INTRAVENOUS
  Filled 2016-04-14 (×4): qty 1

## 2016-04-14 MED ORDER — ALUM & MAG HYDROXIDE-SIMETH 200-200-20 MG/5ML PO SUSP: 30.0000 mL | ORAL | Status: DC | PRN

## 2016-04-14 MED ORDER — METHOCARBAMOL 500 MG PO TABS: 500.0000 mg | ORAL_TABLET | Freq: Four times a day (QID) | ORAL | Status: DC | PRN

## 2016-04-14 MED ORDER — BUPIVACAINE IN DEXTROSE 0.75-8.25 % IT SOLN
INTRATHECAL | Status: DC | PRN
  Administered 2016-04-14: 15 mg via INTRATHECAL

## 2016-04-14 MED ORDER — SODIUM CHLORIDE 0.9 % IJ SOLN
INTRAMUSCULAR | Status: AC
  Filled 2016-04-14: qty 40

## 2016-04-14 MED ORDER — MENTHOL 3 MG MT LOZG: 1.0000 | LOZENGE | OROMUCOSAL | Status: DC | PRN

## 2016-04-14 MED ORDER — SODIUM CHLORIDE 0.9 % IV SOLN
INTRAVENOUS | Status: DC | PRN
  Administered 2016-04-14: 60 mL

## 2016-04-14 MED ORDER — AMLODIPINE BESYLATE 5 MG PO TABS
10.0000 mg | ORAL_TABLET | Freq: Every day | ORAL | Status: DC
  Administered 2016-04-15 – 2016-04-16 (×2): 10 mg via ORAL
  Filled 2016-04-14 (×2): qty 2

## 2016-04-14 MED ORDER — BUPIVACAINE LIPOSOME 1.3 % IJ SUSP
20.0000 mL | Freq: Once | INTRAMUSCULAR | Status: DC
  Filled 2016-04-14: qty 20

## 2016-04-14 MED ORDER — ONDANSETRON HCL 4 MG/2ML IJ SOLN: 4.0000 mg | Freq: Once | INTRAMUSCULAR | Status: DC | PRN

## 2016-04-14 MED ORDER — BUPIVACAINE LIPOSOME 1.3 % IJ SUSP
INTRAMUSCULAR | Status: AC
  Filled 2016-04-14: qty 20

## 2016-04-14 MED ORDER — FENTANYL CITRATE (PF) 100 MCG/2ML IJ SOLN
INTRAMUSCULAR | Status: DC | PRN
  Administered 2016-04-14 (×2): 25 ug via INTRAVENOUS

## 2016-04-14 MED ORDER — BUPIVACAINE-EPINEPHRINE (PF) 0.5% -1:200000 IJ SOLN
INTRAMUSCULAR | Status: AC
  Filled 2016-04-14: qty 30

## 2016-04-14 MED ORDER — SODIUM CHLORIDE 0.9 % IV SOLN
INTRAVENOUS | Status: DC | PRN
  Administered 2016-04-14: 08:00:00 via INTRAVENOUS

## 2016-04-14 MED ORDER — BUPIVACAINE IN DEXTROSE 0.75-8.25 % IT SOLN
INTRATHECAL | Status: AC
  Filled 2016-04-14: qty 2

## 2016-04-14 MED ORDER — ONDANSETRON HCL 4 MG/2ML IJ SOLN
INTRAMUSCULAR | Status: DC | PRN
  Administered 2016-04-14: 4 mg via INTRAVENOUS

## 2016-04-14 MED ORDER — LACTATED RINGERS IV SOLN
INTRAVENOUS | Status: DC
  Administered 2016-04-14 (×2): via INTRAVENOUS

## 2016-04-14 SURGICAL SUPPLY — 74 items
BAG HAMPER (MISCELLANEOUS) ×3 IMPLANT
BANDAGE ESMARK 6X9 LF (GAUZE/BANDAGES/DRESSINGS) ×1 IMPLANT
BIT DRILL 3.2X128 (BIT) IMPLANT
BIT DRILL 3.2X128MM (BIT)
BLADE HEX COATED 2.75 (ELECTRODE) ×3 IMPLANT
BLADE SAGITTAL 25.0X1.27X90 (BLADE) ×2 IMPLANT
BLADE SAGITTAL 25.0X1.27X90MM (BLADE) ×1
BNDG CMPR 9X6 STRL LF SNTH (GAUZE/BANDAGES/DRESSINGS) ×1
BNDG ESMARK 6X9 LF (GAUZE/BANDAGES/DRESSINGS) ×3
BOWL SMART MIX CTS (DISPOSABLE) IMPLANT
CAP KNEE TOTAL 3 SIGMA ×2 IMPLANT
CEMENT HV SMART SET (Cement) ×5 IMPLANT
CLOTH BEACON ORANGE TIMEOUT ST (SAFETY) ×3 IMPLANT
COOLER CRYO CUFF IC AND MOTOR (MISCELLANEOUS) ×3 IMPLANT
COVER LIGHT HANDLE STERIS (MISCELLANEOUS) ×6 IMPLANT
COVER PROBE W GEL 5X96 (DRAPES) ×1 IMPLANT
CUFF CRYO KNEE LG 20X31 COOLER (ORTHOPEDIC SUPPLIES) ×3 IMPLANT
CUFF CRYO KNEE18X23 MED (MISCELLANEOUS) ×1 IMPLANT
CUFF TOURNIQUET SINGLE 34IN LL (TOURNIQUET CUFF) ×2 IMPLANT
CUFF TOURNIQUET SINGLE 44IN (TOURNIQUET CUFF) IMPLANT
DECANTER SPIKE VIAL GLASS SM (MISCELLANEOUS) ×3 IMPLANT
DRAPE BACK TABLE (DRAPES) ×3 IMPLANT
DRAPE EXTREMITY T 121X128X90 (DRAPE) ×3 IMPLANT
DRESSING AQUACEL AG ADV 3.5X12 (MISCELLANEOUS) IMPLANT
DRSG AQUACEL AG ADV 3.5X10 (GAUZE/BANDAGES/DRESSINGS) ×3 IMPLANT
DRSG AQUACEL AG ADV 3.5X12 (MISCELLANEOUS) ×3
DURAPREP 26ML APPLICATOR (WOUND CARE) ×6 IMPLANT
ELECT REM PT RETURN 9FT ADLT (ELECTROSURGICAL) ×3
ELECTRODE REM PT RTRN 9FT ADLT (ELECTROSURGICAL) ×1 IMPLANT
EVACUATOR 3/16  PVC DRAIN (DRAIN) ×2
EVACUATOR 3/16 PVC DRAIN (DRAIN) ×1 IMPLANT
GLOVE BIOGEL M 7.0 STRL (GLOVE) ×2 IMPLANT
GLOVE BIOGEL PI IND STRL 6.5 (GLOVE) IMPLANT
GLOVE BIOGEL PI IND STRL 7.0 (GLOVE) ×1 IMPLANT
GLOVE BIOGEL PI INDICATOR 6.5 (GLOVE) ×2
GLOVE BIOGEL PI INDICATOR 7.0 (GLOVE) ×6
GLOVE ECLIPSE 6.5 STRL STRAW (GLOVE) ×4 IMPLANT
GLOVE EXAM NITRILE MD LF STRL (GLOVE) ×4 IMPLANT
GLOVE SKINSENSE NS SZ8.0 LF (GLOVE) ×2
GLOVE SKINSENSE STRL SZ8.0 LF (GLOVE) ×2 IMPLANT
GLOVE SS N UNI LF 8.5 STRL (GLOVE) ×3 IMPLANT
GOWN STRL REUS W/ TWL LRG LVL3 (GOWN DISPOSABLE) ×1 IMPLANT
GOWN STRL REUS W/TWL LRG LVL3 (GOWN DISPOSABLE) ×9 IMPLANT
GOWN STRL REUS W/TWL XL LVL3 (GOWN DISPOSABLE) ×3 IMPLANT
HANDPIECE INTERPULSE COAX TIP (DISPOSABLE) ×3
HOOD W/PEELAWAY (MISCELLANEOUS) ×12 IMPLANT
INST SET MAJOR BONE (KITS) ×3 IMPLANT
IV NS IRRIG 3000ML ARTHROMATIC (IV SOLUTION) ×3 IMPLANT
KIT BLADEGUARD II DBL (SET/KITS/TRAYS/PACK) ×3 IMPLANT
KIT ROOM TURNOVER APOR (KITS) ×3 IMPLANT
MANIFOLD NEPTUNE II (INSTRUMENTS) ×3 IMPLANT
MARKER SKIN DUAL TIP RULER LAB (MISCELLANEOUS) ×3 IMPLANT
NDL HYPO 21X1.5 SAFETY (NEEDLE) ×1 IMPLANT
NEEDLE HYPO 21X1.5 SAFETY (NEEDLE) ×3 IMPLANT
NS IRRIG 1000ML POUR BTL (IV SOLUTION) ×3 IMPLANT
PACK TOTAL JOINT (CUSTOM PROCEDURE TRAY) ×3 IMPLANT
PAD ARMBOARD 7.5X6 YLW CONV (MISCELLANEOUS) ×3 IMPLANT
PAD DANNIFLEX CPM (ORTHOPEDIC SUPPLIES) ×3 IMPLANT
PIN TROCAR 3 INCH (PIN) ×2 IMPLANT
SAW OSC TIP CART 19.5X105X1.3 (SAW) ×3 IMPLANT
SET BASIN LINEN APH (SET/KITS/TRAYS/PACK) ×3 IMPLANT
SET HNDPC FAN SPRY TIP SCT (DISPOSABLE) ×1 IMPLANT
STAPLER VISISTAT 35W (STAPLE) ×5 IMPLANT
SUT BRALON NAB BRD #1 30IN (SUTURE) ×6 IMPLANT
SUT MON AB 0 CT1 (SUTURE) ×3 IMPLANT
SUT MON AB 2-0 CT1 36 (SUTURE) ×2 IMPLANT
SYR 20CC LL (SYRINGE) ×4 IMPLANT
SYR 30ML LL (SYRINGE) ×3 IMPLANT
SYR BULB IRRIGATION 50ML (SYRINGE) ×3 IMPLANT
TOWEL OR 17X26 4PK STRL BLUE (TOWEL DISPOSABLE) ×3 IMPLANT
TOWER CARTRIDGE SMART MIX (DISPOSABLE) ×2 IMPLANT
TRAY FOLEY CATH SILVER 16FR (SET/KITS/TRAYS/PACK) ×3 IMPLANT
WATER STERILE IRR 1000ML POUR (IV SOLUTION) ×6 IMPLANT
YANKAUER SUCT 12FT TUBE ARGYLE (SUCTIONS) ×1 IMPLANT

## 2016-04-14 NOTE — Telephone Encounter (Signed)
Done

## 2016-04-14 NOTE — Anesthesia Procedure Notes (Signed)
Spinal Patient location during procedure: OR Start time: 04/14/2016 7:44 AM Staffing Resident/CRNA: Bernette Redbird J Preanesthetic Checklist Completed: patient identified, site marked, surgical consent, pre-op evaluation, timeout performed, IV checked, risks and benefits discussed and monitors and equipment checked Spinal Block Patient position: left lateral decubitus Prep: Betadine Patient monitoring: heart rate, cardiac monitor, continuous pulse ox and blood pressure Approach: left paramedian Location: L3-4 Injection technique: single-shot Needle Needle type: Spinocan  Needle gauge: 22 G Assessment Sensory level: T8 Additional Notes CSF slow and clear;     [id number]       24 Mar 2017

## 2016-04-14 NOTE — Op Note (Signed)
04/14/2016 Arther Abbott, MD   10:03 AM  Operative report for Brian [name]  75 year old male with end-stage osteoarthritis left knee failed conservative measures and opted for knee replacement understanding his risks and benefits of surgical treatment versus continued nonoperative treatment  Preop diagnosis osteoarthritis left knee  Postop diagnosis same  Procedure left total knee arthroplasty  Implants Sigma fixed bearing posterior stabilized total knee with a size 4 femur size 4 tibia a size 10 posterior stabilized polyethylene insert at 38 x 9 mm patellar button  Operative findings the patient's knee was in varus he had severe wear of the medial compartment soft tissue contractures of the medial soft tissue sleeve mild arthritis of the patella and lateral compartment lateral meniscus anterior cruciate ligament PCL were intact with medial meniscal remnant intact  I was assisted by Corrie Dandy and Almyra Free question last name  Surgeon was [facility]   Surgical dictation for left total knee   Marcaine with epinephrine 30 mL exparel 20 mL diluted with 40 mL of saline     The patient was identified in the preop holding area and the surgical site was confirmed as the left knee. Chart review and update were completed. The patient was taken to the operating room for spinal anesthesia. After successful spinal anesthesia Foley catheter was inserted. The patient was placed supine on the operating table. Vancomycin 1500 mg IV was given secondary to penicillin allergy   the left leg was prepped  and draped sterilely. Timeout was completed. The limb was then exsanguinated a  6 inch Esmarch. The tourniquet was elevated to 300 mmHg.   A midline incision was made and taken down to the extensor mechanism followed by medial arthrotomy. The patella was everted. Side effect to me was performed as needed. The osteophytes were resected.  Anterior cruciate ligament and PCL and medial and lateral  meniscus were resected.   a 3/8 inch drill bit was used to enter the femoral canal which was suctioned and irrigated until the fluid was clear. The distal femoral cut was set for 11 millimeter resection with a 5   Left Valgus angle. This cut was completed and checked for flatness.   the femur was then measured to a size 4.5.  The cutting block was placed to match the epicondyles and the 4 distal cuts were made.   the tibia was subluxated forward and the external alignment guide was placed. We removed 8 mm of bone from the higher lateral side. We set the guide for neutral varus valgus cut related to the  Mechanical axis of the tibia and for slope matching the patient's anatomy. Rotational alignment was set using the tibial tubercle, tibial spine and second metatarsal. The cutting block was pinned and the proximal tibia was resected.    spacer blocks were placed starting with a 10 mm insert to confirm equal flexion-extension gaps. The extension gap was still tight  We therefore recut the femur removing an additional 4 mm of bone. (However, the bone was so hard that this always readily skiving so approximate estimation is that we only removed additional 2 mm of bone) additional finishing femoral cuts were made  We then rechecked the extension gap and were able to get the 10 mm insert and with stability in flexion and extension at 10 mm  We placed the femoral notch cutting guide size 4  and resected the notch.   Trial implants replaced using appropriate size femur , appropriate size tibial baseplate which was measured  after the proximal tibia resection. Tibial rotation was set patella tracking was normal   The tibia was then punched per manufacture technique making sure to avoid internal rotation.   The patella measured a size 26;   We resected down to a size 16 using a size 38 x 9 button.   Final range of motion check was performed with the appropriate size trials as mentioned above. Satisfactory  reduction and motion were obtained.   Trial implants were removed. The bone was irrigated and dried and the cement was mixed on the back table  These implants were then cemented in place. Excess cement was removed. The cement was allowed to cure. Second irrigation was performed.    FInal range of motion check and stability check was completed  The wound was irrigated third time Hemovac drain was placed, extensor mechanism was closed with #1 Nurolon followed by 0 Monocryl and staples to reapproximate the skin edges and subcutaneous tissue.   Sterile dressing was applied  The patient was taken recovery in stable condition

## 2016-04-14 NOTE — Telephone Encounter (Signed)
Call from [place] at [facility], 3rd floor, requesting order for patient to have respiratory do CPAP - states patient has it at home. Her direct ph# is [phone]

## 2016-04-14 NOTE — Brief Op Note (Signed)
04/14/2016  9:52 AM  PATIENT:  Brian [name]  75 y.o. male  PRE-OPERATIVE DIAGNOSIS:  left knee osteoarthritis  POST-OPERATIVE DIAGNOSIS:  left knee osteoarthritis  PROCEDURE:  Procedure(s): LEFT TOTAL KNEE ARTHROPLASTY (Left)   depuy 61F 4T 38X 9 PATELLA 10 PS INSERT (SIGMA FB)  SURGEON:  Surgeon(s) and Role:    * Carole Civil, MD - Primary  PHYSICIAN ASSISTANT:   ASSISTANTS: debbie dallas and Almyra Free ?   ANESTHESIA:   spinal  EBL:  Total I/O In: 1200 [I.V.:1200] Out: 10 [Blood:10]  BLOOD ADMINISTERED:none  DRAINS: hemovac   LOCAL MEDICATIONS USED:  MARCAINE  W/ epi  , Amount: 30 ml and OTHER exparel 20/60  SPECIMEN:  No Specimen  DISPOSITION OF SPECIMEN:  N/A  COUNTS:  YES  TOURNIQUET:   Total Tourniquet Time Documented: Thigh (Left) - 98 minutes Total: Thigh (Left) - 98 minutes   DICTATION: .Viviann Spare Dictation  PLAN OF CARE: Admit to inpatient   PATIENT DISPOSITION:  PACU - hemodynamically stable.   Delay start of Pharmacological VTE agent (>24hrs) due to surgical blood loss or risk of bleeding: yes

## 2016-04-14 NOTE — Transfer of Care (Signed)
Immediate Anesthesia Transfer of Care Note  Patient: Brian [name]  Procedure(s) Performed: Procedure(s): LEFT TOTAL KNEE ARTHROPLASTY (Left)  Patient Location: PACU  Anesthesia Type:Spinal  Level of Consciousness: awake and patient cooperative  Airway & Oxygen Therapy: Patient Spontanous Breathing and Patient connected to face mask oxygen  Post-op Assessment: Report given to RN and Post -op Vital signs reviewed and stable  Post vital signs: Reviewed and stable  Last Vitals:  Filed Vitals:   04/14/16 0720 04/14/16 0725  BP: 139/78   Pulse:    Temp:    Resp: 22 16    Last Pain: There were no vitals filed for this visit.    Patients Stated Pain Goal: 4 [phone] [id number])  Complications: No apparent anesthesia complications

## 2016-04-14 NOTE — Anesthesia Postprocedure Evaluation (Signed)
Anesthesia Post Note  Patient: Brian [name]  Procedure(s) Performed: Procedure(s) (LRB): LEFT TOTAL KNEE ARTHROPLASTY (Left)  Patient location during evaluation: PACU Anesthesia Type: Spinal Level of consciousness: awake and alert and oriented Pain management: pain level controlled Vital Signs Assessment: post-procedure vital signs reviewed and stable Respiratory status: spontaneous breathing, nonlabored ventilation and respiratory function stable Cardiovascular status: blood pressure returned to baseline and stable Postop Assessment: patient able to bend at knees, spinal receding and no headache Anesthetic complications: no    Last Vitals:  Filed Vitals:   04/14/16 1045 04/14/16 1100  BP: 128/68 129/68  Pulse: 62   Temp:  36.4 C  Resp: 22     Last Pain: There were no vitals filed for this visit.               Leshay Desaulniers J

## 2016-04-14 NOTE — Telephone Encounter (Signed)
Regular 325

## 2016-04-14 NOTE — Anesthesia Preprocedure Evaluation (Signed)
Anesthesia Evaluation  Patient identified by MRN, date of birth, ID band Patient awake    Reviewed: Allergy & Precautions, H&P , NPO status , Patient's Chart, lab work & pertinent test results, reviewed documented beta blocker date and time   History of Anesthesia Complications (+) PONV and history of anesthetic complications  Airway Mallampati: II  TM Distance: >3 FB Neck ROM: Full    Dental  (+) Teeth Intact   Pulmonary sleep apnea and Continuous Positive Airway Pressure Ventilation ,    breath sounds clear to auscultation       Cardiovascular hypertension, Pt. on medications and Pt. on home beta blockers + Valvular Problems/Murmurs AI  Rhythm:Regular Rate:Normal     Neuro/Psych    GI/Hepatic negative GI ROS,   Endo/Other  diabetes, Well Controlled, Type 2, Oral Hypoglycemic AgentsMorbid obesity  Renal/GU      Musculoskeletal  (+) Arthritis , Osteoarthritis,    Abdominal   Peds  Hematology  (+) Blood dyscrasia ( Thrombocytopenia ), ,   Anesthesia Other Findings   Reproductive/Obstetrics                             Anesthesia Physical Anesthesia Plan  ASA: III  Anesthesia Plan: Spinal   Post-op Pain Management:    Induction:   Airway Management Planned: Simple Face Mask  Additional Equipment:   Intra-op Plan:   Post-operative Plan:   Informed Consent: I have reviewed the patients History and Physical, chart, labs and discussed the procedure including the risks, benefits and alternatives for the proposed anesthesia with the patient or authorized representative who has indicated his/her understanding and acceptance.     Plan Discussed with:   Anesthesia Plan Comments:         Anesthesia Quick Evaluation

## 2016-04-15 ENCOUNTER — Encounter (HOSPITAL_COMMUNITY): Payer: Self-pay | Admitting: Orthopedic Surgery

## 2016-04-15 LAB — CBC
HCT: 36.2 % — ABNORMAL LOW (ref 39.0–52.0)
HEMOGLOBIN: 12.7 g/dL — AB (ref 13.0–17.0)
MCH: 31.5 pg (ref 26.0–34.0)
MCHC: 35.1 g/dL (ref 30.0–36.0)
MCV: 89.8 fL (ref 78.0–100.0)
Platelets: 100 10*3/uL — ABNORMAL LOW (ref 150–400)
RBC: 4.03 MIL/uL — ABNORMAL LOW (ref 4.22–5.81)
RDW: 12.7 % (ref 11.5–15.5)
WBC: 14.4 10*3/uL — ABNORMAL HIGH (ref 4.0–10.5)

## 2016-04-15 LAB — GLUCOSE, CAPILLARY
Glucose-Capillary: 160 mg/dL — ABNORMAL HIGH (ref 65–99)
Glucose-Capillary: 187 mg/dL — ABNORMAL HIGH (ref 65–99)

## 2016-04-15 LAB — BASIC METABOLIC PANEL
Anion gap: 7 (ref 5–15)
BUN: 19 mg/dL (ref 6–20)
CALCIUM: 8.1 mg/dL — AB (ref 8.9–10.3)
CHLORIDE: 100 mmol/L — AB (ref 101–111)
CO2: 26 mmol/L (ref 22–32)
CREATININE: 0.95 mg/dL (ref 0.61–1.24)
GFR calc non Af Amer: >60 mL/min (ref >60)
Glucose, Bld: 157 mg/dL — ABNORMAL HIGH (ref 65–99)
Potassium: 3.9 mmol/L (ref 3.5–5.1)
SODIUM: 133 mmol/L — AB (ref 135–145)

## 2016-04-15 LAB — PROTIME-INR
INR: 1.24 (ref 0.00–1.49)
PROTHROMBIN TIME: 15.8 s — AB (ref 11.6–15.2)

## 2016-04-15 MED ORDER — ASPIRIN 325 MG PO TABS
325.0000 mg | ORAL_TABLET | Freq: Every day | ORAL | Status: DC
  Administered 2016-04-15 – 2016-04-16 (×2): 325 mg via ORAL
  Filled 2016-04-15 (×2): qty 1

## 2016-04-15 MED ORDER — COUMADIN BOOK
Freq: Once | Status: DC
  Filled 2016-04-15: qty 1

## 2016-04-15 MED ORDER — HYDROMORPHONE HCL 1 MG/ML IJ SOLN: 0.5000 mg | Freq: Four times a day (QID) | INTRAMUSCULAR | Status: DC | PRN

## 2016-04-15 MED ORDER — WARFARIN VIDEO
Freq: Once | Status: DC
Start: 2016-04-15 — End: 2016-04-16

## 2016-04-15 MED ORDER — PATIENT'S GUIDE TO USING COUMADIN BOOK
Freq: Once | Status: DC
  Filled 2016-04-15: qty 1

## 2016-04-15 NOTE — Progress Notes (Signed)
Physical Therapy Treatment Patient Details Name: Brian [name] MRN: [id number] DOB: 1941/10/10 Today's Date: 04/15/2016    History of Present Illness 75 yo M s/p L TKA on 04/14/2016.  PMH: Achilles tendonitis, B knee arthroscopy, L knee menisectomy, OA, thrombocytopenia, aortic insufficiency, obesity, HTN, bradycardia, vertigo, sleep apnea, atypical chest pain with EF of 60% mild aortic regurgitation, HTN, DM    PT Comments    Pt received in bed, family present, and pt is agreeable to PT tx.  PT expresses that he had no difficulty with exercises, and has already done them.  Pt demonstrated all functional mobility at mod (I) this afternoon, including stair negotiation.  Pt has met all acute PT goals at this time.    Follow Up Recommendations  Home health PT     Equipment Recommendations  Other (comment) (wheels received)    Recommendations for Other Services       Precautions / Restrictions Precautions Precautions: Knee Restrictions Weight Bearing Restrictions: Yes LLE Weight Bearing: Weight bearing as tolerated    Mobility  Bed Mobility Overal bed mobility: Modified Independent                Transfers Overall transfer level: Modified independent                  Ambulation/Gait Ambulation/Gait assistance: Modified independent (Device/Increase time) Ambulation Distance (Feet): 300 Feet Assistive device: Rolling walker (2 wheeled) Gait Pattern/deviations: Step-through pattern     General Gait Details: decreased terminal knee extension.    Stairs Stairs: Yes Stairs assistance: Modified independent (Device/Increase time) Stair Management: One rail Left;Step to pattern;Sideways Number of Stairs: 5    Wheelchair Mobility    Modified Rankin (Stroke Patients Only)       Balance Overall balance assessment: No apparent balance deficits (not formally assessed)                                  Cognition Arousal/Alertness:  Awake/alert Behavior During Therapy: WFL for tasks assessed/performed Overall Cognitive Status: Within Functional Limits for tasks assessed                      Exercises      General Comments        Pertinent Vitals/Pain Pain Assessment: No/denies pain    Home Living                      Prior Function            PT Goals (current goals can now be found in the care plan section) Acute Rehab PT Goals Patient Stated Goal: Pt wants to go home and get stronger.  PT Goal Formulation: With patient/family Time For Goal Achievement: 04/22/16 Potential to Achieve Goals: Good Progress towards PT goals: Progressing toward goals    Frequency  BID    PT Plan Current plan remains appropriate    Co-evaluation             End of Session Equipment Utilized During Treatment: Gait belt Activity Tolerance: Patient tolerated treatment well Patient left: in chair;with call bell/phone within reach;with family/visitor present     Time: 1545-1600 PT Time Calculation (min) (ACUTE ONLY): 15 min  Charges:  $Gait Training: 8-22 mins                    G Codes:  Beth Ahaana Rochette, PT, DPT X: [phone]

## 2016-04-15 NOTE — Progress Notes (Signed)
Patient ID: Brian [name], male   DOB: 01/21/1941, 75 y.o.   MRN: [id number] BP 130/64 mmHg  Pulse 60  Temp(Src) 98.6 F (37 C) (Oral)  Resp 20  Ht 5\' 11"  (1.803 m)  Wt 274 lb (124.286 kg)  BMI 38.23 kg/m2  SpO2 91%  Postop day 1 left total knee  CBC Latest Ref Rng 04/15/2016 04/12/2016 05/28/2014  WBC 4.0 - 10.5 K/uL 14.4(H) 9.2 -  Hemoglobin 13.0 - 17.0 g/dL 12.7(L) 15.1 15.5  Hematocrit 39.0 - 52.0 % 36.2(L) 43.0 43.3  Platelets 150 - 400 K/uL 100(L) 92(L) -    BMP Latest Ref Rng 04/15/2016 04/12/2016 05/28/2014  Glucose 65 - 99 mg/dL 157(H) 152(H) 159(H)  BUN 6 - 20 mg/dL 19 15 12   Creatinine 0.61 - 1.24 mg/dL 0.95 0.74 0.71  Sodium 135 - 145 mmol/L 133(L) 133(L) 138  Potassium 3.5 - 5.1 mmol/L 3.9 3.6 3.8  Chloride 101 - 111 mmol/L 100(L) 105 101  CO2 22 - 32 mmol/L 26 22 23   Calcium 8.9 - 10.3 mg/dL 8.1(L) 8.7(L) 8.9    He has excellent pain control. He did require Dilaudid to assist with his pain. He is in a CPM machine his initial range of motion was 10-65.  He ambulated more than 50 feet  His drain put out 70 last shift  He's doing well he stable his glucoses 157.  Plan discharge tomorrow with home health CPM machine warfarin therapy.

## 2016-04-15 NOTE — Clinical Social Work Note (Signed)
CSW received consult for possible SNF. OT evaluation noted and discussed with PT. Plan is for return home with home health. CSW will sign off.   Benay Pike, [place]

## 2016-04-15 NOTE — Progress Notes (Addendum)
Patient ordered to receive Coumadin this evening.  Patient questioned receiving the Coumadin.  Dr. Aline Brochure paged while in patient's room.  Dr. Aline Brochure responded and notified that "Dr. Sonny Dandy had told me to avoid Aspirin due to my blood count (platelets).  I feel more comfortable taking Aspirin instead of Coumadin."  Dr. Aline Brochure also notified of patient's platelet count.  Dr. Aline Brochure gave order to discontinue the Coumadin and start patient on Aspirin 325 mg daily starting today.  Patient verbalized understanding and agreement.  Patient also educated on signs/symptoms of bleeding and verbalized understanding.  Wife at bedside.

## 2016-04-15 NOTE — Evaluation (Signed)
Occupational Therapy Evaluation Patient Details Name: Brian [name] MRN: [id number] DOB: 1941/07/03 Today's Date: 04/15/2016    History of Present Illness Pt is a 75 y/o male s/p left TKA on 04/14/16   Clinical Impression   Pt awake, alert, oriented x4 this am, reports no pain only stiffness around left knee. Pt is mod independent for bed mobility, requires supervision/min guard for functional mobility tasks. Pt will required assistance for LB dressing and bathing in the immediate future. Wife will be present 24/7 to assist with ADL tasks as needed. No further OT services required at this time.     Follow Up Recommendations  No OT follow up;Supervision/Assistance - 24 hour    Equipment Recommendations  None recommended by OT       Precautions / Restrictions Precautions Precautions: None Restrictions Weight Bearing Restrictions: Yes Other Position/Activity Restrictions: WBAT      Mobility Bed Mobility Overal bed mobility: Modified Independent                Transfers Overall transfer level: Modified independent                         ADL Overall ADL's : Needs assistance/impaired                                     Functional mobility during ADLs: Supervision/safety General ADL Comments: Pt will require assistance for LB dressing and bathing due to acute pain      Vision Vision Assessment?: No apparent visual deficits          Pertinent Vitals/Pain Pain Assessment: 0-10 Pain Score: 0-No pain     Hand Dominance Right   Extremity/Trunk Assessment Upper Extremity Assessment Upper Extremity Assessment: Overall WFL for tasks assessed   Lower Extremity Assessment Lower Extremity Assessment: Defer to PT evaluation   Cervical / Trunk Assessment Cervical / Trunk Assessment: Normal   Communication Communication Communication: No difficulties   Cognition Arousal/Alertness: Awake/alert Behavior During Therapy: WFL for tasks  assessed/performed Overall Cognitive Status: Within Functional Limits for tasks assessed                                Home Living Family/patient expects to be discharged to:: Private residence Living Arrangements: Spouse/significant other Available Help at Discharge: Family;Available 24 hours/day Type of Home: Other(Comment) (Pt plans to stay in the basement where there is a bedroom) Home Access: Stairs to enter Entrance Stairs-Number of Steps: 0 going in the back, or 3-4 steps up going in the front with HR on both sides.    Home Layout: Multi-level Alternate Level Stairs-Number of Steps: bedroom is upstairs.    Bathroom Shower/Tub: Teacher, early years/pre: Standard Bathroom Accessibility: Yes   Home Equipment: Environmental consultant - standard          Prior Functioning/Environment    Gait / Transfers Assistance Needed: independent.  ADL's / Homemaking Assistance Needed: Independent   Comments: Retired from [employer]    OT Diagnosis: Acute pain   OT Problem List: Pain    End of Session    Activity Tolerance: Patient tolerated treatment well Patient left: in chair;with call bell/phone within reach   Time: 0824-0841 OT Time Calculation (min): 17 min Charges:  OT General Charges $OT Visit: 1 Procedure OT Evaluation $OT Eval Low Complexity: 1  Procedure  Guadelupe Sabin, OTR/L  [phone] 04/15/2016, 9:27 AM

## 2016-04-15 NOTE — Evaluation (Signed)
Physical Therapy Evaluation Patient Details Name: Brian [name] MRN: [id number] DOB: Mar 17, 1941 Today's Date: 04/15/2016   History of Present Illness  75 yo M s/p L TKA on 04/14/2016.  PMH: Achilles tendonitis, B knee arthroscopy, L knee menisectomy, OA, thrombocytopenia, aortic insufficiency, obesity, HTN, bradycardia, vertigo, sleep apnea, atypical chest pain with EF of 60% mild aortic regurgitation, HTN, DM  Clinical Impression  Pt received sitting up in the chair, wife present, and pt is agreeable to PT evaluation.  Pt is normally independent with ADL's, IADL's, and ambulation without DME.  Today during PT he demonstrated exercises with good technique, and transferred sit<>stand at Mod (I) with need for vc's for hand placement.  He ambulated 150ft with RW and supervision/min guard.  Upon return to the room, pt was assisted into the bed, and CPM fitted.  Pt instructed on use of CPM.  Handout given for education on total joint replacement, as well as handout for TKA exercises.  Will progress pt with steps on next visit.     Follow Up Recommendations Home health PT    Equipment Recommendations  Other (comment) (Wheels for his standard walker. )    Recommendations for Other Services       Precautions / Restrictions Precautions Precautions: Knee Restrictions Weight Bearing Restrictions: Yes LLE Weight Bearing: Weight bearing as tolerated Other Position/Activity Restrictions: WBAT      Mobility  Bed Mobility Overal bed mobility: Modified Independent                Transfers Overall transfer level: Modified independent Equipment used: Rolling walker (2 wheeled)             General transfer comment: vc's for hand placement.   Ambulation/Gait Ambulation/Gait assistance: Min guard;Supervision Ambulation Distance (Feet): 100 Feet Assistive device: Rolling walker (2 wheeled) Gait Pattern/deviations: Step-through pattern;Decreased stance time - left     General Gait  Details: decreased terminal knee extension.   Stairs            Wheelchair Mobility    Modified Rankin (Stroke Patients Only)       Balance Overall balance assessment: No apparent balance deficits (not formally assessed)                                           Pertinent Vitals/Pain Pain Assessment: 0-10 Pain Score: 0-No pain    Home Living Family/patient expects to be discharged to:: Private residence Living Arrangements: Spouse/significant other Available Help at Discharge: Family;Available 24 hours/day Type of Home: Other(Comment) (Pt plans to stay in the basement where there is a bedroom) Home Access: Stairs to enter   Entrance Stairs-Number of Steps: 0 going in the back, or 3-4 steps up going in the front with HR on both sides.  Home Layout: Multi-level Home Equipment: Walker - standard      Prior Function     Gait / Transfers Assistance Needed: independent.   ADL's / Homemaking Assistance Needed: Independent  Comments: Retired from [employer]: Right    Extremity/Trunk Assessment   Upper Extremity Assessment: Overall WFL for tasks assessed           Lower Extremity Assessment: LLE deficits/detail   LLE Deficits / Details: Generalized weakness due to recent surgery.  Strength is grossly 3/5.  Cervical / Trunk Assessment: Normal  Communication  Communication: No difficulties  Cognition Arousal/Alertness: Awake/alert Behavior During Therapy: WFL for tasks assessed/performed Overall Cognitive Status: Within Functional Limits for tasks assessed                      General Comments      Exercises Total Joint Exercises Ankle Circles/Pumps: AROM;Both;10 reps;Seated Quad Sets: Strengthening;Both;10 reps;Seated Gluteal Sets: Strengthening;Both;10 reps;Seated Short Arc Quad: Strengthening;Both;10 reps;Seated Heel Slides: AROM;Both;10 reps;Seated Hip ABduction/ADduction:  Strengthening;Both;10 reps;Seated Goniometric ROM: L knee -1 degree from full extension, and 80* of flexion.       Assessment/Plan    PT Assessment Patient needs continued PT services  PT Diagnosis Difficulty walking;Abnormality of gait;Generalized weakness   PT Problem List Decreased strength;Decreased range of motion;Decreased activity tolerance;Decreased balance;Decreased mobility;Decreased knowledge of use of DME;Decreased safety awareness;Decreased knowledge of precautions;Obesity  PT Treatment Interventions DME instruction;Gait training;Stair training;Functional mobility training;Therapeutic activities;Therapeutic exercise;Balance training;Patient/family education   PT Goals (Current goals can be found in the Care Plan section) Acute Rehab PT Goals Patient Stated Goal: Pt wants to go home and get stronger.  PT Goal Formulation: With patient/family Time For Goal Achievement: 04/22/16 Potential to Achieve Goals: Good    Frequency BID   Barriers to discharge        Co-evaluation               End of Session Equipment Utilized During Treatment: Gait belt Activity Tolerance: Patient tolerated treatment well Patient left: in bed;in CPM      Functional Assessment Tool Used: [employer] "6-clicks"  Functional Limitation: Mobility: Walking and moving around Mobility: Walking and Moving Around Current Status [phone]): At least 1 percent but less than 20 percent impaired, limited or restricted Mobility: Walking and Moving Around Goal Status [phone]): 0 percent impaired, limited or restricted    Time: 0854-0950 PT Time Calculation (min) (ACUTE ONLY): 56 min   Charges:   PT Evaluation $PT Eval Low Complexity: 1 Procedure PT Treatments $Gait Training: 8-22 mins $Therapeutic Exercise: 8-22 mins $Therapeutic Activity: 8-22 mins   PT G Codes:   PT G-Codes **NOT FOR INPATIENT CLASS** Functional Assessment Tool Used: [employer] "6-clicks"   Functional Limitation: Mobility: Walking and moving around Mobility: Walking and Moving Around Current Status [phone]): At least 1 percent but less than 20 percent impaired, limited or restricted Mobility: Walking and Moving Around Goal Status [phone]): 0 percent impaired, limited or restricted    Beth Talonda Artist, PT, DPT X: [phone]

## 2016-04-15 NOTE — Progress Notes (Signed)
Patient dangled his legs on the side of the bed for approximately 30 minutes with out complaints.

## 2016-04-15 NOTE — Progress Notes (Signed)
Foley cath, removed per MD order.  Patient tolerated well.

## 2016-04-15 NOTE — Care Management Note (Signed)
Case Management Note  Patient Details  Name: Brian [name] MRN: [id number] Date of Birth: Oct 07, 1941  Subjective/Objective: Patient is here post knee replacement. Patient is from home with wife. He is independent with ADL's. He has PCP, Dr. [name] Crane. He has medicare.    He has a CPAP machine with [facility] and would like to use [facility] for his home health and DME.          Action/Plan: [place] Calix notified of patient DME orders and Home health orders. She will obtain information from chart.   Expected Discharge Date:        04/16/2016          Expected Discharge Plan:  [place]  In-House Referral:     Discharge planning Services  CM Consult  Post Acute Care Choice:  Durable Medical Equipment, Home Health Choice offered to:  Patient  DME Arranged:  3-N-1, CPM, Walker rolling DME Agency:  [place]:  RN, PT [facility] Agency:  [place]  Status of Service:  In process, will continue to follow  If discussed at Long Length of Stay Meetings, dates discussed:    Additional Comments:  Dorthie Santini, Chauncey Reading, RN 04/15/2016, 10:34 AM

## 2016-04-15 NOTE — Progress Notes (Signed)
Patient asking if he needed to go back on the CPM machine tonight.  Looked through the orders and Physical Therapy's notes and didn't not see where the patient needed to go back on the CPM tonight.  Told the patient that if he felt like he needed more time on the machine that I would contact the doctor and see what he says.  Patient stated that he was ok without the machine right now and he would let me know if he changes his mind.

## 2016-04-15 NOTE — Anesthesia Postprocedure Evaluation (Signed)
Anesthesia Post Note  Patient: JAHMARLEY CUTRER  Procedure(s) Performed: Procedure(s) (LRB): LEFT TOTAL KNEE ARTHROPLASTY (Left)  Patient location during evaluation: Nursing Unit Anesthesia Type: Spinal Level of consciousness: awake and alert, oriented and patient cooperative Pain management: pain level controlled Vital Signs Assessment: post-procedure vital signs reviewed and stable Respiratory status: spontaneous breathing, nonlabored ventilation and respiratory function stable Cardiovascular status: blood pressure returned to baseline and stable Postop Assessment: no backache, patient able to bend at knees, no signs of nausea or vomiting and adequate PO intake Anesthetic complications: no    Last Vitals:  Filed Vitals:   04/14/16 2137 04/15/16 0508  BP:  130/64  Pulse: 75 60  Temp:  37 C  Resp: 18 20    Last Pain:  Filed Vitals:   04/15/16 0624  PainSc: Asleep                 Ava Tangney J

## 2016-04-15 NOTE — Progress Notes (Signed)
Inpatient Diabetes Program Recommendations  AACE/ADA: New Consensus Statement on Inpatient Glycemic Control (2015)  Target Ranges:  Prepandial:   less than 140 mg/dL      Peak postprandial:   less than 180 mg/dL (1-2 hours)      Critically ill patients:  140 - 180 mg/dL   Results for Brian [name], Brian [name] (MRN [id number]) as of 04/15/2016 08:56  Ref. Range 04/14/2016 06:59 04/14/2016 10:11 04/14/2016 16:06 04/14/2016 20:37 04/15/2016 07:32  Glucose-Capillary Latest Ref Range: 65-99 mg/dL 163 (H) 231 (H) 185 (H) 216 (H) 160 (H)   Review of Glycemic Control  Diabetes history: DM2 Outpatient Diabetes medications: Glipizide XL 5 mg BID, Metformin 500 mg BID Current orders for Inpatient glycemic control: CBGs Q8H, Metformin 500 mg BID, Glipizide XL 5 mg BID  Inpatient Diabetes Program Recommendations: Correction (SSI): While inpatient, pelase consider ordering CBGs wtih Novolog correction scale ACHS. Diet: Please change diet from Regular to Carb Modified.  Thanks, Barnie Alderman, RN, MSN, CDE Diabetes Coordinator Inpatient Diabetes Program [phone] (Team Pager from [place] to [place]) [phone] ([facility] office) [phone] [facility] office) [phone] [facility] office)

## 2016-04-16 LAB — PROTIME-INR
INR: 1.09 (ref 0.00–1.49)
Prothrombin Time: 14.3 seconds (ref 11.6–15.2)

## 2016-04-16 LAB — CBC
HEMATOCRIT: 36.5 % — AB (ref 39.0–52.0)
Hemoglobin: 12.9 g/dL — ABNORMAL LOW (ref 13.0–17.0)
MCH: 31.3 pg (ref 26.0–34.0)
MCHC: 35.3 g/dL (ref 30.0–36.0)
MCV: 88.6 fL (ref 78.0–100.0)
PLATELETS: 124 10*3/uL — AB (ref 150–400)
RBC: 4.12 MIL/uL — ABNORMAL LOW (ref 4.22–5.81)
RDW: 12.7 % (ref 11.5–15.5)
WBC: 15.7 10*3/uL — AB (ref 4.0–10.5)

## 2016-04-16 MED ORDER — HYDROCODONE-ACETAMINOPHEN 7.5-325 MG PO TABS: 1.0000 | ORAL_TABLET | ORAL | Status: DC

## 2016-04-16 MED ORDER — DOCUSATE SODIUM 100 MG PO CAPS: 100.0000 mg | ORAL_CAPSULE | Freq: Two times a day (BID) | ORAL | Status: DC

## 2016-04-16 MED ORDER — ASPIRIN 325 MG PO TABS: 325.0000 mg | ORAL_TABLET | Freq: Every day | ORAL | Status: DC

## 2016-04-16 NOTE — Discharge Summary (Signed)
Physician Discharge Summary  Patient ID: Brian [name] MRN: [id number] DOB/AGE: July 05, 1941 75 y.o.  Admit date: 04/14/2016 Discharge date: 04/16/2016  Admission Diagnoses: Osteoarthritis left knee  Discharge Diagnoses: Same Active Problems:   Primary osteoarthritis of left knee   Discharged Condition: good  Hospital Course:  Hospital day 1 [id number] uncomplicated left total knee with spinal anesthesia. Implant: [place] stigma fixed-bearing size 4 femur size 4 tibia size 10 PS insert size 38 x 9 mm patella  Hospital day 2 the patient underwent physical therapy tolerated that well walked over 300 feet and knee flexion 80  Hospital day 3 patient is afebrile vital signs are stable. Incision is clean dry and intact. Calf is supple Homans sign is negative   Consults: None  Significant Diagnostic Studies: labs:  CBC Latest Ref Rng 04/16/2016 04/15/2016 04/12/2016  WBC 4.0 - 10.5 K/uL 15.7(H) 14.4(H) 9.2  Hemoglobin 13.0 - 17.0 g/dL 12.9(L) 12.7(L) 15.1  Hematocrit 39.0 - 52.0 % 36.5(L) 36.2(L) 43.0  Platelets 150 - 400 K/uL 124(L) 100(L) 92(L)    BMP Latest Ref Rng 04/15/2016 04/12/2016 05/28/2014  Glucose 65 - 99 mg/dL 157(H) 152(H) 159(H)  BUN 6 - 20 mg/dL 19 15 12   Creatinine 0.61 - 1.24 mg/dL 0.95 0.74 0.71  Sodium 135 - 145 mmol/L 133(L) 133(L) 138  Potassium 3.5 - 5.1 mmol/L 3.9 3.6 3.8  Chloride 101 - 111 mmol/L 100(L) 105 101  CO2 22 - 32 mmol/L 26 22 23   Calcium 8.9 - 10.3 mg/dL 8.1(L) 8.7(L) 8.9      Discharge Exam: Blood pressure 167/86, pulse 62, temperature 97.6 F (36.4 C), temperature source Oral, resp. rate 18, height 5\' 11"  (1.803 m), weight 274 lb (124.286 kg), SpO2 96 %.   Disposition: 01-Home or Self Care  Discharge Instructions    CPM    Complete by:  As directed   Continuous passive motion machine (CPM):      Use the CPM from 0 to 80 for 8 hours per day.      You may increase by 10 per day.  You may break it up into 2 or 3 sessions per day.  Use CPM for 2 weeks or until you are told to stop.     Call MD / Call 911    Complete by:  As directed   If you experience chest pain or shortness of breath, CALL 911 and be transported to the hospital emergency room.  If you develope a fever above 101 F, pus (white drainage) or increased drainage or redness at the wound, or calf pain, call your surgeon's office.     Change dressing    Complete by:  As directed   Do not Change dressing     Constipation Prevention    Complete by:  As directed   Drink plenty of fluids.  Prune juice may be helpful.  You may use a stool softener, such as Colace (over the counter) 100 mg twice a day.  Use MiraLax (over the counter) for constipation as needed.     Diet - low sodium heart healthy    Complete by:  As directed      Discharge instructions    Complete by:  As directed   Use ice cuff as needed     Do not put a pillow under the knee. Place it under the heel.    Complete by:  As directed      Driving restrictions    Complete by:  As directed  No driving for 2 weeks     For home use only DME Continuous passive motion machine    Complete by:  As directed   2 weeks 8 hrs per day  0-80 increase 10 per day     Increase activity slowly as tolerated    Complete by:  As directed      TED hose    Complete by:  As directed   Use stockings (TED hose) for 2 weeks on both leg(s).  You may remove them at night for sleeping.            Medication List    STOP taking these medications        ibuprofen 200 MG tablet  Commonly known as:  ADVIL,MOTRIN     meloxicam 15 MG tablet  Commonly known as:  MOBIC      TAKE these medications        amLODipine 10 MG tablet  Commonly known as:  NORVASC  Take 10 mg by mouth daily.     aspirin 325 MG tablet  Take 1 tablet (325 mg total) by mouth daily.     docusate sodium 100 MG capsule  Commonly known as:  COLACE  Take 1 capsule (100 mg total) by mouth 2 (two) times daily.     glipiZIDE 5 MG 24 hr  tablet  Commonly known as:  GLUCOTROL XL  Take 5 mg by mouth 2 (two) times daily.     GLUCOSAMINE 1500 COMPLEX PO  Take 1 tablet by mouth 2 (two) times daily.     GLUCOSAMINE CHONDROITIN MSM PO  Take 1 tablet by mouth 2 (two) times daily.     HYDROcodone-acetaminophen 7.5-325 MG tablet  Commonly known as:  NORCO  Take 1 tablet by mouth every 4 (four) hours.     irbesartan 300 MG tablet  Commonly known as:  AVAPRO  Take 300 mg by mouth daily.     metFORMIN 500 MG tablet  Commonly known as:  GLUCOPHAGE  Take 500 mg by mouth 2 (two) times daily with a meal.     multivitamin tablet  Take 1 tablet by mouth daily.           Follow-up Information    Follow up with Arther Abbott, MD. Go on 04/21/2016.   Specialties:  Orthopedic Surgery, Radiology   Why:  For wound re-check   Contact information:   [address] [place] [address] [phone]       Signed: Arther Abbott 04/16/2016, 7:34 AM

## 2016-04-16 NOTE — Progress Notes (Signed)
Patient with orders to be discharge home. Discharge instructions given, patient verbalized understanding. Patient stable. Patient left in private vehicle with spouse.  

## 2016-04-17 DIAGNOSIS — Z96652 Presence of left artificial knee joint: Secondary | ICD-10-CM | POA: Diagnosis not present

## 2016-04-17 DIAGNOSIS — Z794 Long term (current) use of insulin: Secondary | ICD-10-CM | POA: Diagnosis not present

## 2016-04-17 DIAGNOSIS — I10 Essential (primary) hypertension: Secondary | ICD-10-CM | POA: Diagnosis not present

## 2016-04-17 DIAGNOSIS — E119 Type 2 diabetes mellitus without complications: Secondary | ICD-10-CM | POA: Diagnosis not present

## 2016-04-17 DIAGNOSIS — Z6838 Body mass index (BMI) 38.0-38.9, adult: Secondary | ICD-10-CM | POA: Diagnosis not present

## 2016-04-17 DIAGNOSIS — E669 Obesity, unspecified: Secondary | ICD-10-CM | POA: Diagnosis not present

## 2016-04-17 DIAGNOSIS — Z7984 Long term (current) use of oral hypoglycemic drugs: Secondary | ICD-10-CM | POA: Diagnosis not present

## 2016-04-17 DIAGNOSIS — Z471 Aftercare following joint replacement surgery: Secondary | ICD-10-CM | POA: Diagnosis not present

## 2016-04-17 DIAGNOSIS — Z5181 Encounter for therapeutic drug level monitoring: Secondary | ICD-10-CM | POA: Diagnosis not present

## 2016-04-19 DIAGNOSIS — E119 Type 2 diabetes mellitus without complications: Secondary | ICD-10-CM | POA: Diagnosis not present

## 2016-04-19 DIAGNOSIS — I10 Essential (primary) hypertension: Secondary | ICD-10-CM | POA: Diagnosis not present

## 2016-04-19 DIAGNOSIS — Z794 Long term (current) use of insulin: Secondary | ICD-10-CM | POA: Diagnosis not present

## 2016-04-19 DIAGNOSIS — Z96652 Presence of left artificial knee joint: Secondary | ICD-10-CM | POA: Diagnosis not present

## 2016-04-19 DIAGNOSIS — Z471 Aftercare following joint replacement surgery: Secondary | ICD-10-CM | POA: Diagnosis not present

## 2016-04-19 DIAGNOSIS — Z7984 Long term (current) use of oral hypoglycemic drugs: Secondary | ICD-10-CM | POA: Diagnosis not present

## 2016-04-19 LAB — TYPE AND SCREEN
ABO/RH(D): A POS
Antibody Screen: NEGATIVE
UNIT DIVISION: 0
Unit division: 0

## 2016-04-21 ENCOUNTER — Encounter: Payer: Self-pay | Admitting: Orthopedic Surgery

## 2016-04-21 ENCOUNTER — Ambulatory Visit: Payer: Medicare Other | Admitting: Orthopedic Surgery

## 2016-04-21 VITALS — BP 176/94 | Ht 71.0 in | Wt 269.0 lb

## 2016-04-21 DIAGNOSIS — Z4789 Encounter for other orthopedic aftercare: Secondary | ICD-10-CM

## 2016-04-21 DIAGNOSIS — E119 Type 2 diabetes mellitus without complications: Secondary | ICD-10-CM | POA: Diagnosis not present

## 2016-04-21 DIAGNOSIS — I10 Essential (primary) hypertension: Secondary | ICD-10-CM | POA: Diagnosis not present

## 2016-04-21 DIAGNOSIS — Z471 Aftercare following joint replacement surgery: Secondary | ICD-10-CM | POA: Diagnosis not present

## 2016-04-21 DIAGNOSIS — Z7984 Long term (current) use of oral hypoglycemic drugs: Secondary | ICD-10-CM | POA: Diagnosis not present

## 2016-04-21 DIAGNOSIS — Z96652 Presence of left artificial knee joint: Secondary | ICD-10-CM | POA: Diagnosis not present

## 2016-04-21 DIAGNOSIS — Z794 Long term (current) use of insulin: Secondary | ICD-10-CM | POA: Diagnosis not present

## 2016-04-21 MED ORDER — ACETAMINOPHEN-CODEINE #4 300-60 MG PO TABS: 1.0000 | ORAL_TABLET | ORAL | Status: DC | PRN

## 2016-04-21 NOTE — Progress Notes (Signed)
Patient ID: MIGUEL LIANG, male   DOB: 15-Nov-1940, 75 y.o.   MRN: [id number]  Chief Complaint  Patient presents with  . Wound Check    LEFT TKA, DOS 04/14/16    HPI JAELON NAIDOO is a 75 y.o. male.  7 days post left total knee complains of initially constipation of diarrhea and nausea with his hydrocodone 7.5 mg.  Pain is mild increases with therapy  No other complaints   Allergies  Allergen Reactions  . Penicillins Other (See Comments)    DIZZINESS Has patient had a PCN reaction causing immediate rash, facial/tongue/throat swelling, SOB or lightheadedness with hypotension: No Has patient had a PCN reaction causing severe rash involving mucus membranes or skin necrosis: No Has patient had a PCN reaction that required hospitalization No Has patient had a PCN reaction occurring within the last 10 years: No If all of the above answers are "NO", then may proceed with Cephalosporin use.     Current Outpatient Prescriptions  Medication Sig Dispense Refill  . amLODipine (NORVASC) 10 MG tablet Take 10 mg by mouth daily.    Marland Kitchen aspirin 325 MG tablet Take 1 tablet (325 mg total) by mouth daily. 28 tablet 0  . docusate sodium (COLACE) 100 MG capsule Take 1 capsule (100 mg total) by mouth 2 (two) times daily. 10 capsule 0  . glipiZIDE (GLUCOTROL XL) 5 MG 24 hr tablet Take 5 mg by mouth 2 (two) times daily.     . Glucosamine-Chondroit-Vit C-Mn (GLUCOSAMINE 1500 COMPLEX PO) Take 1 tablet by mouth 2 (two) times daily.     Marland Kitchen HYDROcodone-acetaminophen (NORCO) 7.5-325 MG tablet Take 1 tablet by mouth every 4 (four) hours. 30 tablet 0  . irbesartan (AVAPRO) 300 MG tablet Take 300 mg by mouth daily.    . metFORMIN (GLUCOPHAGE) 500 MG tablet Take 500 mg by mouth 2 (two) times daily with a meal.     . Misc Natural Products (GLUCOSAMINE CHONDROITIN MSM PO) Take 1 tablet by mouth 2 (two) times daily.     . Multiple Vitamin (MULTIVITAMIN) tablet Take 1 tablet by mouth daily.    Marland Kitchen  acetaminophen-codeine (TYLENOL #4) 300-60 MG tablet Take 1 tablet by mouth every 4 (four) hours as needed for moderate pain. 42 tablet 0   No current facility-administered medications for this visit.      Physical Exam Physical Exam Blood pressure 176/94, height 5\' 11"  (1.803 m), weight 269 lb (122.018 kg).  Appearance of incision: Incision is clean dry and intact we removed the staples  The calf was supple and the Homans sign was normal, there is minimal peripheral edema  Assessment and plan The patient is doing well and is in good condition  Follow-up will be 1 week to remove the staples  2:42 PM Arther Abbott, MD 04/21/2016

## 2016-04-23 DIAGNOSIS — Z7984 Long term (current) use of oral hypoglycemic drugs: Secondary | ICD-10-CM | POA: Diagnosis not present

## 2016-04-23 DIAGNOSIS — E119 Type 2 diabetes mellitus without complications: Secondary | ICD-10-CM | POA: Diagnosis not present

## 2016-04-23 DIAGNOSIS — Z471 Aftercare following joint replacement surgery: Secondary | ICD-10-CM | POA: Diagnosis not present

## 2016-04-23 DIAGNOSIS — Z794 Long term (current) use of insulin: Secondary | ICD-10-CM | POA: Diagnosis not present

## 2016-04-23 DIAGNOSIS — Z96652 Presence of left artificial knee joint: Secondary | ICD-10-CM | POA: Diagnosis not present

## 2016-04-23 DIAGNOSIS — I10 Essential (primary) hypertension: Secondary | ICD-10-CM | POA: Diagnosis not present

## 2016-04-26 DIAGNOSIS — Z794 Long term (current) use of insulin: Secondary | ICD-10-CM | POA: Diagnosis not present

## 2016-04-26 DIAGNOSIS — Z471 Aftercare following joint replacement surgery: Secondary | ICD-10-CM | POA: Diagnosis not present

## 2016-04-26 DIAGNOSIS — E119 Type 2 diabetes mellitus without complications: Secondary | ICD-10-CM | POA: Diagnosis not present

## 2016-04-26 DIAGNOSIS — Z96652 Presence of left artificial knee joint: Secondary | ICD-10-CM | POA: Diagnosis not present

## 2016-04-26 DIAGNOSIS — I10 Essential (primary) hypertension: Secondary | ICD-10-CM | POA: Diagnosis not present

## 2016-04-26 DIAGNOSIS — Z7984 Long term (current) use of oral hypoglycemic drugs: Secondary | ICD-10-CM | POA: Diagnosis not present

## 2016-04-28 ENCOUNTER — Ambulatory Visit: Payer: Medicare Other | Admitting: Orthopedic Surgery

## 2016-04-28 ENCOUNTER — Encounter: Payer: Self-pay | Admitting: Orthopedic Surgery

## 2016-04-28 VITALS — BP 168/95 | HR 79 | Ht 71.5 in | Wt 272.0 lb

## 2016-04-28 DIAGNOSIS — I10 Essential (primary) hypertension: Secondary | ICD-10-CM | POA: Diagnosis not present

## 2016-04-28 DIAGNOSIS — Z794 Long term (current) use of insulin: Secondary | ICD-10-CM | POA: Diagnosis not present

## 2016-04-28 DIAGNOSIS — Z471 Aftercare following joint replacement surgery: Secondary | ICD-10-CM | POA: Diagnosis not present

## 2016-04-28 DIAGNOSIS — Z7984 Long term (current) use of oral hypoglycemic drugs: Secondary | ICD-10-CM | POA: Diagnosis not present

## 2016-04-28 DIAGNOSIS — K5909 Other constipation: Secondary | ICD-10-CM

## 2016-04-28 DIAGNOSIS — Z96652 Presence of left artificial knee joint: Secondary | ICD-10-CM | POA: Diagnosis not present

## 2016-04-28 DIAGNOSIS — E119 Type 2 diabetes mellitus without complications: Secondary | ICD-10-CM | POA: Diagnosis not present

## 2016-04-28 DIAGNOSIS — Z4789 Encounter for other orthopedic aftercare: Secondary | ICD-10-CM

## 2016-04-28 MED ORDER — HYDROCODONE-ACETAMINOPHEN 5-325 MG PO TABS: 1.0000 | ORAL_TABLET | ORAL | Status: DC | PRN

## 2016-04-28 MED ORDER — MAGNESIUM CITRATE PO SOLN
0.5000 | Freq: Once | ORAL | Status: DC
Start: 2016-04-28 — End: 2016-04-28

## 2016-04-28 MED ORDER — IBUPROFEN 600 MG PO TABS: 600.0000 mg | ORAL_TABLET | Freq: Four times a day (QID) | ORAL | Status: DC | PRN

## 2016-04-28 MED ORDER — PROMETHAZINE HCL 12.5 MG PO TABS: 12.5000 mg | ORAL_TABLET | Freq: Four times a day (QID) | ORAL | Status: DC | PRN

## 2016-04-28 MED ORDER — MAGNESIUM CITRATE PO SOLN: 0.5000 | Freq: Once | ORAL | Status: DC

## 2016-04-28 NOTE — Patient Instructions (Addendum)
   Stop aspirin   Start ibuprofen  norco 5 mg  Call therapy dept to make appt  Take phenergan as needed   Take 1/2 bottle og mag citrate for constipation

## 2016-04-28 NOTE — Progress Notes (Signed)
Chief Complaint  Patient presents with  . Routine Post Op    Left TKA DOS:04/14/16   FOLLOW UP FOR  Left TKA   Primary complaint today is constipation. The patient was on Norco 7.5 and was switched to Tylenol No. 4 because of nausea. He has constipation although the nausea is gone.  He is also on aspirin and has a lot of ecchymosis in his left leg with a history of platelet related aspirin reaction  I discussed this with him again he would not like to go on Coumadin so we'll keep his TED hose on his very ambulatory at this point. He understands his risks.  Removed his staples his incision line looks good there was some mild staple erythema at different areas but no sign of infection is knee flexion is well past 90  So at this point with an appointment him on Norco 5 mg, he says he's taken Advil with no problem in the past so we'll put him on 600 mg of that we'll give him some Phenergan for nausea  He can start physical therapy as an outpatient  He'll come back after he comes back from his vacation or trip in 3 weeks

## 2016-04-29 ENCOUNTER — Telehealth: Payer: Self-pay | Admitting: *Deleted

## 2016-04-29 NOTE — Telephone Encounter (Signed)
Patient called and states he would rather take Aleve instead of the ibuprofen. Is this ok?

## 2016-04-29 NOTE — Telephone Encounter (Signed)
Patient informed. 

## 2016-04-29 NOTE — Telephone Encounter (Signed)
yes

## 2016-04-30 DIAGNOSIS — Z96652 Presence of left artificial knee joint: Secondary | ICD-10-CM | POA: Diagnosis not present

## 2016-04-30 DIAGNOSIS — Z7984 Long term (current) use of oral hypoglycemic drugs: Secondary | ICD-10-CM | POA: Diagnosis not present

## 2016-04-30 DIAGNOSIS — Z471 Aftercare following joint replacement surgery: Secondary | ICD-10-CM | POA: Diagnosis not present

## 2016-04-30 DIAGNOSIS — Z794 Long term (current) use of insulin: Secondary | ICD-10-CM | POA: Diagnosis not present

## 2016-04-30 DIAGNOSIS — I10 Essential (primary) hypertension: Secondary | ICD-10-CM | POA: Diagnosis not present

## 2016-04-30 DIAGNOSIS — E119 Type 2 diabetes mellitus without complications: Secondary | ICD-10-CM | POA: Diagnosis not present

## 2016-05-04 ENCOUNTER — Telehealth: Payer: Self-pay | Admitting: Orthopedic Surgery

## 2016-05-04 ENCOUNTER — Other Ambulatory Visit: Payer: Self-pay | Admitting: *Deleted

## 2016-05-04 MED ORDER — DOCUSATE SODIUM 100 MG PO CAPS: 100.0000 mg | ORAL_CAPSULE | Freq: Two times a day (BID) | ORAL | Status: DC

## 2016-05-04 NOTE — Telephone Encounter (Signed)
Patient called to request refill of a medication prescribed while in-patient at [facility] - stool softener: Docqulace 100mg  capsule, which he states Dr Aline Brochure advised that he may take twice a day if one time per day not working. He will contact [facility] to fax refill request.  Please advise. Ph#[phone] (Home) [phone] (Cell)

## 2016-05-04 NOTE — Telephone Encounter (Signed)
Left message for patient to return call.

## 2016-05-04 NOTE — Telephone Encounter (Signed)
Brian [name] called and stated that he found out that he could buy stool softener rather cheaply over the counter and will do so today.  Also, he wanted to know if and when he could go swimming.  I told him that I would have to ask the nurse.  Please advise

## 2016-05-04 NOTE — Telephone Encounter (Signed)
REFILL SENT AS REQUESTED ?

## 2016-05-05 DIAGNOSIS — M25562 Pain in left knee: Secondary | ICD-10-CM | POA: Diagnosis not present

## 2016-05-05 DIAGNOSIS — R262 Difficulty in walking, not elsewhere classified: Secondary | ICD-10-CM | POA: Diagnosis not present

## 2016-05-05 DIAGNOSIS — Z96652 Presence of left artificial knee joint: Secondary | ICD-10-CM | POA: Diagnosis not present

## 2016-05-05 DIAGNOSIS — M79652 Pain in left thigh: Secondary | ICD-10-CM | POA: Diagnosis not present

## 2016-05-07 DIAGNOSIS — M79652 Pain in left thigh: Secondary | ICD-10-CM | POA: Diagnosis not present

## 2016-05-07 DIAGNOSIS — M25562 Pain in left knee: Secondary | ICD-10-CM | POA: Diagnosis not present

## 2016-05-07 DIAGNOSIS — R262 Difficulty in walking, not elsewhere classified: Secondary | ICD-10-CM | POA: Diagnosis not present

## 2016-05-07 DIAGNOSIS — Z96652 Presence of left artificial knee joint: Secondary | ICD-10-CM | POA: Diagnosis not present

## 2016-05-10 ENCOUNTER — Telehealth: Payer: Self-pay | Admitting: Orthopedic Surgery

## 2016-05-10 DIAGNOSIS — M79652 Pain in left thigh: Secondary | ICD-10-CM | POA: Diagnosis not present

## 2016-05-10 DIAGNOSIS — M25562 Pain in left knee: Secondary | ICD-10-CM | POA: Diagnosis not present

## 2016-05-10 DIAGNOSIS — R262 Difficulty in walking, not elsewhere classified: Secondary | ICD-10-CM | POA: Diagnosis not present

## 2016-05-10 DIAGNOSIS — Z96652 Presence of left artificial knee joint: Secondary | ICD-10-CM | POA: Diagnosis not present

## 2016-05-10 NOTE — Telephone Encounter (Signed)
TED hose 4 weeks postop  POOL 8 weeks postop

## 2016-05-10 NOTE — Telephone Encounter (Signed)
ROUTING TO DR HARRISON TO ADVISE  DOS 04/14/16

## 2016-05-10 NOTE — Telephone Encounter (Signed)
PATIENT AWARE

## 2016-05-10 NOTE — Telephone Encounter (Signed)
Patient called to inquire about when he can discontinue wearing the Ted hose, and also, per recent note 05/04/16, asking when may he go swimming in pool? His left total knee replacement surgery was done 04/14/16.  Please call back at Ph#'s [phone] [facility]) [phone] [facility])

## 2016-05-19 ENCOUNTER — Ambulatory Visit: Payer: Medicare Other | Admitting: Orthopedic Surgery

## 2016-05-20 ENCOUNTER — Ambulatory Visit: Payer: Medicare Other | Admitting: Orthopedic Surgery

## 2016-05-20 DIAGNOSIS — R262 Difficulty in walking, not elsewhere classified: Secondary | ICD-10-CM | POA: Diagnosis not present

## 2016-05-20 DIAGNOSIS — Z96652 Presence of left artificial knee joint: Secondary | ICD-10-CM | POA: Diagnosis not present

## 2016-05-20 DIAGNOSIS — M79652 Pain in left thigh: Secondary | ICD-10-CM | POA: Diagnosis not present

## 2016-05-20 DIAGNOSIS — M25562 Pain in left knee: Secondary | ICD-10-CM | POA: Diagnosis not present

## 2016-05-24 ENCOUNTER — Encounter: Payer: Self-pay | Admitting: Orthopedic Surgery

## 2016-05-24 ENCOUNTER — Ambulatory Visit: Payer: Medicare Other | Admitting: Orthopedic Surgery

## 2016-05-24 VITALS — BP 180/95 | Ht 71.5 in | Wt 265.0 lb

## 2016-05-24 DIAGNOSIS — M25562 Pain in left knee: Secondary | ICD-10-CM

## 2016-05-24 DIAGNOSIS — R262 Difficulty in walking, not elsewhere classified: Secondary | ICD-10-CM | POA: Diagnosis not present

## 2016-05-24 DIAGNOSIS — Z96652 Presence of left artificial knee joint: Secondary | ICD-10-CM | POA: Diagnosis not present

## 2016-05-24 DIAGNOSIS — M79652 Pain in left thigh: Secondary | ICD-10-CM | POA: Diagnosis not present

## 2016-05-24 MED ORDER — OXYCODONE-ACETAMINOPHEN 5-325 MG PO TABS: 1.0000 | ORAL_TABLET | Freq: Three times a day (TID) | ORAL | 0 refills | Status: DC | PRN

## 2016-05-24 NOTE — Progress Notes (Signed)
This is a postop visit  6 weeks postop status post left total knee  Patient's doing well with physical therapy but he's having a lot of pain. We reduced his hydrocodone to 5 mg he says it doesn't help. He does get some relief from ibuprofen. Most of his pain is at night.  He currently has 120 of knee flexion is a 5 knee flexion contracture he has no erythema around the knee no excessive warmth other than expected no peripheral edema no signs of infection  He's walking normally without assistive devices  He discussed his options for pain medicine. Currently he gets constipation severe from codeine and hydrocodone and the decreased dosing did not help. We offered him a choice of Dilaudid tramadol or Percocet and I chose Percocet 5 mg to take at night for pain. He can divide his ibuprofen up to no more than 3200 mg per day  Follow-up in 4 weeks

## 2016-05-24 NOTE — Patient Instructions (Signed)
Take percocet at night and 400-800 mg Ibuprofen every 8 hours as needed

## 2016-05-31 ENCOUNTER — Telehealth: Payer: Self-pay | Admitting: Orthopedic Surgery

## 2016-05-31 NOTE — Telephone Encounter (Signed)
ROUTING TO DR HARRISON TO ADVISE 

## 2016-05-31 NOTE — Telephone Encounter (Signed)
Brian [name] called to say that he has a blood blister on the toe next to his little toe on his left foot. He was wondering if that is something he should be worried about or what. He is in [place] and will wait to hear from our office.[phone]  Please advise

## 2016-05-31 NOTE — Telephone Encounter (Signed)
Yes it is   Keep it clean dry and if it opens go to urgent care and use triple antibiotic cream

## 2016-05-31 NOTE — Telephone Encounter (Signed)
Patient aware.

## 2016-06-02 DIAGNOSIS — R262 Difficulty in walking, not elsewhere classified: Secondary | ICD-10-CM | POA: Diagnosis not present

## 2016-06-02 DIAGNOSIS — M25562 Pain in left knee: Secondary | ICD-10-CM | POA: Diagnosis not present

## 2016-06-02 DIAGNOSIS — Z96652 Presence of left artificial knee joint: Secondary | ICD-10-CM | POA: Diagnosis not present

## 2016-06-02 DIAGNOSIS — M79652 Pain in left thigh: Secondary | ICD-10-CM | POA: Diagnosis not present

## 2016-06-04 DIAGNOSIS — M79652 Pain in left thigh: Secondary | ICD-10-CM | POA: Diagnosis not present

## 2016-06-04 DIAGNOSIS — R262 Difficulty in walking, not elsewhere classified: Secondary | ICD-10-CM | POA: Diagnosis not present

## 2016-06-04 DIAGNOSIS — M25562 Pain in left knee: Secondary | ICD-10-CM | POA: Diagnosis not present

## 2016-06-04 DIAGNOSIS — Z96652 Presence of left artificial knee joint: Secondary | ICD-10-CM | POA: Diagnosis not present

## 2016-06-08 DIAGNOSIS — M79652 Pain in left thigh: Secondary | ICD-10-CM | POA: Diagnosis not present

## 2016-06-08 DIAGNOSIS — Z96652 Presence of left artificial knee joint: Secondary | ICD-10-CM | POA: Diagnosis not present

## 2016-06-08 DIAGNOSIS — M25562 Pain in left knee: Secondary | ICD-10-CM | POA: Diagnosis not present

## 2016-06-08 DIAGNOSIS — R262 Difficulty in walking, not elsewhere classified: Secondary | ICD-10-CM | POA: Diagnosis not present

## 2016-06-10 DIAGNOSIS — M79652 Pain in left thigh: Secondary | ICD-10-CM | POA: Diagnosis not present

## 2016-06-10 DIAGNOSIS — R262 Difficulty in walking, not elsewhere classified: Secondary | ICD-10-CM | POA: Diagnosis not present

## 2016-06-10 DIAGNOSIS — Z96652 Presence of left artificial knee joint: Secondary | ICD-10-CM | POA: Diagnosis not present

## 2016-06-10 DIAGNOSIS — M25562 Pain in left knee: Secondary | ICD-10-CM | POA: Diagnosis not present

## 2016-06-14 ENCOUNTER — Encounter: Payer: Self-pay | Admitting: Orthopedic Surgery

## 2016-06-14 ENCOUNTER — Telehealth: Payer: Self-pay | Admitting: Orthopedic Surgery

## 2016-06-14 NOTE — Telephone Encounter (Signed)
Patient called asking if our office had sent a prescription to [facility] for Meloxicam? He said that Dr. Aline Brochure was suppose to send one but [facility] had not received it.  Please advise.

## 2016-06-15 ENCOUNTER — Other Ambulatory Visit: Payer: Self-pay | Admitting: *Deleted

## 2016-06-15 DIAGNOSIS — M79652 Pain in left thigh: Secondary | ICD-10-CM | POA: Diagnosis not present

## 2016-06-15 DIAGNOSIS — M25562 Pain in left knee: Secondary | ICD-10-CM | POA: Diagnosis not present

## 2016-06-15 DIAGNOSIS — Z96652 Presence of left artificial knee joint: Secondary | ICD-10-CM | POA: Diagnosis not present

## 2016-06-15 DIAGNOSIS — R262 Difficulty in walking, not elsewhere classified: Secondary | ICD-10-CM | POA: Diagnosis not present

## 2016-06-15 MED ORDER — MELOXICAM 15 MG PO TABS: 15.0000 mg | ORAL_TABLET | Freq: Every day | ORAL | 0 refills | Status: AC

## 2016-06-15 NOTE — Telephone Encounter (Signed)
ok 

## 2016-06-15 NOTE — Telephone Encounter (Signed)
Okay to refill? 

## 2016-06-15 NOTE — Telephone Encounter (Signed)
Medication sent as requested.

## 2016-06-21 ENCOUNTER — Encounter: Payer: Self-pay | Admitting: Orthopedic Surgery

## 2016-06-21 ENCOUNTER — Telehealth: Payer: Self-pay | Admitting: Orthopedic Surgery

## 2016-06-21 ENCOUNTER — Ambulatory Visit (INDEPENDENT_AMBULATORY_CARE_PROVIDER_SITE_OTHER): Payer: Medicare Other | Admitting: Orthopedic Surgery

## 2016-06-21 DIAGNOSIS — Z96652 Presence of left artificial knee joint: Secondary | ICD-10-CM

## 2016-06-21 DIAGNOSIS — M25562 Pain in left knee: Secondary | ICD-10-CM

## 2016-06-21 MED ORDER — OXYCODONE-ACETAMINOPHEN 5-325 MG PO TABS: 1.0000 | ORAL_TABLET | Freq: Three times a day (TID) | ORAL | 0 refills | Status: DC | PRN

## 2016-06-21 NOTE — Telephone Encounter (Signed)
Call received from patient - states intended to ask Dr Aline Brochure about both his feet staying cold, and also of numbness of feet.  Notify primary care?  Please advise.- ph#[phone] (Home) / [phone] [facility])

## 2016-06-21 NOTE — Telephone Encounter (Signed)
ROUTING PATIENT QUESTION TO DR Aline Brochure

## 2016-06-21 NOTE — Telephone Encounter (Signed)
I emailed [employer] again today asking her to clear this account of the xray charges.  I called Pt back and told him I emailed Pennie again regarding his account

## 2016-06-21 NOTE — Progress Notes (Signed)
Patient ID: Brian [name], male   DOB: 1941/07/13, 75 y.o.   MRN: [id number]  Post op visit   Chief Complaint  Patient presents with  . Follow-up    POST OP LEFT TKA 04/14/16    There were no vitals taken for this visit.  Patient doing well although he slipped and fell twice straining his knee but now says he is much better. Knee feels stable wound looks good flexion is 125    ASSESSMENT AND PLAN   We discussed his Percocet he uses one at night  Continue until 3 months or up and then we need to stop is aware follow-up in September

## 2016-06-22 ENCOUNTER — Telehealth: Payer: Self-pay | Admitting: Orthopedic Surgery

## 2016-06-22 NOTE — Telephone Encounter (Signed)
Patient called asking if Dr. Aline Brochure had answered his question about his feet from yesterday.  I did tell him that you had routed it to Dr. Aline Brochure. Please advise.

## 2016-06-23 NOTE — Telephone Encounter (Signed)
Cold feet: These are usually vascular issues inflow or outflow  Numbness: usually neuropathies   Not orthopedic issues   Have primary care physician evaluate

## 2016-06-23 NOTE — Telephone Encounter (Signed)
Routing to Dr Harrison 

## 2016-06-23 NOTE — Telephone Encounter (Signed)
I called patient and explained that he should be evaluated by his PCP, per Dr. Aline Brochure. He said that he already has an appointment scheduled.

## 2016-06-24 DIAGNOSIS — G629 Polyneuropathy, unspecified: Secondary | ICD-10-CM | POA: Diagnosis not present

## 2016-06-24 DIAGNOSIS — E1165 Type 2 diabetes mellitus with hyperglycemia: Secondary | ICD-10-CM | POA: Diagnosis not present

## 2016-06-24 DIAGNOSIS — Z96651 Presence of right artificial knee joint: Secondary | ICD-10-CM | POA: Diagnosis not present

## 2016-07-19 ENCOUNTER — Ambulatory Visit: Payer: Medicare Other | Admitting: Orthopedic Surgery

## 2016-07-23 DIAGNOSIS — I10 Essential (primary) hypertension: Secondary | ICD-10-CM | POA: Diagnosis not present

## 2016-07-23 DIAGNOSIS — E1165 Type 2 diabetes mellitus with hyperglycemia: Secondary | ICD-10-CM | POA: Diagnosis not present

## 2016-07-23 DIAGNOSIS — R7301 Impaired fasting glucose: Secondary | ICD-10-CM | POA: Diagnosis not present

## 2016-07-23 DIAGNOSIS — E039 Hypothyroidism, unspecified: Secondary | ICD-10-CM | POA: Diagnosis not present

## 2016-07-23 DIAGNOSIS — E785 Hyperlipidemia, unspecified: Secondary | ICD-10-CM | POA: Diagnosis not present

## 2016-07-23 DIAGNOSIS — N529 Male erectile dysfunction, unspecified: Secondary | ICD-10-CM | POA: Diagnosis not present

## 2016-07-23 DIAGNOSIS — E119 Type 2 diabetes mellitus without complications: Secondary | ICD-10-CM | POA: Diagnosis not present

## 2016-07-23 DIAGNOSIS — E782 Mixed hyperlipidemia: Secondary | ICD-10-CM | POA: Diagnosis not present

## 2016-07-26 ENCOUNTER — Encounter: Payer: Self-pay | Admitting: Orthopedic Surgery

## 2016-07-26 ENCOUNTER — Telehealth: Payer: Self-pay | Admitting: Orthopedic Surgery

## 2016-07-26 ENCOUNTER — Ambulatory Visit (INDEPENDENT_AMBULATORY_CARE_PROVIDER_SITE_OTHER): Payer: Medicare Other | Admitting: Orthopedic Surgery

## 2016-07-26 VITALS — BP 169/92 | HR 77 | Wt 263.0 lb

## 2016-07-26 DIAGNOSIS — Z96652 Presence of left artificial knee joint: Secondary | ICD-10-CM | POA: Diagnosis not present

## 2016-07-26 NOTE — Telephone Encounter (Signed)
Mr. Torrez called to relay he has a bill for $110 for DOS 02/23/16. He says that Medicare will not pay this because it was coded as a routine xray. He said that he was told that if it was coded as pre-surgery xray they would pay. His surgery was on 03/14/16. He was quite upset about this, saying that he has been trying to get this resolved for about 4 months now.

## 2016-07-26 NOTE — Progress Notes (Signed)
Patient ID: KRISH COE, male   DOB: 04/21/1941, 75 y.o.   MRN: [id number]  Post op visit   Chief Complaint  Patient presents with  . Follow-up    left knee TKA, 04/14/16    BP (!) 169/92   Pulse 77   Wt 263 lb (119.3 kg)   BMI 36.17 kg/m   3 months plus status post left total knee. Patient is now on 05-1199 mg of ibuprofen daily has not taken a Percocet in about 2 weeks. He did fall in his yard both feet gave out from under him he slipped and landed on his buttocks but that was over 2 weeks ago  [facility] [employer] [facility] pretty much normal activity working on strengthening his left quadriceps muscle  He has no fever chills giving way or instability in the knee  Knee flexion is 125 extension is about 3.  Recommend follow-up in December for 6 month follow-up

## 2016-07-27 DIAGNOSIS — E782 Mixed hyperlipidemia: Secondary | ICD-10-CM | POA: Diagnosis not present

## 2016-07-27 DIAGNOSIS — D696 Thrombocytopenia, unspecified: Secondary | ICD-10-CM | POA: Diagnosis not present

## 2016-07-27 DIAGNOSIS — G629 Polyneuropathy, unspecified: Secondary | ICD-10-CM | POA: Diagnosis not present

## 2016-07-27 DIAGNOSIS — Z0000 Encounter for general adult medical examination without abnormal findings: Secondary | ICD-10-CM | POA: Diagnosis not present

## 2016-07-27 DIAGNOSIS — Z23 Encounter for immunization: Secondary | ICD-10-CM | POA: Diagnosis not present

## 2016-07-27 DIAGNOSIS — E1165 Type 2 diabetes mellitus with hyperglycemia: Secondary | ICD-10-CM | POA: Diagnosis not present

## 2016-07-27 DIAGNOSIS — I10 Essential (primary) hypertension: Secondary | ICD-10-CM | POA: Diagnosis not present

## 2016-07-27 NOTE — Telephone Encounter (Signed)
I called pt regarding these charges and also sent a email to [facility]. 07/27/16

## 2016-08-23 DIAGNOSIS — G629 Polyneuropathy, unspecified: Secondary | ICD-10-CM | POA: Diagnosis not present

## 2016-08-23 DIAGNOSIS — N401 Enlarged prostate with lower urinary tract symptoms: Secondary | ICD-10-CM | POA: Diagnosis not present

## 2016-08-23 DIAGNOSIS — E1165 Type 2 diabetes mellitus with hyperglycemia: Secondary | ICD-10-CM | POA: Diagnosis not present

## 2016-09-13 DIAGNOSIS — I10 Essential (primary) hypertension: Secondary | ICD-10-CM | POA: Diagnosis not present

## 2016-09-13 DIAGNOSIS — E1165 Type 2 diabetes mellitus with hyperglycemia: Secondary | ICD-10-CM | POA: Diagnosis not present

## 2016-09-13 DIAGNOSIS — G629 Polyneuropathy, unspecified: Secondary | ICD-10-CM | POA: Diagnosis not present

## 2016-10-04 ENCOUNTER — Ambulatory Visit (INDEPENDENT_AMBULATORY_CARE_PROVIDER_SITE_OTHER): Payer: Medicare Other | Admitting: Orthopedic Surgery

## 2016-10-04 ENCOUNTER — Encounter: Payer: Self-pay | Admitting: Orthopedic Surgery

## 2016-10-04 DIAGNOSIS — Z96652 Presence of left artificial knee joint: Secondary | ICD-10-CM

## 2016-10-04 NOTE — Progress Notes (Signed)
Patient ID: Brian [name], male   DOB: 09-Nov-1940, 75 y.o.   MRN: [id number]  Chief Complaint  Patient presents with  . Follow-up    Lt TKA, DOS 04/14/16    HPI Brian [name] is a 75 y.o. male.   HPI  Status post left total knee back in June doing well with very few complaints. He does say that he has proximal weakness in his right and left leg but is doing exercises to help himself get out of a chair better and get off the commode better  He has some numbness along the lateral incision which is normal  Review of Systems Review of Systems  Denies numbness or tingling in his legs proximal to distal but does have some diabetic neuropathy   Physical Exam  Physical Exam  his knee incision looks clean dry and intact he has no swelling or tenderness in the knee is range of motion is about 3-125 degrees with normal stability in the medial lateral and sagittal plane neurovascular exam shows normal sensation around the knee incision except for the lateral portion and distally has some mild peripheral vascular disease related to venous stasis. Inflow normal.  Gait normal  Encounter Diagnosis  Name Primary?  . Status post total left knee replacement Yes    Six-month follow-up for x-ray annual visit #1

## 2016-11-19 ENCOUNTER — Ambulatory Visit (INDEPENDENT_AMBULATORY_CARE_PROVIDER_SITE_OTHER): Payer: Medicare Other | Admitting: Urology

## 2016-11-19 DIAGNOSIS — R350 Frequency of micturition: Secondary | ICD-10-CM

## 2016-11-19 DIAGNOSIS — N401 Enlarged prostate with lower urinary tract symptoms: Secondary | ICD-10-CM

## 2016-11-19 DIAGNOSIS — N3941 Urge incontinence: Secondary | ICD-10-CM | POA: Diagnosis not present

## 2016-11-30 DIAGNOSIS — E119 Type 2 diabetes mellitus without complications: Secondary | ICD-10-CM | POA: Diagnosis not present

## 2016-11-30 DIAGNOSIS — E1165 Type 2 diabetes mellitus with hyperglycemia: Secondary | ICD-10-CM | POA: Diagnosis not present

## 2016-11-30 DIAGNOSIS — R7301 Impaired fasting glucose: Secondary | ICD-10-CM | POA: Diagnosis not present

## 2016-11-30 DIAGNOSIS — N529 Male erectile dysfunction, unspecified: Secondary | ICD-10-CM | POA: Diagnosis not present

## 2016-11-30 DIAGNOSIS — E039 Hypothyroidism, unspecified: Secondary | ICD-10-CM | POA: Diagnosis not present

## 2016-11-30 DIAGNOSIS — E785 Hyperlipidemia, unspecified: Secondary | ICD-10-CM | POA: Diagnosis not present

## 2016-11-30 DIAGNOSIS — E782 Mixed hyperlipidemia: Secondary | ICD-10-CM | POA: Diagnosis not present

## 2016-11-30 DIAGNOSIS — I10 Essential (primary) hypertension: Secondary | ICD-10-CM | POA: Diagnosis not present

## 2016-12-06 DIAGNOSIS — D696 Thrombocytopenia, unspecified: Secondary | ICD-10-CM | POA: Diagnosis not present

## 2016-12-06 DIAGNOSIS — N529 Male erectile dysfunction, unspecified: Secondary | ICD-10-CM | POA: Diagnosis not present

## 2016-12-06 DIAGNOSIS — I10 Essential (primary) hypertension: Secondary | ICD-10-CM | POA: Diagnosis not present

## 2016-12-06 DIAGNOSIS — E1165 Type 2 diabetes mellitus with hyperglycemia: Secondary | ICD-10-CM | POA: Diagnosis not present

## 2017-01-18 ENCOUNTER — Encounter: Payer: Self-pay | Admitting: Orthopedic Surgery

## 2017-01-18 ENCOUNTER — Telehealth: Payer: Self-pay | Admitting: Orthopedic Surgery

## 2017-01-18 NOTE — Telephone Encounter (Signed)
Brian [name] from [facility] called asking if this patient needed medication for dental procedures. I asked Dr. Aline Brochure just now and he said yes, and for me to send you this message.  Brian [name] said a paper on letterhead with the answer yes or no would be fine. She will scan it into his file so they would not have to bother [place] each time. Fax: [phone]

## 2017-01-18 NOTE — Telephone Encounter (Signed)
OKAY PLEASE PROVIDE

## 2017-02-04 ENCOUNTER — Ambulatory Visit (INDEPENDENT_AMBULATORY_CARE_PROVIDER_SITE_OTHER): Payer: Medicare Other | Admitting: Urology

## 2017-02-04 DIAGNOSIS — N401 Enlarged prostate with lower urinary tract symptoms: Secondary | ICD-10-CM

## 2017-02-04 DIAGNOSIS — R350 Frequency of micturition: Secondary | ICD-10-CM | POA: Diagnosis not present

## 2017-02-04 DIAGNOSIS — N3941 Urge incontinence: Secondary | ICD-10-CM

## 2017-02-09 IMAGING — DX DG FINGER LITTLE 2+V*L*
3 series · 3 of 3 positions shown · non-contrast
Comparison: 12/25/2015

CLINICAL DATA: 74-year-old male for follow-up of little ring finger
PIP fracture dislocation.

EXAM:
LEFT LITTLE FINGER 2+V

[finger ap]
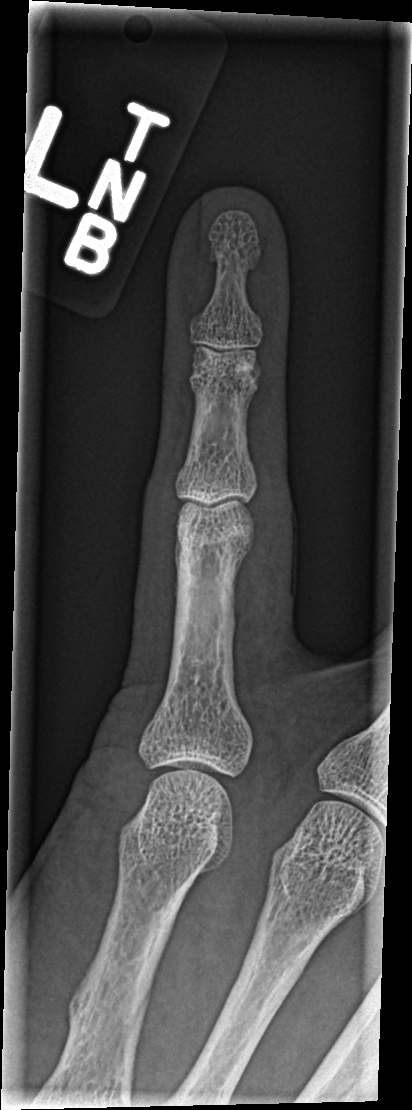

[finger obl]
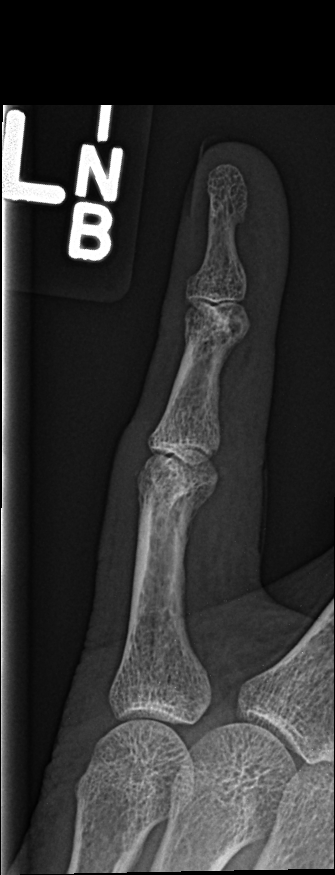

[finger lat]
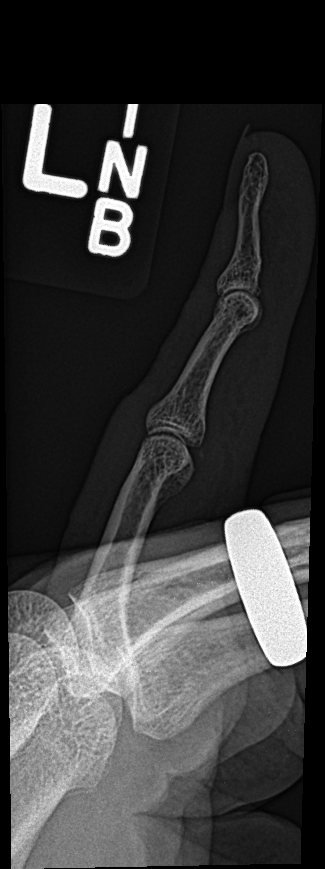

[3 of 3 positions shown; findings below may reference images not displayed]

FINDINGS: A tiny fracture fragment along the palmar aspect of the PIP joint
noted.

There is no evidence of subluxation or dislocation.

No other bony abnormalities are noted.
IMPRESSION: Tiny fracture fragment along the palmar aspect of the PIP joint.

No subluxation or dislocation.

## 2017-03-30 DIAGNOSIS — L03114 Cellulitis of left upper limb: Secondary | ICD-10-CM | POA: Diagnosis not present

## 2017-03-30 DIAGNOSIS — S40862A Insect bite (nonvenomous) of left upper arm, initial encounter: Secondary | ICD-10-CM | POA: Diagnosis not present

## 2017-03-30 DIAGNOSIS — Z6836 Body mass index (BMI) 36.0-36.9, adult: Secondary | ICD-10-CM | POA: Diagnosis not present

## 2017-04-04 ENCOUNTER — Ambulatory Visit (INDEPENDENT_AMBULATORY_CARE_PROVIDER_SITE_OTHER): Payer: Medicare Other

## 2017-04-04 ENCOUNTER — Encounter: Payer: Self-pay | Admitting: Orthopedic Surgery

## 2017-04-04 ENCOUNTER — Ambulatory Visit (INDEPENDENT_AMBULATORY_CARE_PROVIDER_SITE_OTHER): Payer: Medicare Other | Admitting: Orthopedic Surgery

## 2017-04-04 DIAGNOSIS — Z96652 Presence of left artificial knee joint: Secondary | ICD-10-CM

## 2017-04-04 NOTE — Progress Notes (Signed)
ANNUAL FOLLOW UP FOR  left TKA   Chief Complaint  Patient presents with  . Follow-up    annual xray LT TKA, DOS 04/14/16     HPI: The patient is here for the annual  follow-up x-ray for knee replacement  76 year old male 1 year after his knee replacement he says he was doing some exercises with the machines at the [facility] and then going into the pool to try to regain some strength in his legs as he noticed that his legs were getting weak and he was having numbness after exercise. He stopped doing the machine exercises and concentrated on the pool and some of the numbness seemed to go away. He also reports some intermittent lower back pain     Review of Systems  Musculoskeletal:       The patient reports weakness in his proximal thigh muscles and difficulty getting out of a chair and exercise-related numbness in his left knee.   Examination of the Left KNEE   Inspection shows : incision healed nicely without erythema, no tenderness no swelling  Range of motion total range of motion is 125  Stability the knee is stable anterior to posterior as well as medial to lateral  Strength quadriceps strength is normal  Skin no erythema around the skin incision  Cardiovascular NO EDEMA    Medical decision-making section  X-rays ordered with the following personal interpretation  Normal alignment without loosening   Diagnosis  Encounter Diagnosis  Name Primary?  . History of left knee replacement Yes   He may have some early signs of spinal stenosis we will follow that on the symptomatic basis. He will alter his exercise program and monitor himself for any persistent leg numbness. He was told to call us back for an appointment sooner than 1 year if he gets constant pain numbness weakness in his left leg or right leg  Plan follow-up 1 year repeat x-rays

## 2017-04-19 DIAGNOSIS — R070 Pain in throat: Secondary | ICD-10-CM | POA: Diagnosis not present

## 2017-05-11 DIAGNOSIS — Z6837 Body mass index (BMI) 37.0-37.9, adult: Secondary | ICD-10-CM | POA: Diagnosis not present

## 2017-05-11 DIAGNOSIS — S91302D Unspecified open wound, left foot, subsequent encounter: Secondary | ICD-10-CM | POA: Diagnosis not present

## 2017-06-02 DIAGNOSIS — I10 Essential (primary) hypertension: Secondary | ICD-10-CM | POA: Diagnosis not present

## 2017-06-02 DIAGNOSIS — Z872 Personal history of diseases of the skin and subcutaneous tissue: Secondary | ICD-10-CM | POA: Diagnosis not present

## 2017-06-29 DIAGNOSIS — N401 Enlarged prostate with lower urinary tract symptoms: Secondary | ICD-10-CM | POA: Diagnosis not present

## 2017-06-29 DIAGNOSIS — E782 Mixed hyperlipidemia: Secondary | ICD-10-CM | POA: Diagnosis not present

## 2017-06-29 DIAGNOSIS — I10 Essential (primary) hypertension: Secondary | ICD-10-CM | POA: Diagnosis not present

## 2017-06-29 DIAGNOSIS — E1165 Type 2 diabetes mellitus with hyperglycemia: Secondary | ICD-10-CM | POA: Diagnosis not present

## 2017-07-01 DIAGNOSIS — D696 Thrombocytopenia, unspecified: Secondary | ICD-10-CM | POA: Diagnosis not present

## 2017-07-01 DIAGNOSIS — G629 Polyneuropathy, unspecified: Secondary | ICD-10-CM | POA: Diagnosis not present

## 2017-07-01 DIAGNOSIS — E782 Mixed hyperlipidemia: Secondary | ICD-10-CM | POA: Diagnosis not present

## 2017-07-01 DIAGNOSIS — I10 Essential (primary) hypertension: Secondary | ICD-10-CM | POA: Diagnosis not present

## 2017-07-01 DIAGNOSIS — Z0000 Encounter for general adult medical examination without abnormal findings: Secondary | ICD-10-CM | POA: Diagnosis not present

## 2017-07-01 DIAGNOSIS — Z6839 Body mass index (BMI) 39.0-39.9, adult: Secondary | ICD-10-CM | POA: Diagnosis not present

## 2017-07-01 DIAGNOSIS — E1165 Type 2 diabetes mellitus with hyperglycemia: Secondary | ICD-10-CM | POA: Diagnosis not present

## 2017-07-01 DIAGNOSIS — N529 Male erectile dysfunction, unspecified: Secondary | ICD-10-CM | POA: Diagnosis not present

## 2017-07-01 DIAGNOSIS — Z23 Encounter for immunization: Secondary | ICD-10-CM | POA: Diagnosis not present

## 2017-07-19 DIAGNOSIS — H9201 Otalgia, right ear: Secondary | ICD-10-CM | POA: Insufficient documentation

## 2017-07-19 DIAGNOSIS — H6123 Impacted cerumen, bilateral: Secondary | ICD-10-CM | POA: Insufficient documentation

## 2017-07-19 DIAGNOSIS — M542 Cervicalgia: Secondary | ICD-10-CM | POA: Insufficient documentation

## 2017-07-19 DIAGNOSIS — H6121 Impacted cerumen, right ear: Secondary | ICD-10-CM | POA: Diagnosis not present

## 2017-08-12 DIAGNOSIS — R070 Pain in throat: Secondary | ICD-10-CM | POA: Diagnosis not present

## 2017-08-12 DIAGNOSIS — I10 Essential (primary) hypertension: Secondary | ICD-10-CM | POA: Diagnosis not present

## 2017-11-11 DIAGNOSIS — E782 Mixed hyperlipidemia: Secondary | ICD-10-CM | POA: Diagnosis not present

## 2017-11-11 DIAGNOSIS — Z96651 Presence of right artificial knee joint: Secondary | ICD-10-CM | POA: Diagnosis not present

## 2017-11-11 DIAGNOSIS — I10 Essential (primary) hypertension: Secondary | ICD-10-CM | POA: Diagnosis not present

## 2017-11-11 DIAGNOSIS — D696 Thrombocytopenia, unspecified: Secondary | ICD-10-CM | POA: Diagnosis not present

## 2017-11-11 DIAGNOSIS — N401 Enlarged prostate with lower urinary tract symptoms: Secondary | ICD-10-CM | POA: Diagnosis not present

## 2017-11-11 DIAGNOSIS — G629 Polyneuropathy, unspecified: Secondary | ICD-10-CM | POA: Diagnosis not present

## 2017-11-11 DIAGNOSIS — N529 Male erectile dysfunction, unspecified: Secondary | ICD-10-CM | POA: Diagnosis not present

## 2017-11-11 DIAGNOSIS — E1165 Type 2 diabetes mellitus with hyperglycemia: Secondary | ICD-10-CM | POA: Diagnosis not present

## 2017-11-15 DIAGNOSIS — G629 Polyneuropathy, unspecified: Secondary | ICD-10-CM | POA: Diagnosis not present

## 2017-11-15 DIAGNOSIS — D696 Thrombocytopenia, unspecified: Secondary | ICD-10-CM | POA: Diagnosis not present

## 2017-11-15 DIAGNOSIS — I10 Essential (primary) hypertension: Secondary | ICD-10-CM | POA: Diagnosis not present

## 2017-11-15 DIAGNOSIS — E782 Mixed hyperlipidemia: Secondary | ICD-10-CM | POA: Diagnosis not present

## 2017-11-15 DIAGNOSIS — N529 Male erectile dysfunction, unspecified: Secondary | ICD-10-CM | POA: Diagnosis not present

## 2017-11-15 DIAGNOSIS — E119 Type 2 diabetes mellitus without complications: Secondary | ICD-10-CM | POA: Diagnosis not present

## 2017-12-21 DIAGNOSIS — E782 Mixed hyperlipidemia: Secondary | ICD-10-CM | POA: Diagnosis not present

## 2017-12-21 DIAGNOSIS — L918 Other hypertrophic disorders of the skin: Secondary | ICD-10-CM | POA: Diagnosis not present

## 2017-12-21 DIAGNOSIS — Z96651 Presence of right artificial knee joint: Secondary | ICD-10-CM | POA: Diagnosis not present

## 2017-12-21 DIAGNOSIS — E119 Type 2 diabetes mellitus without complications: Secondary | ICD-10-CM | POA: Diagnosis not present

## 2017-12-21 DIAGNOSIS — D485 Neoplasm of uncertain behavior of skin: Secondary | ICD-10-CM | POA: Diagnosis not present

## 2017-12-21 DIAGNOSIS — G629 Polyneuropathy, unspecified: Secondary | ICD-10-CM | POA: Diagnosis not present

## 2017-12-21 DIAGNOSIS — N529 Male erectile dysfunction, unspecified: Secondary | ICD-10-CM | POA: Diagnosis not present

## 2017-12-21 DIAGNOSIS — D696 Thrombocytopenia, unspecified: Secondary | ICD-10-CM | POA: Diagnosis not present

## 2017-12-21 DIAGNOSIS — E1165 Type 2 diabetes mellitus with hyperglycemia: Secondary | ICD-10-CM | POA: Diagnosis not present

## 2017-12-21 DIAGNOSIS — N401 Enlarged prostate with lower urinary tract symptoms: Secondary | ICD-10-CM | POA: Diagnosis not present

## 2017-12-21 DIAGNOSIS — I10 Essential (primary) hypertension: Secondary | ICD-10-CM | POA: Diagnosis not present

## 2017-12-29 DIAGNOSIS — D696 Thrombocytopenia, unspecified: Secondary | ICD-10-CM | POA: Diagnosis not present

## 2017-12-29 DIAGNOSIS — H8111 Benign paroxysmal vertigo, right ear: Secondary | ICD-10-CM | POA: Diagnosis not present

## 2017-12-29 DIAGNOSIS — E119 Type 2 diabetes mellitus without complications: Secondary | ICD-10-CM | POA: Diagnosis not present

## 2017-12-29 DIAGNOSIS — E1165 Type 2 diabetes mellitus with hyperglycemia: Secondary | ICD-10-CM | POA: Diagnosis not present

## 2017-12-29 DIAGNOSIS — Z6836 Body mass index (BMI) 36.0-36.9, adult: Secondary | ICD-10-CM | POA: Diagnosis not present

## 2017-12-29 DIAGNOSIS — N529 Male erectile dysfunction, unspecified: Secondary | ICD-10-CM | POA: Diagnosis not present

## 2017-12-29 DIAGNOSIS — Z96651 Presence of right artificial knee joint: Secondary | ICD-10-CM | POA: Diagnosis not present

## 2017-12-29 DIAGNOSIS — I10 Essential (primary) hypertension: Secondary | ICD-10-CM | POA: Diagnosis not present

## 2017-12-29 DIAGNOSIS — R0982 Postnasal drip: Secondary | ICD-10-CM | POA: Diagnosis not present

## 2017-12-29 DIAGNOSIS — G629 Polyneuropathy, unspecified: Secondary | ICD-10-CM | POA: Diagnosis not present

## 2017-12-29 DIAGNOSIS — L918 Other hypertrophic disorders of the skin: Secondary | ICD-10-CM | POA: Diagnosis not present

## 2017-12-29 DIAGNOSIS — N401 Enlarged prostate with lower urinary tract symptoms: Secondary | ICD-10-CM | POA: Diagnosis not present

## 2018-01-10 DIAGNOSIS — H6983 Other specified disorders of Eustachian tube, bilateral: Secondary | ICD-10-CM | POA: Insufficient documentation

## 2018-01-10 DIAGNOSIS — G4733 Obstructive sleep apnea (adult) (pediatric): Secondary | ICD-10-CM | POA: Diagnosis not present

## 2018-01-10 DIAGNOSIS — R42 Dizziness and giddiness: Secondary | ICD-10-CM | POA: Diagnosis not present

## 2018-03-02 DIAGNOSIS — E119 Type 2 diabetes mellitus without complications: Secondary | ICD-10-CM | POA: Diagnosis not present

## 2018-03-02 DIAGNOSIS — E782 Mixed hyperlipidemia: Secondary | ICD-10-CM | POA: Diagnosis not present

## 2018-03-02 DIAGNOSIS — I10 Essential (primary) hypertension: Secondary | ICD-10-CM | POA: Diagnosis not present

## 2018-03-02 DIAGNOSIS — E1129 Type 2 diabetes mellitus with other diabetic kidney complication: Secondary | ICD-10-CM | POA: Diagnosis not present

## 2018-03-14 DIAGNOSIS — R42 Dizziness and giddiness: Secondary | ICD-10-CM | POA: Diagnosis not present

## 2018-03-14 DIAGNOSIS — R809 Proteinuria, unspecified: Secondary | ICD-10-CM | POA: Diagnosis not present

## 2018-03-14 DIAGNOSIS — D696 Thrombocytopenia, unspecified: Secondary | ICD-10-CM | POA: Diagnosis not present

## 2018-03-14 DIAGNOSIS — E1129 Type 2 diabetes mellitus with other diabetic kidney complication: Secondary | ICD-10-CM | POA: Diagnosis not present

## 2018-03-14 DIAGNOSIS — G9009 Other idiopathic peripheral autonomic neuropathy: Secondary | ICD-10-CM | POA: Diagnosis not present

## 2018-03-14 DIAGNOSIS — Z6837 Body mass index (BMI) 37.0-37.9, adult: Secondary | ICD-10-CM | POA: Diagnosis not present

## 2018-03-14 DIAGNOSIS — E782 Mixed hyperlipidemia: Secondary | ICD-10-CM | POA: Diagnosis not present

## 2018-03-14 DIAGNOSIS — I10 Essential (primary) hypertension: Secondary | ICD-10-CM | POA: Diagnosis not present

## 2018-04-03 ENCOUNTER — Ambulatory Visit (INDEPENDENT_AMBULATORY_CARE_PROVIDER_SITE_OTHER): Payer: Medicare Other | Admitting: Orthopedic Surgery

## 2018-04-03 ENCOUNTER — Encounter: Payer: Self-pay | Admitting: Orthopedic Surgery

## 2018-04-03 ENCOUNTER — Ambulatory Visit (INDEPENDENT_AMBULATORY_CARE_PROVIDER_SITE_OTHER): Payer: Medicare Other

## 2018-04-03 VITALS — BP 177/85 | HR 66 | Ht 71.0 in | Wt 273.0 lb

## 2018-04-03 DIAGNOSIS — Z96652 Presence of left artificial knee joint: Secondary | ICD-10-CM

## 2018-04-03 NOTE — Progress Notes (Signed)
ANNUAL FOLLOW UP FOR  left TKA   Chief Complaint  Patient presents with  . Post-op Follow-up    left total knee replacement 04/14/16     HPI: The patient is here for the annual  follow-up x-ray for knee replacement. The patient is not complaining of pain weakness instability or stiffness in the repaired knee.   Review of Systems  Musculoskeletal:       Stiff after sitting     Past Medical History:  Diagnosis Date  . Arthritis   . Atypical chest pain    Low-risk exercise stress Cardiolite EF 60% April 2008. Reportedly normal remote cardiac catheterization @ [facility] EF 60%...nuclear....2008  . Bradycardia    hospital 03/2010 stopped beta blocker  . Complication of anesthesia   . Diabetes mellitus without complication ([place])   . Dyslipidemia   . Hypertension   . Mild aortic regurgitation with LV dilation by prior echocardiogram   . Obesity   . Obstructive sleep apnea    C-Pap compliant  . PONV (postoperative nausea and vomiting)   . Thrombocytopenia (HCC)    mild follow Dr. Alonna Minium  . Vertigo    Hospital 03/2010     Examination of the left KNEE  BP (!) 177/85   Pulse 66   Ht 5\' 11"  (1.803 m)   Wt 273 lb (123.8 kg)   BMI 38.08 kg/m   General the patient is normally groomed in no distress  Mood normal Affect pleasant   The patient is Awake and alert ; oriented normal   Inspection shows : incision healed nicely without erythema, no tenderness no swelling  Range of motion total range of motion is 125  Stability the knee is stable anterior to posterior as well as medial to lateral  Strength quadriceps strength is normal  Skin no erythema around the skin incision  Cardiovascular NO EDEMA   Neuro: normal sensation in the operative leg   Gait: normal expected gait without cane    Medical decision-making section  X-rays ordered with the following personal interpretation  Normal alignment without loosening   Diagnosis  Encounter Diagnosis  Name Primary?  .  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

## 2018-07-07 DIAGNOSIS — E1165 Type 2 diabetes mellitus with hyperglycemia: Secondary | ICD-10-CM | POA: Diagnosis not present

## 2018-07-07 DIAGNOSIS — N529 Male erectile dysfunction, unspecified: Secondary | ICD-10-CM | POA: Diagnosis not present

## 2018-07-07 DIAGNOSIS — G629 Polyneuropathy, unspecified: Secondary | ICD-10-CM | POA: Diagnosis not present

## 2018-07-07 DIAGNOSIS — H8111 Benign paroxysmal vertigo, right ear: Secondary | ICD-10-CM | POA: Diagnosis not present

## 2018-07-07 DIAGNOSIS — R0982 Postnasal drip: Secondary | ICD-10-CM | POA: Diagnosis not present

## 2018-07-07 DIAGNOSIS — E782 Mixed hyperlipidemia: Secondary | ICD-10-CM | POA: Diagnosis not present

## 2018-07-07 DIAGNOSIS — E119 Type 2 diabetes mellitus without complications: Secondary | ICD-10-CM | POA: Diagnosis not present

## 2018-07-07 DIAGNOSIS — Z6836 Body mass index (BMI) 36.0-36.9, adult: Secondary | ICD-10-CM | POA: Diagnosis not present

## 2018-07-07 DIAGNOSIS — I10 Essential (primary) hypertension: Secondary | ICD-10-CM | POA: Diagnosis not present

## 2018-07-07 DIAGNOSIS — Z96651 Presence of right artificial knee joint: Secondary | ICD-10-CM | POA: Diagnosis not present

## 2018-07-07 DIAGNOSIS — N401 Enlarged prostate with lower urinary tract symptoms: Secondary | ICD-10-CM | POA: Diagnosis not present

## 2018-07-07 DIAGNOSIS — D696 Thrombocytopenia, unspecified: Secondary | ICD-10-CM | POA: Diagnosis not present

## 2018-07-12 DIAGNOSIS — Z0001 Encounter for general adult medical examination with abnormal findings: Secondary | ICD-10-CM | POA: Diagnosis not present

## 2018-07-12 DIAGNOSIS — G9009 Other idiopathic peripheral autonomic neuropathy: Secondary | ICD-10-CM | POA: Diagnosis not present

## 2018-07-12 DIAGNOSIS — E782 Mixed hyperlipidemia: Secondary | ICD-10-CM | POA: Diagnosis not present

## 2018-07-12 DIAGNOSIS — R42 Dizziness and giddiness: Secondary | ICD-10-CM | POA: Diagnosis not present

## 2018-07-12 DIAGNOSIS — E1165 Type 2 diabetes mellitus with hyperglycemia: Secondary | ICD-10-CM | POA: Diagnosis not present

## 2018-07-12 DIAGNOSIS — G4733 Obstructive sleep apnea (adult) (pediatric): Secondary | ICD-10-CM | POA: Diagnosis not present

## 2018-07-12 DIAGNOSIS — I10 Essential (primary) hypertension: Secondary | ICD-10-CM | POA: Diagnosis not present

## 2018-07-12 DIAGNOSIS — D696 Thrombocytopenia, unspecified: Secondary | ICD-10-CM | POA: Diagnosis not present

## 2018-07-12 DIAGNOSIS — Z23 Encounter for immunization: Secondary | ICD-10-CM | POA: Diagnosis not present

## 2018-07-12 DIAGNOSIS — R809 Proteinuria, unspecified: Secondary | ICD-10-CM | POA: Diagnosis not present

## 2018-07-12 DIAGNOSIS — Z6837 Body mass index (BMI) 37.0-37.9, adult: Secondary | ICD-10-CM | POA: Diagnosis not present

## 2018-10-03 DIAGNOSIS — H8110 Benign paroxysmal vertigo, unspecified ear: Secondary | ICD-10-CM | POA: Diagnosis not present

## 2018-10-03 DIAGNOSIS — L723 Sebaceous cyst: Secondary | ICD-10-CM | POA: Diagnosis not present

## 2018-10-03 DIAGNOSIS — G4733 Obstructive sleep apnea (adult) (pediatric): Secondary | ICD-10-CM | POA: Diagnosis not present

## 2018-11-14 DIAGNOSIS — R809 Proteinuria, unspecified: Secondary | ICD-10-CM | POA: Diagnosis not present

## 2018-11-14 DIAGNOSIS — D696 Thrombocytopenia, unspecified: Secondary | ICD-10-CM | POA: Diagnosis not present

## 2018-11-14 DIAGNOSIS — E782 Mixed hyperlipidemia: Secondary | ICD-10-CM | POA: Diagnosis not present

## 2018-11-14 DIAGNOSIS — Z6837 Body mass index (BMI) 37.0-37.9, adult: Secondary | ICD-10-CM | POA: Diagnosis not present

## 2018-11-14 DIAGNOSIS — I10 Essential (primary) hypertension: Secondary | ICD-10-CM | POA: Diagnosis not present

## 2018-11-14 DIAGNOSIS — N529 Male erectile dysfunction, unspecified: Secondary | ICD-10-CM | POA: Diagnosis not present

## 2018-11-14 DIAGNOSIS — E1129 Type 2 diabetes mellitus with other diabetic kidney complication: Secondary | ICD-10-CM | POA: Diagnosis not present

## 2018-11-20 DIAGNOSIS — I10 Essential (primary) hypertension: Secondary | ICD-10-CM | POA: Diagnosis not present

## 2018-11-20 DIAGNOSIS — E1122 Type 2 diabetes mellitus with diabetic chronic kidney disease: Secondary | ICD-10-CM | POA: Diagnosis not present

## 2018-11-20 DIAGNOSIS — N182 Chronic kidney disease, stage 2 (mild): Secondary | ICD-10-CM | POA: Diagnosis not present

## 2018-11-20 DIAGNOSIS — G9009 Other idiopathic peripheral autonomic neuropathy: Secondary | ICD-10-CM | POA: Diagnosis not present

## 2018-11-20 DIAGNOSIS — E782 Mixed hyperlipidemia: Secondary | ICD-10-CM | POA: Diagnosis not present

## 2018-11-20 DIAGNOSIS — E1140 Type 2 diabetes mellitus with diabetic neuropathy, unspecified: Secondary | ICD-10-CM | POA: Diagnosis not present

## 2018-11-20 DIAGNOSIS — G4733 Obstructive sleep apnea (adult) (pediatric): Secondary | ICD-10-CM | POA: Diagnosis not present

## 2018-11-20 DIAGNOSIS — R809 Proteinuria, unspecified: Secondary | ICD-10-CM | POA: Diagnosis not present

## 2018-11-20 DIAGNOSIS — D696 Thrombocytopenia, unspecified: Secondary | ICD-10-CM | POA: Diagnosis not present

## 2018-12-04 ENCOUNTER — Encounter (HOSPITAL_COMMUNITY): Payer: Self-pay | Admitting: Hematology

## 2018-12-04 ENCOUNTER — Inpatient Hospital Stay (HOSPITAL_COMMUNITY): Payer: Medicare Other | Attending: Hematology | Admitting: Hematology

## 2018-12-04 ENCOUNTER — Inpatient Hospital Stay (HOSPITAL_COMMUNITY): Payer: Medicare Other

## 2018-12-04 ENCOUNTER — Other Ambulatory Visit: Payer: Self-pay

## 2018-12-04 VITALS — BP 157/59 | HR 77 | Temp 97.8°F | Resp 16 | Ht 71.0 in | Wt 279.3 lb

## 2018-12-04 DIAGNOSIS — Z7984 Long term (current) use of oral hypoglycemic drugs: Secondary | ICD-10-CM | POA: Diagnosis not present

## 2018-12-04 DIAGNOSIS — Z791 Long term (current) use of non-steroidal anti-inflammatories (NSAID): Secondary | ICD-10-CM | POA: Insufficient documentation

## 2018-12-04 DIAGNOSIS — K760 Fatty (change of) liver, not elsewhere classified: Secondary | ICD-10-CM | POA: Insufficient documentation

## 2018-12-04 DIAGNOSIS — E119 Type 2 diabetes mellitus without complications: Secondary | ICD-10-CM | POA: Diagnosis not present

## 2018-12-04 DIAGNOSIS — E785 Hyperlipidemia, unspecified: Secondary | ICD-10-CM | POA: Insufficient documentation

## 2018-12-04 DIAGNOSIS — Z79899 Other long term (current) drug therapy: Secondary | ICD-10-CM | POA: Diagnosis not present

## 2018-12-04 DIAGNOSIS — D696 Thrombocytopenia, unspecified: Secondary | ICD-10-CM

## 2018-12-04 DIAGNOSIS — E669 Obesity, unspecified: Secondary | ICD-10-CM | POA: Diagnosis not present

## 2018-12-04 DIAGNOSIS — I10 Essential (primary) hypertension: Secondary | ICD-10-CM | POA: Diagnosis not present

## 2018-12-04 LAB — SAVE SMEAR(SSMR), FOR PROVIDER SLIDE REVIEW

## 2018-12-04 LAB — CBC WITH DIFFERENTIAL/PLATELET
Band Neutrophils: 0 %
Basophils Absolute: 0 10*3/uL (ref 0.0–0.1)
Basophils Relative: 0 %
Blasts: 0 %
EOS PCT: 2 %
Eosinophils Absolute: 0.2 10*3/uL (ref 0.0–0.5)
HCT: 43.3 % (ref 39.0–52.0)
Hemoglobin: 14.9 g/dL (ref 13.0–17.0)
LYMPHS ABS: 1.9 10*3/uL (ref 0.7–4.0)
Lymphocytes Relative: 22 %
MCH: 30.5 pg (ref 26.0–34.0)
MCHC: 34.4 g/dL (ref 30.0–36.0)
MCV: 88.7 fL (ref 80.0–100.0)
Metamyelocytes Relative: 0 %
Monocytes Absolute: 0.7 10*3/uL (ref 0.1–1.0)
Monocytes Relative: 8 %
Myelocytes: 0 %
Neutro Abs: 5.7 10*3/uL (ref 1.7–7.7)
Neutrophils Relative %: 68 %
Other: 0 %
Platelets: 58 10*3/uL — ABNORMAL LOW (ref 150–400)
Promyelocytes Relative: 0 %
RBC: 4.88 MIL/uL (ref 4.22–5.81)
RDW: 12.3 % (ref 11.5–15.5)
WBC: 8.5 10*3/uL (ref 4.0–10.5)
nRBC: 0 % (ref 0.0–0.2)
nRBC: 0 /100 WBC

## 2018-12-04 LAB — VITAMIN B12: Vitamin B-12: 739 pg/mL (ref 180–914)

## 2018-12-04 LAB — FOLATE: Folate: 17.7 ng/mL (ref >5.9)

## 2018-12-04 LAB — LACTATE DEHYDROGENASE: LDH: 143 U/L (ref 98–192)

## 2018-12-04 NOTE — Assessment & Plan Note (Signed)
1.  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 - He was seen by Dr. Sonny Dandy in [place] and had a bone marrow biopsy done in 2009 which was reportedly normal. -CT scan of the abdomen and pelvis at that time showed fatty liver with normal spleen. - He had recent CBC done at Dr. Juel Burrow office on 11/14/2018 which showed platelet count of 51.  White count and hemoglobin were within normal limits. -He does report some easy bruising on the upper extremities.  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 -Denies any personal or family history of connective tissue disorders or lupus anticoagulant.  Denies any family history of malignancies. - We will repeat his CBC today.  Will check platelet count and sodium citrate tube.  We will review his smear. -We will rule out nutritional deficiencies by checking B04, folic acid, and copper levels.  We will also do connective tissue disorder work-up including lupus anticoagulant.  We will check his hepatitis panel.  H pylori testing will be done. - If there is no clear cause, one can assume the etiology of low platelet count is due to immune mediated thrombocytopenia. -I will see him back in 2 weeks for follow-up and discuss the results.

## 2018-12-04 NOTE — Patient Instructions (Signed)
[facility] Discharge Instructions     Thank you for choosing [facility] at [facility] to provide your oncology and hematology care.  To afford each patient quality time with our provider, please arrive at least 15 minutes before your scheduled appointment time.   If you have a lab appointment with the Cancer Center please come in thru the  Main Entrance and check in at the main information desk  You need to re-schedule your appointment should you arrive 10 or more minutes late.  We strive to give you quality time with our providers, and arriving late affects you and other patients whose appointments are after yours.  Also, if you no show three or more times for appointments you may be dismissed from the clinic at the providers discretion.     Again, thank you for choosing [facility].  Our hope is that these requests will decrease the amount of time that you wait before being seen by our physicians.       _____________________________________________________________  Should you have questions after your visit to  Cancer Center, please contact our office at [phone] between the hours of 8:00 a.m. and 4:30 p.m.  Voicemails left after 4:00 p.m. will not be returned until the following business day.  For prescription refill requests, have your pharmacy contact our office and allow 72 hours.    Cancer Center Support Programs:   > Cancer Support Group  2nd Tuesday of the month 1pm-2pm, Journey Room    

## 2018-12-04 NOTE — Progress Notes (Signed)
CONSULT NOTE  Patient Care Team: Celene Squibb, MD as PCP - General (General Practice)  CHIEF COMPLAINTS/PURPOSE OF CONSULTATION: Thrombocytopenia  HISTORY OF PRESENTING ILLNESS:  Brian [name] 78 y.o. male is here because of a long history of thrombocytopenia. He has had a decreased platelet count since 2000. He has never needed any treatment nor has it ever given him any problems. He had a few nose bleeds when he was first diagnosed only. He denies any starting any new medications recently that would have caused his platelets to drop lower. Denies any antiplatelet agents. Denies any nausea, vomiting, or diarrhea. Denies any new pains. Had not noticed any recent bleeding such as epistaxis, hematuria or hematochezia. Denies recent chest pain on exertion, shortness of breath on minimal exertion, pre-syncopal episodes, or palpitations. Denies any numbness or tingling in hands. Denies any recent fevers, infections, or recent hospitalizations. Patient reports appetite at 100% and energy level at 75%. He remains active during the day and has no problem maintaining his weight.  He had no prior history or diagnosis of cancer. His age appropriate screening programs are up-to-date. He denies smoking or alcohol daily consumption. Denies any herbal supplements or tonic water.  He lives at home with his wife and he is full functioning and can perform all his on ADLs. He is retired now but worked full time at [employer] prior to his retiring.   MEDICAL HISTORY:  Past Medical History:  Diagnosis Date  . Arthritis   . Atypical chest pain    Low-risk exercise stress Cardiolite EF 60% April 2008. Reportedly normal remote cardiac catheterization @ [place] EF 60%...nuclear....2008  . Bradycardia    hospital 03/2010 stopped beta blocker  . Complication of anesthesia   . Diabetes mellitus without complication ([place])   . Dyslipidemia   . Hypertension   . Mild aortic regurgitation with LV dilation by prior  echocardiogram   . Obesity   . Obstructive sleep apnea    C-Pap compliant  . PONV (postoperative nausea and vomiting)   . Thrombocytopenia (HCC)    mild follow Dr. Alonna Minium  . Vertigo    Hospital 03/2010    SURGICAL HISTORY: Past Surgical History:  Procedure Laterality Date  . CARDIAC CATHETERIZATION    . KNEE ARTHROSCOPY WITH MEDIAL MENISECTOMY Left 05/31/2014   Procedure: LEFT KNEE ARTHROSCOPY WITH MEDIAL MENISECTOMY;  Surgeon: Carole Civil, MD;  Location: [facility];  Service: Orthopedics;  Laterality: Left;  . Status post bilateral arthroscopic knee surgery    . TOTAL KNEE ARTHROPLASTY Left 04/14/2016   Procedure: LEFT TOTAL KNEE ARTHROPLASTY;  Surgeon: Carole Civil, MD;  Location: [facility];  Service: Orthopedics;  Laterality: Left;    SOCIAL HISTORY: Social History   Socioeconomic History  . Marital status: Married    Spouse name: Not on file  . Number of children: 3  . Years of education: Not on file  . Highest education level: Not on file  Occupational History  . Not on file  Social Needs  . Financial resource strain: Not on file  . Food insecurity:    Worry: Not on file    Inability: Not on file  . Transportation needs:    Medical: Not on file    Non-medical: Not on file  Tobacco Use  . Smoking status: Never Smoker  . Smokeless tobacco: Never Used  Substance and Sexual Activity  . Alcohol use: Yes    Comment: wine on occasion  . Drug use: No  .  Sexual activity: Not on file  Lifestyle  . Physical activity:    Days per week: Not on file    Minutes per session: Not on file  . Stress: Not on file  Relationships  . Social connections:    Talks on phone: Not on file    Gets together: Not on file    Attends religious service: Not on file    Active member of club or organization: Not on file    Attends meetings of clubs or organizations: Not on file    Relationship status: Not on file  . Intimate partner violence:    Fear of current or ex partner: Not on  file    Emotionally abused: Not on file    Physically abused: Not on file    Forced sexual activity: Not on file  Other Topics Concern  . Not on file  Social History Narrative  . Not on file    FAMILY HISTORY: Family History  Problem Relation Age of Onset  . Heart attack Mother        fatal, cause of death  . Heart attack Maternal Grandmother     ALLERGIES:  is allergic to penicillins.  MEDICATIONS:  Current Outpatient Medications  Medication Sig Dispense Refill  . amLODipine (NORVASC) 10 MG tablet     . B Complex Vitamins (VITAMIN B-COMPLEX) TABS Take by mouth.    . fluticasone (FLONASE) 50 MCG/ACT nasal spray     . gabapentin (NEURONTIN) 600 MG tablet Take 600 mg by mouth.     Marland Kitchen glipiZIDE (GLUCOTROL XL) 5 MG 24 hr tablet Take by mouth.    . Glucosamine-Chondroit-Vit C-Mn (GLUCOSAMINE 1500 COMPLEX PO) Take 1 tablet by mouth 2 (two) times daily.     Marland Kitchen glucosamine-chondroitin 500-400 MG tablet Take by mouth.    Marland Kitchen ibuprofen (ADVIL,MOTRIN) 600 MG tablet Take 1 tablet (600 mg total) by mouth every 6 (six) hours as needed. 30 tablet 0  . metFORMIN (GLUCOPHAGE) 500 MG tablet Take by mouth.    . olmesartan (BENICAR) 40 MG tablet      No current facility-administered medications for this visit.     REVIEW OF SYSTEMS:   Constitutional: Denies fevers, chills or abnormal night sweats Eyes: Denies blurriness of vision, double vision or watery eyes Ears, nose, mouth, throat, and face: Denies mucositis or sore throat Respiratory: Denies cough, dyspnea or wheezes Cardiovascular: Denies palpitation, chest discomfort or lower extremity swelling Gastrointestinal:  Denies nausea, heartburn or change in bowel habits Skin: + mild bruising on his arms. Lymphatics: Denies new lymphadenopathy or easy bruising Neurological:Denies numbness, tingling or new weaknesses Behavioral/Psych: Mood is stable, no new changes  All other systems were reviewed with the patient and are negative.  PHYSICAL  EXAMINATION: ECOG PERFORMANCE STATUS: 1 - Symptomatic but completely ambulatory  Vitals:   12/04/18 1300  BP: (!) 157/59  Pulse: 77  Resp: 16  Temp: 97.8 F (36.6 C)  SpO2: 94%   Filed Weights   12/04/18 1300  Weight: 279 lb 5 oz (126.7 kg)    GENERAL:alert, no distress and comfortable SKIN: skin color, texture, turgor are normal, no rashes or significant lesions EYES: normal, conjunctiva are pink and non-injected, sclera clear OROPHARYNX:no exudate, no erythema and lips, buccal mucosa, and tongue normal  NECK: supple, thyroid normal size, non-tender, without nodularity LYMPH:  no palpable lymphadenopathy in the cervical, axillary or inguinal LUNGS: clear to auscultation and percussion with normal breathing effort HEART: regular rate & rhythm and no murmurs  and no lower extremity edema ABDOMEN:abdomen soft, non-tender and normal bowel sounds Musculoskeletal:no cyanosis of digits and no clubbing  PSYCH: alert & oriented x 3 with fluent speech NEURO: no focal motor/sensory deficits  LABORATORY DATA:  I have reviewed the data from Dr. Juel Burrow office as listed  RADIOGRAPHIC STUDIES: I have personally reviewed the radiological images as listed and agreed with the findings in the report. I have reviewed Francene Finders, NP's note and agree with the documentation.  I personally performed a face-to-face visit, made revisions and my assessment and plan is as follows.  ASSESSMENT & PLAN:  THROMBOCYTOPENIA 1.  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 - He was seen by Dr. Sonny Dandy in [place] and had a bone marrow biopsy done in 2009 which was reportedly normal. -CT scan of the abdomen and pelvis at that time showed fatty liver with normal spleen. - He had recent CBC done at Dr. Juel Burrow office on 11/14/2018 which showed platelet count of 51.  White count and hemoglobin were within normal limits. -He does report some easy bruising on the  upper extremities.  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 -Denies any personal or family history of connective tissue disorders or lupus anticoagulant.  Denies any family history of malignancies. - We will repeat his CBC today.  Will check platelet count and sodium citrate tube.  We will review his smear. -We will rule out nutritional deficiencies by checking M33, folic acid, and copper levels.  We will also do connective tissue disorder work-up including lupus anticoagulant.  We will check his hepatitis panel.  H pylori testing will be done. - If there is no clear cause, one can assume the etiology of low platelet count is due to immune mediated thrombocytopenia. -I will see him back in 2 weeks for follow-up and discuss the results.     All questions were answered. The patient knows to call the clinic with any problems, questions or concerns.     Derek Jack, MD 12/04/18 4:10 PM

## 2018-12-05 LAB — PROTEIN ELECTROPHORESIS, SERUM
A/G Ratio: 1.3 (ref 0.7–1.7)
Albumin ELP: 3.9 g/dL (ref 2.9–4.4)
Alpha-1-Globulin: 0.2 g/dL (ref 0.0–0.4)
Alpha-2-Globulin: 0.7 g/dL (ref 0.4–1.0)
Beta Globulin: 1 g/dL (ref 0.7–1.3)
Gamma Globulin: 1.1 g/dL (ref 0.4–1.8)
Globulin, Total: 3 g/dL (ref 2.2–3.9)
Total Protein ELP: 6.9 g/dL (ref 6.0–8.5)

## 2018-12-05 LAB — ANTINUCLEAR ANTIBODIES, IFA: ANA Ab, IFA: NEGATIVE

## 2018-12-05 LAB — RHEUMATOID FACTOR

## 2018-12-05 LAB — HEPATITIS PANEL, ACUTE
HCV Ab: <0.1 s/co ratio (ref 0.0–0.9)
HEP B C IGM: NEGATIVE
Hep A IgM: NEGATIVE
Hepatitis B Surface Ag: NEGATIVE

## 2018-12-05 LAB — H PYLORI, IGM, IGG, IGA AB
H Pylori IgG: 0.46 Index Value (ref 0.00–0.79)
H. Pylogi, Iga Abs: <9 units (ref 0.0–8.9)
H. Pylogi, Igm Abs: <9 units (ref 0.0–8.9)

## 2018-12-06 DIAGNOSIS — D696 Thrombocytopenia, unspecified: Secondary | ICD-10-CM | POA: Diagnosis not present

## 2018-12-06 DIAGNOSIS — I10 Essential (primary) hypertension: Secondary | ICD-10-CM | POA: Diagnosis not present

## 2018-12-06 DIAGNOSIS — E669 Obesity, unspecified: Secondary | ICD-10-CM | POA: Diagnosis not present

## 2018-12-06 DIAGNOSIS — E119 Type 2 diabetes mellitus without complications: Secondary | ICD-10-CM | POA: Diagnosis not present

## 2018-12-06 DIAGNOSIS — K760 Fatty (change of) liver, not elsewhere classified: Secondary | ICD-10-CM | POA: Diagnosis not present

## 2018-12-06 DIAGNOSIS — E785 Hyperlipidemia, unspecified: Secondary | ICD-10-CM | POA: Diagnosis not present

## 2018-12-06 LAB — BETA-2-GLYCOPROTEIN I ABS, IGG/M/A
Beta-2 Glyco I IgG: <9 GPI IgG units (ref 0–20)
Beta-2-Glycoprotein I IgA: <9 GPI IgA units (ref 0–25)

## 2018-12-06 LAB — LUPUS ANTICOAGULANT PANEL
DRVVT: 47.2 s — ABNORMAL HIGH (ref 0.0–47.0)
PTT Lupus Anticoagulant: 37 s (ref 0.0–51.9)

## 2018-12-06 LAB — COPPER, SERUM: Copper: 76 ug/dL (ref 72–166)

## 2018-12-06 LAB — DRVVT MIX: dRVVT Mix: 42.3 s (ref 0.0–47.0)

## 2018-12-06 LAB — CARDIOLIPIN ANTIBODIES, IGG, IGM, IGA
Anticardiolipin IgA: <9 APL U/mL (ref 0–11)
Anticardiolipin IgG: <9 GPL U/mL (ref 0–14)
Anticardiolipin IgM: 9 MPL U/mL (ref 0–12)

## 2018-12-06 LAB — PLATELET COUNT: Platelets: 54 10*3/uL — ABNORMAL LOW (ref 150–400)

## 2018-12-27 ENCOUNTER — Other Ambulatory Visit: Payer: Self-pay

## 2018-12-27 ENCOUNTER — Encounter (HOSPITAL_COMMUNITY): Payer: Self-pay | Admitting: Hematology

## 2018-12-27 ENCOUNTER — Inpatient Hospital Stay (HOSPITAL_COMMUNITY): Payer: Medicare Other | Attending: Hematology | Admitting: Hematology

## 2018-12-27 VITALS — BP 148/70 | HR 75 | Temp 98.6°F | Resp 18 | Wt 278.0 lb

## 2018-12-27 DIAGNOSIS — E669 Obesity, unspecified: Secondary | ICD-10-CM | POA: Insufficient documentation

## 2018-12-27 DIAGNOSIS — M129 Arthropathy, unspecified: Secondary | ICD-10-CM | POA: Insufficient documentation

## 2018-12-27 DIAGNOSIS — E119 Type 2 diabetes mellitus without complications: Secondary | ICD-10-CM | POA: Insufficient documentation

## 2018-12-27 DIAGNOSIS — G4730 Sleep apnea, unspecified: Secondary | ICD-10-CM | POA: Diagnosis not present

## 2018-12-27 DIAGNOSIS — Z7984 Long term (current) use of oral hypoglycemic drugs: Secondary | ICD-10-CM | POA: Diagnosis not present

## 2018-12-27 DIAGNOSIS — Z79899 Other long term (current) drug therapy: Secondary | ICD-10-CM | POA: Diagnosis not present

## 2018-12-27 DIAGNOSIS — R001 Bradycardia, unspecified: Secondary | ICD-10-CM | POA: Insufficient documentation

## 2018-12-27 DIAGNOSIS — R42 Dizziness and giddiness: Secondary | ICD-10-CM | POA: Diagnosis not present

## 2018-12-27 DIAGNOSIS — D696 Thrombocytopenia, unspecified: Secondary | ICD-10-CM | POA: Insufficient documentation

## 2018-12-27 DIAGNOSIS — I10 Essential (primary) hypertension: Secondary | ICD-10-CM | POA: Insufficient documentation

## 2018-12-27 DIAGNOSIS — E785 Hyperlipidemia, unspecified: Secondary | ICD-10-CM | POA: Diagnosis not present

## 2018-12-27 NOTE — Progress Notes (Signed)
Brian [name], [place] [address]   CLINIC:  Medical Oncology/Hematology  PCP:  Celene Squibb, MD [place] [address] [phone]   REASON FOR VISIT: Follow-up for moderate thrombocytopenia  CURRENT THERAPY: observation    INTERVAL HISTORY:  Brian [name] 78 y.o. male returns for routine follow-up for moderate thrombocytopenia. He is doing well since his last visit. He has no complaints at this time. Denies any nausea, vomiting, or diarrhea. Denies any new pains. Had not noticed any recent bleeding such as epistaxis, hematuria or hematochezia. Denies recent chest pain on exertion, shortness of breath on minimal exertion, pre-syncopal episodes, or palpitations. Denies any numbness or tingling in hands or feet. Denies any recent fevers, infections, or recent hospitalizations. Patient reports appetite at 100% and energy level at 60%. He is eating well and maintaining his weight at this time.     REVIEW OF SYSTEMS:  Review of Systems  Constitutional: Positive for fatigue.  Respiratory: Positive for cough.   Neurological: Positive for dizziness and numbness.  Hematological: Bruises/bleeds easily.  All other systems reviewed and are negative.    PAST MEDICAL/SURGICAL HISTORY:  Past Medical History:  Diagnosis Date  . Arthritis   . Atypical chest pain    Low-risk exercise stress Cardiolite EF 60% April 2008. Reportedly normal remote cardiac catheterization @ [facility] EF 60%...nuclear....2008  . Bradycardia    hospital 03/2010 stopped beta blocker  . Complication of anesthesia   . Diabetes mellitus without complication ([facility])   . Dyslipidemia   . Hypertension   . Mild aortic regurgitation with LV dilation by prior echocardiogram   . Obesity   . Obstructive sleep apnea    C-Pap compliant  . PONV (postoperative nausea and vomiting)   . Thrombocytopenia (HCC)    mild follow Dr. Alonna Minium  . Vertigo    Hospital 03/2010   Past Surgical  History:  Procedure Laterality Date  . CARDIAC CATHETERIZATION    . KNEE ARTHROSCOPY WITH MEDIAL MENISECTOMY Left 05/31/2014   Procedure: LEFT KNEE ARTHROSCOPY WITH MEDIAL MENISECTOMY;  Surgeon: Carole Civil, MD;  Location: [facility];  Service: Orthopedics;  Laterality: Left;  . Status post bilateral arthroscopic knee surgery    . 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 Marital status: Married    Spouse name: Not on file  . Number of children: 3  . Years of education: Not on file  . Highest education level: Not on file  Occupational History  . Not on file  Social Needs  . Financial resource strain: Not on file  . Food insecurity:    Worry: Not on file    Inability: Not on file  . Transportation needs:    Medical: Not on file    Non-medical: Not on file  Tobacco Use  . Smoking status: Never Smoker  . Smokeless tobacco: Never Used  Substance and Sexual Activity  . Alcohol use: Yes    Comment: wine on occasion  . Drug use: No  . Sexual activity: Not on file  Lifestyle  . Physical activity:    Days per week: Not on file    Minutes per session: Not on file  . Stress: Not on file  Relationships  . Social connections:  Talks on phone: Not on file    Gets together: Not on file    Attends religious service: Not on file    Active member of club or organization: Not on file    Attends meetings of clubs or organizations: Not on file    Relationship status: Not on file  . Intimate partner violence:    Fear of current or ex partner: Not on file    Emotionally abused: Not on file    Physically abused: Not on file    Forced sexual activity: Not on file  Other Topics Concern  . Not on file  Social History Narrative  . Not on file    FAMILY HISTORY:  Family History    Problem Relation Age of Onset  . Heart attack Mother        fatal, cause of death  . Heart attack Maternal Grandmother     CURRENT MEDICATIONS:  Outpatient Encounter Medications as of 12/27/2018  Medication Sig  . amLODipine (NORVASC) 10 MG tablet   . aspirin-acetaminophen-caffeine (EXCEDRIN MIGRAINE) 250-250-65 MG tablet Take 1 tablet by mouth every 6 (six) hours as needed for headache.  . B Complex Vitamins (VITAMIN B-COMPLEX) TABS Take by mouth.  . fluticasone (FLONASE) 50 MCG/ACT nasal spray   . gabapentin (NEURONTIN) 600 MG tablet Take 600 mg by mouth.   Marland Kitchen glipiZIDE (GLUCOTROL XL) 5 MG 24 hr tablet Take by mouth.  . Glucosamine-Chondroit-Vit C-Mn (GLUCOSAMINE 1500 COMPLEX PO) Take 1 tablet by mouth 2 (two) times daily.   Marland Kitchen glucosamine-chondroitin 500-400 MG tablet Take by mouth.  . metFORMIN (GLUCOPHAGE) 500 MG tablet Take by mouth.  . olmesartan (BENICAR) 40 MG tablet   . ibuprofen (ADVIL,MOTRIN) 600 MG tablet Take 1 tablet (600 mg total) by mouth every 6 (six) hours as needed. (Patient not taking: Reported on 12/27/2018)   No facility-administered encounter medications on file as of 12/27/2018.     ALLERGIES:  Allergies  Allergen Reactions  . Penicillins Other (See Comments)    DIZZINESS Has patient had a PCN reaction causing immediate rash, facial/tongue/throat swelling, SOB or lightheadedness with hypotension: No Has patient had a PCN reaction causing severe rash involving mucus membranes or skin necrosis: No Has patient had a PCN reaction that required hospitalization No Has patient had a PCN reaction occurring within the last 10 years: No If all of the above answers are "NO", then may proceed with Cephalosporin use.      PHYSICAL EXAM:  ECOG Performance status: 1  Vitals:   12/27/18 1000  BP: (!) 148/70  Pulse: 75  Resp: 18  Temp: 98.6 F (37 C)  SpO2: 97%   Filed Weights   12/27/18 1000  Weight: 278 lb (126.1 kg)    Physical Exam Constitutional:       Appearance: Normal appearance. He is normal weight.  Musculoskeletal: Normal range of motion.  Skin:    General: Skin is warm and dry.  Neurological:     Mental Status: He is alert and oriented to person, place, and time. Mental status is at baseline.  Psychiatric:        Mood and Affect: Mood normal.        Behavior: Behavior normal.        Thought Content: Thought content normal.        Judgment: Judgment normal.      LABORATORY DATA:  I have reviewed the labs as listed.  CBC    Component Value Date/Time   WBC 8.5  12/04/2018 1415   RBC 4.88 12/04/2018 1415   HGB 14.9 12/04/2018 1415   HCT 43.3 12/04/2018 1415   PLT 54 (L) 12/06/2018 0950   MCV 88.7 12/04/2018 1415   MCH 30.5 12/04/2018 1415   MCHC 34.4 12/04/2018 1415   RDW 12.3 12/04/2018 1415   LYMPHSABS 1.9 12/04/2018 1415   MONOABS 0.7 12/04/2018 1415   EOSABS 0.2 12/04/2018 1415   BASOSABS 0.0 12/04/2018 1415   CMP Latest Ref Rng & Units 04/15/2016 04/12/2016 05/28/2014  Glucose 65 - 99 mg/dL 157(H) 152(H) 159(H)  BUN 6 - 20 mg/dL 19 15 12   Creatinine 0.61 - 1.24 mg/dL 0.95 0.74 0.71  Sodium 135 - 145 mmol/L 133(L) 133(L) 138  Potassium 3.5 - 5.1 mmol/L 3.9 3.6 3.8  Chloride 101 - 111 mmol/L 100(L) 105 101  CO2 22 - 32 mmol/L 26 22 23   Calcium 8.9 - 10.3 mg/dL 8.1(L) 8.7(L) 8.9       DIAGNOSTIC IMAGING:  I have independently reviewed the scans and discussed with the patient.   I have reviewed Brian Finders, NP's note and agree with the documentation.  I personally performed a face-to-face visit, made revisions and my assessment and plan is as follows.    ASSESSMENT & PLAN:   THROMBOCYTOPENIA 1.  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 - He was seen by Dr. Sonny Dandy in [place] and had a bone marrow biopsy done in 2009 which was reportedly normal. -CT scan of the abdomen and pelvis at that time showed fatty liver with normal spleen. - He had recent CBC  done at Dr. Juel Burrow office on 11/14/2018 which showed platelet count of 51.  White count and hemoglobin were within normal limits. -He has some easy bruising on the upper extremities.  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 -No family history of connective tissue disorders or malignancies. - Repeat CBC showed platelet count of 58.  No clumping was seen.  Extensive work-up for connective tissue disorders were negative.  Infectious work-up for H. pylori and hepatitis panel was negative.  LDH was normal.  SPEP was normal.  Nutrition work-up for Z61, folic acid and copper were normal. 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  We will initiate therapy once platelet count is less than 30.  I will plan to see him back in 4 months with repeat CBC and LDH.  He was told to come back sooner if he notices any active bleeding.      Orders placed this encounter:  Orders Placed This Encounter  Procedures  . Lactate dehydrogenase  . CBC with Differential      Derek Jack, MD [place] [phone]

## 2018-12-27 NOTE — Patient Instructions (Addendum)
[place] at [facility] Discharge Instructions   You were seen today by Dr. Delton Coombes, he went over your lab results and all tests were normal. He discussed your history as well. We will just keep an eye on things at this time. We will see you back in 4 months for repeat labs and follow up.    Thank you for choosing [place] at [facility] to provide your oncology and hematology care.  To afford each patient quality time with our provider, please arrive at least 15 minutes before your scheduled appointment time.   If you have a lab appointment with the [facility] please come in thru the  Main Entrance and check in at the main information desk  You need to re-schedule your appointment should you arrive 10 or more minutes late.  We strive to give you quality time with our providers, and arriving late affects you and other patients whose appointments are after yours.  Also, if you no show three or more times for appointments you may be dismissed from the clinic at the providers discretion.     Again, thank you for choosing [facility].  Our hope is that these requests will decrease the amount of time that you wait before being seen by our physicians.       _____________________________________________________________  Should you have questions after your visit to [facility], please contact our office at [phone] between the hours of 8:00 a.m. and 4:30 p.m.  Voicemails left after 4:00 p.m. will not be returned until the following business day.  For prescription refill requests, have your pharmacy contact our office and allow 72 hours.    Cancer Center Support Programs:   > Cancer Support Group  2nd Tuesday of the month 1pm-2pm, Journey Room

## 2018-12-27 NOTE — Assessment & Plan Note (Signed)
1.  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 - He was seen by Dr. Sonny Dandy in [place] and had a bone marrow biopsy done in 2009 which was reportedly normal. -CT scan of the abdomen and pelvis at that time showed fatty liver with normal spleen. - He had recent CBC done at Dr. Juel Burrow office on 11/14/2018 which showed platelet count of 51.  White count and hemoglobin were within normal limits. -He has some easy bruising on the upper extremities.  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 -No family history of connective tissue disorders or malignancies. - Repeat CBC showed platelet count of 58.  No clumping was seen.  Extensive work-up for connective tissue disorders were negative.  Infectious work-up for H. pylori and hepatitis panel was negative.  LDH was normal.  SPEP was normal.  Nutrition work-up for R67, folic acid and copper were normal. 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  We will initiate therapy once platelet count is less than 30.  I will plan to see him back in 4 months with repeat CBC and LDH.  He was told to come back sooner if he notices any active bleeding.

## 2019-01-05 DIAGNOSIS — H6120 Impacted cerumen, unspecified ear: Secondary | ICD-10-CM | POA: Diagnosis not present

## 2019-01-05 DIAGNOSIS — H9191 Unspecified hearing loss, right ear: Secondary | ICD-10-CM | POA: Diagnosis not present

## 2019-01-09 ENCOUNTER — Other Ambulatory Visit (HOSPITAL_COMMUNITY): Payer: Self-pay | Admitting: Adult Health Nurse Practitioner

## 2019-01-09 ENCOUNTER — Other Ambulatory Visit: Payer: Self-pay | Admitting: Adult Health Nurse Practitioner

## 2019-01-09 DIAGNOSIS — R519 Headache, unspecified: Secondary | ICD-10-CM

## 2019-01-09 DIAGNOSIS — H9191 Unspecified hearing loss, right ear: Secondary | ICD-10-CM | POA: Diagnosis not present

## 2019-01-09 DIAGNOSIS — H9201 Otalgia, right ear: Secondary | ICD-10-CM | POA: Diagnosis not present

## 2019-01-09 DIAGNOSIS — M542 Cervicalgia: Secondary | ICD-10-CM | POA: Diagnosis not present

## 2019-01-09 DIAGNOSIS — H9190 Unspecified hearing loss, unspecified ear: Secondary | ICD-10-CM

## 2019-01-09 DIAGNOSIS — R51 Headache: Principal | ICD-10-CM

## 2019-01-19 DIAGNOSIS — H9191 Unspecified hearing loss, right ear: Secondary | ICD-10-CM | POA: Diagnosis not present

## 2019-01-19 DIAGNOSIS — M542 Cervicalgia: Secondary | ICD-10-CM | POA: Diagnosis not present

## 2019-01-24 ENCOUNTER — Ambulatory Visit (HOSPITAL_COMMUNITY): Admission: RE | Admit: 2019-01-24 | Payer: Medicare Other | Source: Ambulatory Visit

## 2019-01-24 ENCOUNTER — Encounter (HOSPITAL_COMMUNITY): Payer: Self-pay

## 2019-03-20 DIAGNOSIS — I10 Essential (primary) hypertension: Secondary | ICD-10-CM | POA: Diagnosis not present

## 2019-03-20 DIAGNOSIS — E1129 Type 2 diabetes mellitus with other diabetic kidney complication: Secondary | ICD-10-CM | POA: Diagnosis not present

## 2019-03-20 DIAGNOSIS — E1165 Type 2 diabetes mellitus with hyperglycemia: Secondary | ICD-10-CM | POA: Diagnosis not present

## 2019-03-20 DIAGNOSIS — E782 Mixed hyperlipidemia: Secondary | ICD-10-CM | POA: Diagnosis not present

## 2019-03-20 DIAGNOSIS — E1122 Type 2 diabetes mellitus with diabetic chronic kidney disease: Secondary | ICD-10-CM | POA: Diagnosis not present

## 2019-03-20 DIAGNOSIS — E1140 Type 2 diabetes mellitus with diabetic neuropathy, unspecified: Secondary | ICD-10-CM | POA: Diagnosis not present

## 2019-03-26 DIAGNOSIS — I129 Hypertensive chronic kidney disease with stage 1 through stage 4 chronic kidney disease, or unspecified chronic kidney disease: Secondary | ICD-10-CM | POA: Diagnosis not present

## 2019-03-26 DIAGNOSIS — E1140 Type 2 diabetes mellitus with diabetic neuropathy, unspecified: Secondary | ICD-10-CM | POA: Diagnosis not present

## 2019-03-26 DIAGNOSIS — N182 Chronic kidney disease, stage 2 (mild): Secondary | ICD-10-CM | POA: Diagnosis not present

## 2019-03-26 DIAGNOSIS — G9009 Other idiopathic peripheral autonomic neuropathy: Secondary | ICD-10-CM | POA: Diagnosis not present

## 2019-03-26 DIAGNOSIS — L720 Epidermal cyst: Secondary | ICD-10-CM | POA: Diagnosis not present

## 2019-03-26 DIAGNOSIS — E782 Mixed hyperlipidemia: Secondary | ICD-10-CM | POA: Diagnosis not present

## 2019-03-26 DIAGNOSIS — R809 Proteinuria, unspecified: Secondary | ICD-10-CM | POA: Diagnosis not present

## 2019-03-26 DIAGNOSIS — D696 Thrombocytopenia, unspecified: Secondary | ICD-10-CM | POA: Diagnosis not present

## 2019-03-26 DIAGNOSIS — E1165 Type 2 diabetes mellitus with hyperglycemia: Secondary | ICD-10-CM | POA: Diagnosis not present

## 2019-03-26 DIAGNOSIS — E1122 Type 2 diabetes mellitus with diabetic chronic kidney disease: Secondary | ICD-10-CM | POA: Diagnosis not present

## 2019-03-26 DIAGNOSIS — G4733 Obstructive sleep apnea (adult) (pediatric): Secondary | ICD-10-CM | POA: Diagnosis not present

## 2019-03-26 DIAGNOSIS — L259 Unspecified contact dermatitis, unspecified cause: Secondary | ICD-10-CM | POA: Diagnosis not present

## 2019-04-09 ENCOUNTER — Other Ambulatory Visit: Payer: Self-pay

## 2019-04-09 ENCOUNTER — Encounter: Payer: Self-pay | Admitting: Orthopedic Surgery

## 2019-04-09 ENCOUNTER — Ambulatory Visit (INDEPENDENT_AMBULATORY_CARE_PROVIDER_SITE_OTHER): Payer: Medicare Other

## 2019-04-09 ENCOUNTER — Ambulatory Visit (INDEPENDENT_AMBULATORY_CARE_PROVIDER_SITE_OTHER): Payer: Medicare Other | Admitting: Orthopedic Surgery

## 2019-04-09 VITALS — BP 163/90 | HR 70 | Temp 97.0°F | Ht 71.0 in | Wt 273.0 lb

## 2019-04-09 DIAGNOSIS — Z96652 Presence of left artificial knee joint: Secondary | ICD-10-CM

## 2019-04-09 NOTE — Progress Notes (Signed)
ANNUAL FOLLOW UP FOR  LEFT TKA   Chief Complaint  Patient presents with  . Routine Post Op    Lt TKR DOS 04/14/16     HPI: The patient is here for the annual  follow-up x-ray for knee replacement. The patient is not complaining of pain weakness instability or stiffness in the repaired knee.   Review of Systems  Musculoskeletal:       Right knee stiffness    Examination of the left  KNEE  BP (!) 163/90   Pulse 70   Temp (!) 97 F (36.1 C)   Ht 5\' 11"  (1.803 m)   Wt 273 lb (123.8 kg)   BMI 38.08 kg/m   General the patient is normally groomed in no distress  Inspection shows : incision healed nicely without erythema, no tenderness no swelling  Range of motion total range of motion is 125  Stability the knee is stable anterior to posterior as well as medial to lateral  Strength quadriceps strength is normal  Skin no erythema around the skin incision  Neuro: normal sensation in the operative leg   Gait: normal expected gait without cane    Medical decision-making section  X-rays ordered, internal imaging shows (see full dictated report) stable implant with no signs of loosening  Diagnosis  Encounter Diagnosis  Name Primary?  . 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

## 2019-04-30 ENCOUNTER — Other Ambulatory Visit (HOSPITAL_COMMUNITY): Payer: Medicare Other

## 2019-05-01 ENCOUNTER — Ambulatory Visit (HOSPITAL_COMMUNITY): Payer: Medicare Other | Admitting: Hematology

## 2019-05-08 DIAGNOSIS — H11153 Pinguecula, bilateral: Secondary | ICD-10-CM | POA: Diagnosis not present

## 2019-05-08 DIAGNOSIS — H2513 Age-related nuclear cataract, bilateral: Secondary | ICD-10-CM | POA: Diagnosis not present

## 2019-05-08 DIAGNOSIS — H524 Presbyopia: Secondary | ICD-10-CM | POA: Diagnosis not present

## 2019-05-08 DIAGNOSIS — H35032 Hypertensive retinopathy, left eye: Secondary | ICD-10-CM | POA: Diagnosis not present

## 2019-05-08 DIAGNOSIS — H47093 Other disorders of optic nerve, not elsewhere classified, bilateral: Secondary | ICD-10-CM | POA: Diagnosis not present

## 2019-05-08 DIAGNOSIS — Z7984 Long term (current) use of oral hypoglycemic drugs: Secondary | ICD-10-CM | POA: Diagnosis not present

## 2019-05-08 DIAGNOSIS — I10 Essential (primary) hypertension: Secondary | ICD-10-CM | POA: Diagnosis not present

## 2019-05-08 DIAGNOSIS — E119 Type 2 diabetes mellitus without complications: Secondary | ICD-10-CM | POA: Diagnosis not present

## 2019-05-08 DIAGNOSIS — H35031 Hypertensive retinopathy, right eye: Secondary | ICD-10-CM | POA: Diagnosis not present

## 2019-05-08 DIAGNOSIS — H35033 Hypertensive retinopathy, bilateral: Secondary | ICD-10-CM | POA: Diagnosis not present

## 2019-05-16 ENCOUNTER — Other Ambulatory Visit: Payer: Self-pay

## 2019-05-16 ENCOUNTER — Inpatient Hospital Stay (HOSPITAL_COMMUNITY): Payer: Medicare Other | Attending: Hematology

## 2019-05-16 DIAGNOSIS — E669 Obesity, unspecified: Secondary | ICD-10-CM | POA: Diagnosis not present

## 2019-05-16 DIAGNOSIS — K603 Anal fistula: Secondary | ICD-10-CM | POA: Insufficient documentation

## 2019-05-16 DIAGNOSIS — Z7984 Long term (current) use of oral hypoglycemic drugs: Secondary | ICD-10-CM | POA: Insufficient documentation

## 2019-05-16 DIAGNOSIS — R42 Dizziness and giddiness: Secondary | ICD-10-CM | POA: Diagnosis not present

## 2019-05-16 DIAGNOSIS — D696 Thrombocytopenia, unspecified: Secondary | ICD-10-CM

## 2019-05-16 DIAGNOSIS — E119 Type 2 diabetes mellitus without complications: Secondary | ICD-10-CM | POA: Diagnosis not present

## 2019-05-16 DIAGNOSIS — M129 Arthropathy, unspecified: Secondary | ICD-10-CM | POA: Insufficient documentation

## 2019-05-16 DIAGNOSIS — R000 Tachycardia, unspecified: Secondary | ICD-10-CM | POA: Insufficient documentation

## 2019-05-16 DIAGNOSIS — G4730 Sleep apnea, unspecified: Secondary | ICD-10-CM | POA: Insufficient documentation

## 2019-05-16 DIAGNOSIS — E785 Hyperlipidemia, unspecified: Secondary | ICD-10-CM | POA: Diagnosis not present

## 2019-05-16 DIAGNOSIS — Z79899 Other long term (current) drug therapy: Secondary | ICD-10-CM | POA: Insufficient documentation

## 2019-05-16 DIAGNOSIS — R079 Chest pain, unspecified: Secondary | ICD-10-CM | POA: Diagnosis not present

## 2019-05-16 DIAGNOSIS — K760 Fatty (change of) liver, not elsewhere classified: Secondary | ICD-10-CM | POA: Insufficient documentation

## 2019-05-16 LAB — CBC WITH DIFFERENTIAL/PLATELET
Abs Immature Granulocytes: 0.03 10*3/uL (ref 0.00–0.07)
Basophils Absolute: 0 10*3/uL (ref 0.0–0.1)
Basophils Relative: 1 %
Eosinophils Absolute: 0.4 10*3/uL (ref 0.0–0.5)
Eosinophils Relative: 5 %
HCT: 44.3 % (ref 39.0–52.0)
Hemoglobin: 15.4 g/dL (ref 13.0–17.0)
Immature Granulocytes: 0 %
Lymphocytes Relative: 23 %
Lymphs Abs: 2 10*3/uL (ref 0.7–4.0)
MCH: 31.1 pg (ref 26.0–34.0)
MCHC: 34.8 g/dL (ref 30.0–36.0)
MCV: 89.5 fL (ref 80.0–100.0)
Monocytes Absolute: 0.7 10*3/uL (ref 0.1–1.0)
Monocytes Relative: 8 %
Neutro Abs: 5.3 10*3/uL (ref 1.7–7.7)
Neutrophils Relative %: 63 %
Platelets: 56 10*3/uL — ABNORMAL LOW (ref 150–400)
RBC: 4.95 MIL/uL (ref 4.22–5.81)
RDW: 12.2 % (ref 11.5–15.5)
WBC: 8.4 10*3/uL (ref 4.0–10.5)
nRBC: 0 % (ref 0.0–0.2)

## 2019-05-16 LAB — LACTATE DEHYDROGENASE: LDH: 142 U/L (ref 98–192)

## 2019-05-17 DIAGNOSIS — R21 Rash and other nonspecific skin eruption: Secondary | ICD-10-CM | POA: Diagnosis not present

## 2019-05-23 ENCOUNTER — Inpatient Hospital Stay (HOSPITAL_BASED_OUTPATIENT_CLINIC_OR_DEPARTMENT_OTHER): Payer: Medicare Other | Admitting: Hematology

## 2019-05-23 ENCOUNTER — Other Ambulatory Visit: Payer: Self-pay

## 2019-05-23 ENCOUNTER — Encounter (HOSPITAL_COMMUNITY): Payer: Self-pay | Admitting: Hematology

## 2019-05-23 DIAGNOSIS — R42 Dizziness and giddiness: Secondary | ICD-10-CM | POA: Diagnosis not present

## 2019-05-23 DIAGNOSIS — R079 Chest pain, unspecified: Secondary | ICD-10-CM

## 2019-05-23 DIAGNOSIS — Z79899 Other long term (current) drug therapy: Secondary | ICD-10-CM

## 2019-05-23 DIAGNOSIS — E785 Hyperlipidemia, unspecified: Secondary | ICD-10-CM | POA: Diagnosis not present

## 2019-05-23 DIAGNOSIS — K603 Anal fistula: Secondary | ICD-10-CM

## 2019-05-23 DIAGNOSIS — G4730 Sleep apnea, unspecified: Secondary | ICD-10-CM | POA: Diagnosis not present

## 2019-05-23 DIAGNOSIS — E669 Obesity, unspecified: Secondary | ICD-10-CM | POA: Diagnosis not present

## 2019-05-23 DIAGNOSIS — E119 Type 2 diabetes mellitus without complications: Secondary | ICD-10-CM

## 2019-05-23 DIAGNOSIS — M129 Arthropathy, unspecified: Secondary | ICD-10-CM | POA: Diagnosis not present

## 2019-05-23 DIAGNOSIS — K760 Fatty (change of) liver, not elsewhere classified: Secondary | ICD-10-CM

## 2019-05-23 DIAGNOSIS — R000 Tachycardia, unspecified: Secondary | ICD-10-CM | POA: Diagnosis not present

## 2019-05-23 DIAGNOSIS — D696 Thrombocytopenia, unspecified: Secondary | ICD-10-CM | POA: Diagnosis not present

## 2019-05-23 DIAGNOSIS — Z7984 Long term (current) use of oral hypoglycemic drugs: Secondary | ICD-10-CM

## 2019-05-23 NOTE — Assessment & Plan Note (Signed)
1.  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 - He was seen by Dr. Sonny Dandy in [place] and had a bone marrow biopsy done in 2009 which was reportedly normal. -CT scan of the abdomen and pelvis at that time showed fatty liver with normal spleen. - He had recent CBC done at Dr. Juel Burrow office on 11/14/2018 which showed platelet count of 51.  White count and hemoglobin were within normal limits. -He has some easy bruising on the upper extremities.  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 -No family history of connective tissue disorders or malignancies. - Peripheral smear did not reveal any clumping was seen.  Extensive work-up for connective tissue disorders were negative.  Infectious work-up for H. pylori and hepatitis panel was negative.  LDH was normal.  SPEP was normal.  Nutrition work-up for A56, folic acid and copper were normal. 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  We will initiate therapy once platelet count is less than 30.  - CBC today revealed a platelet count of 56,000.  Patient has no active bleeding.  We will continue to monitor platelet count. -Return to clinic in 6 months or sooner if needed.

## 2019-05-23 NOTE — Progress Notes (Signed)
Brian [name], [place] [address]   CLINIC:  Medical Oncology/Hematology  PCP:  Celene Squibb, MD [place] [address] [phone]   REASON FOR VISIT:  Follow-up for thrombocytopenia  CURRENT THERAPY: Clinical surveillance    INTERVAL HISTORY:  Brian [name] 78 y.o. male presents today for follow-up.  Reports overall doing well.  He denies any significant fatigue.  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  He states he follows with GI.  He also follows with his primary care provider every 4 months and obtains lab work there.  He denies any chest pain, shortness of breath.  No lightheadedness or dizziness.  No abdominal pains or change in bowel habits.  Denies any fevers, chills, night sweats.  No weight loss.   REVIEW OF SYSTEMS:  Review of Systems  All other systems reviewed and are negative.    PAST MEDICAL/SURGICAL HISTORY:  Past Medical History:  Diagnosis Date   Arthritis    Atypical chest pain    Low-risk exercise stress Cardiolite EF 60% April 2008. Reportedly normal remote cardiac catheterization @ [facility] EF 60%...nuclear....2008   Bradycardia    hospital 03/2010 stopped beta blocker   Complication of anesthesia    Diabetes mellitus without complication ([place])    Dyslipidemia    Hypertension    Mild aortic regurgitation with LV dilation by prior echocardiogram    Obesity    Obstructive sleep apnea    C-Pap compliant   PONV (postoperative nausea and vomiting)    Thrombocytopenia ([facility])    mild follow Dr. Alonna Minium   [facility] 03/2010   Past Surgical History:  Procedure Laterality Date   CARDIAC CATHETERIZATION     KNEE ARTHROSCOPY WITH MEDIAL MENISECTOMY Left 05/31/2014   Procedure: LEFT KNEE ARTHROSCOPY WITH MEDIAL MENISECTOMY;  Surgeon: Carole Civil, MD;  Location: [facility];  Service: Orthopedics;  Laterality: Left;   Status post bilateral arthroscopic knee surgery      TOTAL KNEE ARTHROPLASTY Left 04/14/2016   Procedure: LEFT TOTAL KNEE ARTHROPLASTY;  Surgeon: Carole Civil, MD;  Location: [facility];  Service: Orthopedics;  Laterality: Left;     SOCIAL HISTORY:  Social History   Socioeconomic History   Marital status: Married    Spouse name: Not on file   Number of children: 3   Years of education: Not on file   Highest education level: Not on file  Occupational History   Not on file  Social Needs   Financial resource strain: Not on file   Food insecurity    Worry: Not on file    Inability: Not on file   Transportation needs    Medical: Not on file    Non-medical: Not on file  Tobacco Use   Smoking status: Never Smoker   Smokeless tobacco: Never Used  Substance and Sexual Activity   Alcohol use: Yes    Comment: wine on occasion   Drug use: No   Sexual activity: Not on file  Lifestyle   Physical activity    Days per week: Not on file    Minutes per session: Not on file   Stress: Not on file  Relationships   Social connections    Talks on phone: Not on file    Gets together: Not on file    Attends religious service: Not on file    Active member of club or organization: Not on file  Attends meetings of clubs or organizations: Not on file  °  Relationship status: Not on file  °• Intimate partner violence  °  Fear of current or ex partner: Not on file  °  Emotionally abused: Not on file  °  Physically abused: Not on file  °  Forced sexual activity: Not on file  °Other Topics Concern  °• Not on file  °Social History Narrative  °• Not on file  ° ° °FAMILY HISTORY:  °Family History  °Problem Relation Age of Onset  °• Heart attack Mother   °     fatal, cause of death  °• Heart attack Maternal Grandmother   ° ° °CURRENT MEDICATIONS:  °Outpatient Encounter Medications as of 05/23/2019  °Medication Sig  °• amLODipine (NORVASC) 10 MG tablet Take 10 mg by mouth daily.   °• aspirin-acetaminophen-caffeine (EXCEDRIN MIGRAINE)  250-250-65 MG tablet Take 1 tablet by mouth every 6 (six) hours as needed for headache.  °• B Complex Vitamins (VITAMIN B-COMPLEX) TABS Take 1 tablet by mouth daily.   °• fluticasone (FLONASE) 50 MCG/ACT nasal spray Place 1 spray into both nostrils daily.   °• gabapentin (NEURONTIN) 600 MG tablet Take 600 mg by mouth daily.   °• glipiZIDE (GLUCOTROL XL) 5 MG 24 hr tablet Take 5 mg by mouth 2 (two) times daily.   °• Glucosamine-Chondroit-Vit C-Mn (GLUCOSAMINE 1500 COMPLEX PO) Take 1 tablet by mouth daily.   °• metFORMIN (GLUCOPHAGE) 500 MG tablet Take 500 mg by mouth 3 (three) times daily.   °• olmesartan (BENICAR) 40 MG tablet Take 40 mg by mouth daily.   °• triamcinolone cream (KENALOG) 0.5 % Apply 1 application topically as needed.   °• [DISCONTINUED] gabapentin (NEURONTIN) 100 MG capsule   °• [DISCONTINUED] glucosamine-chondroitin 500-400 MG tablet Take by mouth.  °• [DISCONTINUED] ibuprofen (ADVIL,MOTRIN) 600 MG tablet Take 1 tablet (600 mg total) by mouth every 6 (six) hours as needed. (Patient not taking: Reported on 12/27/2018)  ° °No facility-administered encounter medications on file as of 05/23/2019.   ° ° °ALLERGIES:  °Allergies  °Allergen Reactions  °• Penicillins Other (See Comments)  °  DIZZINESS °Has patient had a PCN reaction causing immediate rash, facial/tongue/throat swelling, SOB or lightheadedness with hypotension: No °Has patient had a PCN reaction causing severe rash involving mucus membranes or skin necrosis: No °Has patient had a PCN reaction that required hospitalization No °Has patient had a PCN reaction occurring within the last 10 years: No °If all of the above answers are "NO", then may proceed with Cephalosporin use. ° °DIZZINESS °Has patient had a PCN reaction causing immediate rash, facial/tongue/throat swelling, SOB or lightheadedness with hypotension: No °Has patient had a PCN reaction causing severe rash involving mucus membranes or skin necrosis: No °Has patient had a PCN reaction  that required hospitalization No °Has patient had a PCN reaction occurring within the last 10 years: No °If all of the above answers are "NO", then may proceed with Cephalosporin use.  ° ° ° °PHYSICAL EXAM:  °ECOG Performance status: 1 ° °Vitals:  ° 05/23/19 1124  °BP: (!) 155/82  °Pulse: 75  °Resp: 14  °Temp: (!) 97.5 °F (36.4 °C)  °SpO2: 94%  ° °Filed Weights  ° 05/23/19 1124  °Weight: 272 lb 6.4 oz (123.6 kg)  ° ° °Physical Exam °Constitutional:   °   Appearance: Normal appearance. He is obese.  °HENT:  °   Head: Normocephalic.  °   Nose: Nose normal.  °     Mouth/Throat:  °   Mouth: Mucous membranes are moist.  °   Pharynx: Oropharynx is clear.  °Eyes:  °   Extraocular Movements: Extraocular movements intact.  °   Conjunctiva/sclera: Conjunctivae normal.  °Neck:  °   Musculoskeletal: Normal range of motion.  °Cardiovascular:  °   Rate and Rhythm: Normal rate and regular rhythm.  °   Pulses: Normal pulses.  °   Heart sounds: Normal heart sounds.  °Pulmonary:  °   Effort: Pulmonary effort is normal.  °   Breath sounds: Normal breath sounds.  °Abdominal:  °   General: Bowel sounds are normal.  °   Palpations: Abdomen is soft.  °Musculoskeletal: Normal range of motion.  °Skin: °   General: Skin is warm.  °Neurological:  °   General: No focal deficit present.  °   Mental Status: He is alert and oriented to person, place, and time.  °Psychiatric:     °   Mood and Affect: Mood normal.     °   Behavior: Behavior normal.     °   Thought Content: Thought content normal.     °   Judgment: Judgment normal.  ° ° ° ° °LABORATORY DATA:  °I have reviewed the labs as listed.  °CBC °   °Component Value Date/Time  ° WBC 8.4 05/16/2019 1005  ° RBC 4.95 05/16/2019 1005  ° HGB 15.4 05/16/2019 1005  ° HCT 44.3 05/16/2019 1005  ° PLT 56 (L) 05/16/2019 1005  ° MCV 89.5 05/16/2019 1005  ° MCH 31.1 05/16/2019 1005  ° MCHC 34.8 05/16/2019 1005  ° RDW 12.2 05/16/2019 1005  ° LYMPHSABS 2.0 05/16/2019 1005  ° MONOABS 0.7 05/16/2019 1005  °  EOSABS 0.4 05/16/2019 1005  ° BASOSABS 0.0 05/16/2019 1005  ° °CMP Latest Ref Rng & Units 04/15/2016 04/12/2016 05/28/2014  °Glucose 65 - 99 mg/dL 157(H) 152(H) 159(H)  °BUN 6 - 20 mg/dL 19 15 12  °Creatinine 0.61 - 1.24 mg/dL 0.95 0.74 0.71  °Sodium 135 - 145 mmol/L 133(L) 133(L) 138  °Potassium 3.5 - 5.1 mmol/L 3.9 3.6 3.8  °Chloride 101 - 111 mmol/L 100(L) 105 101  °CO2 22 - 32 mmol/L 26 22 23  °Calcium 8.9 - 10.3 mg/dL 8.1(L) 8.7(L) 8.9  ° ° ° ° ° ° °ASSESSMENT & PLAN:  ° °THROMBOCYTOPENIA °1.  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 °- He was seen by Dr. Karb in [place] and had a bone marrow biopsy done in 2009 which was reportedly normal. °-CT scan of the abdomen and pelvis at that time showed fatty liver with normal spleen. °- He had recent CBC done at Dr. Hall's office on 11/14/2018 which showed platelet count of 51.  White count and hemoglobin were within normal limits. °-He has some easy bruising on the upper extremities.  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 °-No family history of connective tissue disorders or malignancies. °- Peripheral smear did not reveal any clumping was seen.  Extensive work-up for connective tissue disorders were negative.  Infectious work-up for H. pylori and hepatitis panel was negative.  LDH was normal.  SPEP was normal.  Nutrition work-up for B12, folic acid and copper were normal. 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  We will initiate therapy once platelet count is less than 30.  °- CBC today revealed a platelet count of 56,000.  Patient has no active bleeding.  We will continue   to monitor platelet count. °-Return to clinic in 6 months or sooner if needed. ° ° °  ° °Orders placed this encounter:  °Orders Placed This Encounter  °Procedures  °• CBC with Differential  °• Lactate dehydrogenase  ° ° ° ° ° R , FNP °[facility] °[phone] ° °

## 2019-05-23 NOTE — Patient Instructions (Signed)
[facility] at [facility]  Discharge Instructions:  You saw Renee Nester, NP, today. _______________________________________________________________  Thank you for choosing [facility] at [facility] to provide your oncology and hematology care.  To afford each patient quality time with our providers, please arrive at least 15 minutes before your scheduled appointment.  You need to re-schedule your appointment if you arrive 10 or more minutes late.  We strive to give you quality time with our providers, and arriving late affects you and other patients whose appointments are after yours.  Also, if you no show three or more times for appointments you may be dismissed from the clinic.  Again, thank you for choosing [facility] at [facility]. Our hope is that these requests will allow you access to exceptional care and in a timely manner. _______________________________________________________________  If you have questions after your visit, please contact our office at [phone] between the hours of 8:30 a.m. and 5:00 p.m. Voicemails left after 4:30 p.m. will not be returned until the following business day. _______________________________________________________________  For prescription refill requests, have your pharmacy contact our office. _______________________________________________________________  Recommendations made by the consultant and any test results will be sent to your referring physician. _______________________________________________________________ 

## 2019-06-19 DIAGNOSIS — G9009 Other idiopathic peripheral autonomic neuropathy: Secondary | ICD-10-CM | POA: Diagnosis not present

## 2019-06-19 DIAGNOSIS — D696 Thrombocytopenia, unspecified: Secondary | ICD-10-CM | POA: Diagnosis not present

## 2019-06-19 DIAGNOSIS — E782 Mixed hyperlipidemia: Secondary | ICD-10-CM | POA: Diagnosis not present

## 2019-06-19 DIAGNOSIS — E1122 Type 2 diabetes mellitus with diabetic chronic kidney disease: Secondary | ICD-10-CM | POA: Diagnosis not present

## 2019-06-19 DIAGNOSIS — R809 Proteinuria, unspecified: Secondary | ICD-10-CM | POA: Diagnosis not present

## 2019-06-19 DIAGNOSIS — I129 Hypertensive chronic kidney disease with stage 1 through stage 4 chronic kidney disease, or unspecified chronic kidney disease: Secondary | ICD-10-CM | POA: Diagnosis not present

## 2019-06-19 DIAGNOSIS — E1140 Type 2 diabetes mellitus with diabetic neuropathy, unspecified: Secondary | ICD-10-CM | POA: Diagnosis not present

## 2019-06-19 DIAGNOSIS — E1165 Type 2 diabetes mellitus with hyperglycemia: Secondary | ICD-10-CM | POA: Diagnosis not present

## 2019-06-19 DIAGNOSIS — N182 Chronic kidney disease, stage 2 (mild): Secondary | ICD-10-CM | POA: Diagnosis not present

## 2019-07-13 DIAGNOSIS — R809 Proteinuria, unspecified: Secondary | ICD-10-CM | POA: Diagnosis not present

## 2019-07-13 DIAGNOSIS — E1140 Type 2 diabetes mellitus with diabetic neuropathy, unspecified: Secondary | ICD-10-CM | POA: Diagnosis not present

## 2019-07-13 DIAGNOSIS — G9009 Other idiopathic peripheral autonomic neuropathy: Secondary | ICD-10-CM | POA: Diagnosis not present

## 2019-07-13 DIAGNOSIS — E1122 Type 2 diabetes mellitus with diabetic chronic kidney disease: Secondary | ICD-10-CM | POA: Diagnosis not present

## 2019-07-13 DIAGNOSIS — E1165 Type 2 diabetes mellitus with hyperglycemia: Secondary | ICD-10-CM | POA: Diagnosis not present

## 2019-07-13 DIAGNOSIS — E782 Mixed hyperlipidemia: Secondary | ICD-10-CM | POA: Diagnosis not present

## 2019-07-13 DIAGNOSIS — I129 Hypertensive chronic kidney disease with stage 1 through stage 4 chronic kidney disease, or unspecified chronic kidney disease: Secondary | ICD-10-CM | POA: Diagnosis not present

## 2019-07-13 DIAGNOSIS — N182 Chronic kidney disease, stage 2 (mild): Secondary | ICD-10-CM | POA: Diagnosis not present

## 2019-07-13 DIAGNOSIS — D696 Thrombocytopenia, unspecified: Secondary | ICD-10-CM | POA: Diagnosis not present

## 2019-08-01 DIAGNOSIS — G9009 Other idiopathic peripheral autonomic neuropathy: Secondary | ICD-10-CM | POA: Diagnosis not present

## 2019-08-01 DIAGNOSIS — H9201 Otalgia, right ear: Secondary | ICD-10-CM | POA: Diagnosis not present

## 2019-08-15 DIAGNOSIS — I10 Essential (primary) hypertension: Secondary | ICD-10-CM | POA: Diagnosis not present

## 2019-08-15 DIAGNOSIS — E1122 Type 2 diabetes mellitus with diabetic chronic kidney disease: Secondary | ICD-10-CM | POA: Diagnosis not present

## 2019-08-15 DIAGNOSIS — E1140 Type 2 diabetes mellitus with diabetic neuropathy, unspecified: Secondary | ICD-10-CM | POA: Diagnosis not present

## 2019-08-15 DIAGNOSIS — N182 Chronic kidney disease, stage 2 (mild): Secondary | ICD-10-CM | POA: Diagnosis not present

## 2019-08-15 DIAGNOSIS — Z125 Encounter for screening for malignant neoplasm of prostate: Secondary | ICD-10-CM | POA: Diagnosis not present

## 2019-08-15 DIAGNOSIS — E782 Mixed hyperlipidemia: Secondary | ICD-10-CM | POA: Diagnosis not present

## 2019-08-15 DIAGNOSIS — E1165 Type 2 diabetes mellitus with hyperglycemia: Secondary | ICD-10-CM | POA: Diagnosis not present

## 2019-08-15 DIAGNOSIS — E1129 Type 2 diabetes mellitus with other diabetic kidney complication: Secondary | ICD-10-CM | POA: Diagnosis not present

## 2019-08-20 DIAGNOSIS — Z23 Encounter for immunization: Secondary | ICD-10-CM | POA: Diagnosis not present

## 2019-08-21 DIAGNOSIS — G9009 Other idiopathic peripheral autonomic neuropathy: Secondary | ICD-10-CM | POA: Diagnosis not present

## 2019-08-21 DIAGNOSIS — E1140 Type 2 diabetes mellitus with diabetic neuropathy, unspecified: Secondary | ICD-10-CM | POA: Diagnosis not present

## 2019-08-21 DIAGNOSIS — I10 Essential (primary) hypertension: Secondary | ICD-10-CM | POA: Diagnosis not present

## 2019-08-21 DIAGNOSIS — G4733 Obstructive sleep apnea (adult) (pediatric): Secondary | ICD-10-CM | POA: Diagnosis not present

## 2019-08-21 DIAGNOSIS — E1165 Type 2 diabetes mellitus with hyperglycemia: Secondary | ICD-10-CM | POA: Diagnosis not present

## 2019-08-21 DIAGNOSIS — E782 Mixed hyperlipidemia: Secondary | ICD-10-CM | POA: Diagnosis not present

## 2019-08-21 DIAGNOSIS — D696 Thrombocytopenia, unspecified: Secondary | ICD-10-CM | POA: Diagnosis not present

## 2019-08-22 DIAGNOSIS — Z23 Encounter for immunization: Secondary | ICD-10-CM | POA: Diagnosis not present

## 2019-09-18 DIAGNOSIS — E1140 Type 2 diabetes mellitus with diabetic neuropathy, unspecified: Secondary | ICD-10-CM | POA: Diagnosis not present

## 2019-09-18 DIAGNOSIS — E1165 Type 2 diabetes mellitus with hyperglycemia: Secondary | ICD-10-CM | POA: Diagnosis not present

## 2019-09-18 DIAGNOSIS — E782 Mixed hyperlipidemia: Secondary | ICD-10-CM | POA: Diagnosis not present

## 2019-09-18 DIAGNOSIS — Z0001 Encounter for general adult medical examination with abnormal findings: Secondary | ICD-10-CM | POA: Diagnosis not present

## 2019-09-18 DIAGNOSIS — G9009 Other idiopathic peripheral autonomic neuropathy: Secondary | ICD-10-CM | POA: Diagnosis not present

## 2019-09-18 DIAGNOSIS — G4733 Obstructive sleep apnea (adult) (pediatric): Secondary | ICD-10-CM | POA: Diagnosis not present

## 2019-09-18 DIAGNOSIS — H6121 Impacted cerumen, right ear: Secondary | ICD-10-CM | POA: Diagnosis not present

## 2019-09-18 DIAGNOSIS — I10 Essential (primary) hypertension: Secondary | ICD-10-CM | POA: Diagnosis not present

## 2019-09-18 DIAGNOSIS — D696 Thrombocytopenia, unspecified: Secondary | ICD-10-CM | POA: Diagnosis not present

## 2019-09-18 DIAGNOSIS — R809 Proteinuria, unspecified: Secondary | ICD-10-CM | POA: Diagnosis not present

## 2019-09-18 DIAGNOSIS — R5383 Other fatigue: Secondary | ICD-10-CM | POA: Diagnosis not present

## 2019-11-06 DIAGNOSIS — H6121 Impacted cerumen, right ear: Secondary | ICD-10-CM | POA: Diagnosis not present

## 2019-11-06 DIAGNOSIS — G4733 Obstructive sleep apnea (adult) (pediatric): Secondary | ICD-10-CM | POA: Diagnosis not present

## 2019-11-06 DIAGNOSIS — D696 Thrombocytopenia, unspecified: Secondary | ICD-10-CM | POA: Diagnosis not present

## 2019-11-06 DIAGNOSIS — E1140 Type 2 diabetes mellitus with diabetic neuropathy, unspecified: Secondary | ICD-10-CM | POA: Diagnosis not present

## 2019-11-06 DIAGNOSIS — E782 Mixed hyperlipidemia: Secondary | ICD-10-CM | POA: Diagnosis not present

## 2019-11-06 DIAGNOSIS — E1165 Type 2 diabetes mellitus with hyperglycemia: Secondary | ICD-10-CM | POA: Diagnosis not present

## 2019-11-06 DIAGNOSIS — Z0001 Encounter for general adult medical examination with abnormal findings: Secondary | ICD-10-CM | POA: Diagnosis not present

## 2019-11-06 DIAGNOSIS — R5383 Other fatigue: Secondary | ICD-10-CM | POA: Diagnosis not present

## 2019-11-06 DIAGNOSIS — G9009 Other idiopathic peripheral autonomic neuropathy: Secondary | ICD-10-CM | POA: Diagnosis not present

## 2019-11-06 DIAGNOSIS — R809 Proteinuria, unspecified: Secondary | ICD-10-CM | POA: Diagnosis not present

## 2019-11-06 DIAGNOSIS — I10 Essential (primary) hypertension: Secondary | ICD-10-CM | POA: Diagnosis not present

## 2019-11-09 DIAGNOSIS — Z23 Encounter for immunization: Secondary | ICD-10-CM | POA: Diagnosis not present

## 2019-11-27 ENCOUNTER — Other Ambulatory Visit (HOSPITAL_COMMUNITY): Payer: Self-pay | Admitting: *Deleted

## 2019-11-27 DIAGNOSIS — D696 Thrombocytopenia, unspecified: Secondary | ICD-10-CM

## 2019-11-28 ENCOUNTER — Other Ambulatory Visit: Payer: Self-pay

## 2019-11-28 ENCOUNTER — Inpatient Hospital Stay (HOSPITAL_COMMUNITY): Payer: Medicare Other | Attending: Hematology

## 2019-11-28 DIAGNOSIS — R42 Dizziness and giddiness: Secondary | ICD-10-CM | POA: Diagnosis not present

## 2019-11-28 DIAGNOSIS — Z7984 Long term (current) use of oral hypoglycemic drugs: Secondary | ICD-10-CM | POA: Diagnosis not present

## 2019-11-28 DIAGNOSIS — E119 Type 2 diabetes mellitus without complications: Secondary | ICD-10-CM | POA: Insufficient documentation

## 2019-11-28 DIAGNOSIS — Z79899 Other long term (current) drug therapy: Secondary | ICD-10-CM | POA: Insufficient documentation

## 2019-11-28 DIAGNOSIS — M129 Arthropathy, unspecified: Secondary | ICD-10-CM | POA: Diagnosis not present

## 2019-11-28 DIAGNOSIS — D696 Thrombocytopenia, unspecified: Secondary | ICD-10-CM | POA: Diagnosis not present

## 2019-11-28 DIAGNOSIS — G4730 Sleep apnea, unspecified: Secondary | ICD-10-CM | POA: Diagnosis not present

## 2019-11-28 DIAGNOSIS — E785 Hyperlipidemia, unspecified: Secondary | ICD-10-CM | POA: Diagnosis not present

## 2019-11-28 DIAGNOSIS — E669 Obesity, unspecified: Secondary | ICD-10-CM | POA: Diagnosis not present

## 2019-11-28 LAB — COMPREHENSIVE METABOLIC PANEL
ALT: 23 U/L (ref 0–44)
AST: 17 U/L (ref 15–41)
Albumin: 4.1 g/dL (ref 3.5–5.0)
Alkaline Phosphatase: 53 U/L (ref 38–126)
Anion gap: 10 (ref 5–15)
BUN: 18 mg/dL (ref 8–23)
CO2: 22 mmol/L (ref 22–32)
Calcium: 8.9 mg/dL (ref 8.9–10.3)
Chloride: 104 mmol/L (ref 98–111)
Creatinine, Ser: 0.9 mg/dL (ref 0.61–1.24)
GFR calc Af Amer: >60 mL/min (ref >60)
GFR calc non Af Amer: >60 mL/min (ref >60)
Glucose, Bld: 171 mg/dL — ABNORMAL HIGH (ref 70–99)
Potassium: 3.6 mmol/L (ref 3.5–5.1)
Sodium: 136 mmol/L (ref 135–145)
Total Bilirubin: 0.9 mg/dL (ref 0.3–1.2)
Total Protein: 7 g/dL (ref 6.5–8.1)

## 2019-11-28 LAB — CBC WITH DIFFERENTIAL/PLATELET
Abs Immature Granulocytes: 0.03 10*3/uL (ref 0.00–0.07)
Basophils Absolute: 0 10*3/uL (ref 0.0–0.1)
Basophils Relative: 1 %
Eosinophils Absolute: 0.3 10*3/uL (ref 0.0–0.5)
Eosinophils Relative: 5 %
HCT: 44.1 % (ref 39.0–52.0)
Hemoglobin: 15.4 g/dL (ref 13.0–17.0)
Immature Granulocytes: 0 %
Lymphocytes Relative: 28 %
Lymphs Abs: 2 10*3/uL (ref 0.7–4.0)
MCH: 31.8 pg (ref 26.0–34.0)
MCHC: 34.9 g/dL (ref 30.0–36.0)
MCV: 91.1 fL (ref 80.0–100.0)
Monocytes Absolute: 0.6 10*3/uL (ref 0.1–1.0)
Monocytes Relative: 8 %
Neutro Abs: 4.2 10*3/uL (ref 1.7–7.7)
Neutrophils Relative %: 58 %
Platelets: 57 10*3/uL — ABNORMAL LOW (ref 150–400)
RBC: 4.84 MIL/uL (ref 4.22–5.81)
RDW: 12.6 % (ref 11.5–15.5)
WBC: 7.2 10*3/uL (ref 4.0–10.5)
nRBC: 0 % (ref 0.0–0.2)

## 2019-11-28 LAB — FOLATE: Folate: 30.9 ng/mL (ref >5.9)

## 2019-11-28 LAB — VITAMIN B12: Vitamin B-12: 315 pg/mL (ref 180–914)

## 2019-11-28 LAB — LACTATE DEHYDROGENASE: LDH: 142 U/L (ref 98–192)

## 2019-12-05 ENCOUNTER — Other Ambulatory Visit: Payer: Self-pay

## 2019-12-05 ENCOUNTER — Encounter (HOSPITAL_COMMUNITY): Payer: Self-pay | Admitting: Hematology

## 2019-12-05 ENCOUNTER — Inpatient Hospital Stay (HOSPITAL_BASED_OUTPATIENT_CLINIC_OR_DEPARTMENT_OTHER): Payer: Medicare Other | Admitting: Hematology

## 2019-12-05 VITALS — BP 161/93 | HR 70 | Temp 97.1°F | Resp 16 | Wt 273.9 lb

## 2019-12-05 DIAGNOSIS — E119 Type 2 diabetes mellitus without complications: Secondary | ICD-10-CM | POA: Diagnosis not present

## 2019-12-05 DIAGNOSIS — E669 Obesity, unspecified: Secondary | ICD-10-CM | POA: Diagnosis not present

## 2019-12-05 DIAGNOSIS — E785 Hyperlipidemia, unspecified: Secondary | ICD-10-CM | POA: Diagnosis not present

## 2019-12-05 DIAGNOSIS — R42 Dizziness and giddiness: Secondary | ICD-10-CM | POA: Diagnosis not present

## 2019-12-05 DIAGNOSIS — M129 Arthropathy, unspecified: Secondary | ICD-10-CM | POA: Diagnosis not present

## 2019-12-05 DIAGNOSIS — D696 Thrombocytopenia, unspecified: Secondary | ICD-10-CM

## 2019-12-05 NOTE — Patient Instructions (Addendum)
[place] at [facility] Discharge Instructions  You were seen today by Dr. Delton Coombes. He went over your recent test results. He will see you back in 6 months for labs and follow up.   Thank you for choosing [place] at [facility] to provide your oncology and hematology care.  To afford each patient quality time with our provider, please arrive at least 15 minutes before your scheduled appointment time.   If you have a lab appointment with the [place] please come in thru the  Main Entrance and check in at the main information desk  You need to re-schedule your appointment should you arrive 10 or more minutes late.  We strive to give you quality time with our providers, and arriving late affects you and other patients whose appointments are after yours.  Also, if you no show three or more times for appointments you may be dismissed from the clinic at the providers discretion.     Again, thank you for choosing [facility].  Our hope is that these requests will decrease the amount of time that you wait before being seen by our physicians.       _____________________________________________________________  Should you have questions after your visit to [facility], please contact our office at [phone] between the hours of 8:00 a.m. and 4:30 p.m.  Voicemails left after 4:00 p.m. will not be returned until the following business day.  For prescription refill requests, have your pharmacy contact our office and allow 72 hours.    Cancer Center Support Programs:   > Cancer Support Group  2nd Tuesday of the month 1pm-2pm, Journey Room

## 2019-12-05 NOTE — Progress Notes (Signed)
Brian [name],  [id number]   CLINIC:  Medical Oncology/Hematology  PCP:  Celene Squibb, MD [place] [address] [phone]   REASON FOR VISIT:  Follow-up for thrombocytopenia.  CURRENT THERAPY: Observation.  BRIEF ONCOLOGIC HISTORY:  Oncology History   No history exists.     CANCER STAGING: Cancer Staging No matching staging information was found for the patient.   INTERVAL HISTORY:  Brian [name] 79 y.o. male seen for follow-up of moderate thrombocytopenia.  He has thrombocytopenia since 2000.  Appetite is 100%.  Energy levels are 50%.  Easy bruising on upper extremities present.  Occasional dizziness secondary to balance issues is also chronic.  No new onset pains.  No recurrent infections or hospitalizations.    REVIEW OF SYSTEMS:  Review of Systems  Neurological: Positive for dizziness.  Hematological: Bruises/bleeds easily.  All other systems reviewed and are negative.    PAST MEDICAL/SURGICAL HISTORY:  Past Medical History:  Diagnosis Date  . Arthritis   . Atypical chest pain    Low-risk exercise stress Cardiolite EF 60% April 2008. Reportedly normal remote cardiac catheterization @ [facility] EF 60%...nuclear....2008  . Bradycardia    hospital 03/2010 stopped beta blocker  . Complication of anesthesia   . Diabetes mellitus without complication ([place])   . Dyslipidemia   . Hypertension   . Mild aortic regurgitation with LV dilation by prior echocardiogram   . Obesity   . Obstructive sleep apnea    C-Pap compliant  . PONV (postoperative nausea and vomiting)   . Thrombocytopenia (HCC)    mild follow Dr. Alonna Minium  . Vertigo    Hospital 03/2010   Past Surgical History:  Procedure Laterality Date  . CARDIAC CATHETERIZATION    . KNEE ARTHROSCOPY WITH MEDIAL MENISECTOMY Left 05/31/2014   Procedure: LEFT KNEE ARTHROSCOPY WITH MEDIAL MENISECTOMY;  Surgeon: Carole Civil, MD;  Location: [facility];  Service:  Orthopedics;  Laterality: Left;  . Status post bilateral arthroscopic knee surgery    . 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 Marital status: Married    Spouse name: Not on file  . Number of children: 3  . Years of education: Not on file  . Highest education level: Not on file  Occupational History  . Not on file  Tobacco Use  . Smoking status: Never Smoker  . Smokeless tobacco: Never Used  Substance and Sexual Activity  . Alcohol use: Yes    Comment: wine on occasion  . Drug use: No  . Sexual activity: Not on file  Other Topics Concern  . Not on file  Social History Narrative  . Not on file   Social Determinants of Health   Financial Resource Strain:   . Difficulty of Paying Living Expenses: Not on file  Food Insecurity:   . Worried About Charity fundraiser in the Last Year: Not on file  . Ran Out of Food in the Last Year: Not on file  Transportation Needs:   . Lack of Transportation (Medical): Not on file  . Lack of Transportation (Non-Medical): Not on file  Physical Activity:   . Days of Exercise per Week: Not on file  . Minutes of Exercise per Session: Not on file  Stress:   .  Feeling of Stress : Not on file  Social Connections:   . Frequency of Communication with Friends and Family: Not on file  . Frequency of Social Gatherings with Friends and Family: Not on file  . Attends Religious Services: Not on file  . Active Member of Clubs or Organizations: Not on file  . Attends Archivist Meetings: Not on file  . Marital Status: Not on file  Intimate Partner Violence:   . Fear of Current or Ex-Partner: Not on file  . Emotionally Abused: Not on file  . Physically Abused: Not on file  . Sexually Abused: Not on file    FAMILY HISTORY:  Family History   Problem Relation Age of Onset  . Heart attack Mother        fatal, cause of death  . Heart attack Maternal Grandmother     CURRENT MEDICATIONS:  Outpatient Encounter Medications as of 12/05/2019  Medication Sig  . Melatonin 3 MG TABS Take by mouth at bedtime.  Marland Kitchen amLODipine (NORVASC) 10 MG tablet Take 10 mg by mouth daily.   Marland Kitchen aspirin-acetaminophen-caffeine (EXCEDRIN MIGRAINE) 250-250-65 MG tablet Take 1 tablet by mouth every 6 (six) hours as needed for headache.  . B Complex Vitamins (VITAMIN B-COMPLEX) TABS Take 1 tablet by mouth daily.   . fluticasone (FLONASE) 50 MCG/ACT nasal spray Place 1 spray into both nostrils daily.   Marland Kitchen gabapentin (NEURONTIN) 600 MG tablet Take 1,200 mg by mouth daily.   Marland Kitchen glipiZIDE (GLUCOTROL XL) 5 MG 24 hr tablet Take 5 mg by mouth 2 (two) times daily.   . Glucosamine-Chondroit-Vit C-Mn (GLUCOSAMINE 1500 COMPLEX PO) Take 1 tablet by mouth daily.   . metFORMIN (GLUCOPHAGE) 500 MG tablet Take 500 mg by mouth 4 (four) times daily.   . mupirocin ointment (BACTROBAN) 2 % Apply 1 application topically 3 times/day as needed-between meals & bedtime.   Marland Kitchen olmesartan (BENICAR) 40 MG tablet Take 40 mg by mouth daily.   Marland Kitchen triamcinolone cream (KENALOG) 0.5 % Apply 1 application topically as needed.   . [DISCONTINUED] gabapentin (NEURONTIN) 100 MG capsule   . [DISCONTINUED] glipiZIDE (GLUCOTROL) 5 MG tablet    No facility-administered encounter medications on file as of 12/05/2019.    ALLERGIES:  Allergies  Allergen Reactions  . Penicillins Other (See Comments)    DIZZINESS Has patient had a PCN reaction causing immediate rash, facial/tongue/throat swelling, SOB or lightheadedness with hypotension: No Has patient had a PCN reaction causing severe rash involving mucus membranes or skin necrosis: No Has patient had a PCN reaction that required hospitalization No Has patient had a PCN reaction occurring within the last 10 years: No If all of the above answers are "NO",  then may proceed with Cephalosporin use.  DIZZINESS Has patient had a PCN reaction causing immediate rash, facial/tongue/throat swelling, SOB or lightheadedness with hypotension: No Has patient had a PCN reaction causing severe rash involving mucus membranes or skin necrosis: No Has patient had a PCN reaction that required hospitalization No Has patient had a PCN reaction occurring within the last 10 years: No If all of the above answers are "NO", then may proceed with Cephalosporin use.     PHYSICAL EXAM:  ECOG Performance status: 1  Vitals:   12/05/19 1137  BP: (!) 161/93  Pulse: 70  Resp: 16  Temp: (!) 97.1 F (36.2 C)  SpO2: 98%   Filed Weights   12/05/19 1137  Weight: 273 lb 14.4 oz (124.2 kg)    Physical Exam  Vitals reviewed.  Constitutional:      Appearance: Normal appearance.  Cardiovascular:     Rate and Rhythm: Normal rate.     Heart sounds: Normal heart sounds.  Pulmonary:     Effort: Pulmonary effort is normal.     Breath sounds: Normal breath sounds.  Abdominal:     General: There is no distension.     Palpations: Abdomen is soft. There is no mass.  Lymphadenopathy:     Cervical: No cervical adenopathy.  Skin:    Findings: Bruising present.  Neurological:     Mental Status: He is alert and oriented to person, place, and time.  Psychiatric:        Mood and Affect: Mood normal.        Behavior: Behavior normal.      LABORATORY DATA:  I have reviewed the labs as listed.  CBC    Component Value Date/Time   WBC 7.2 11/28/2019 1108   RBC 4.84 11/28/2019 1108   HGB 15.4 11/28/2019 1108   HCT 44.1 11/28/2019 1108   PLT 57 (L) 11/28/2019 1108   MCV 91.1 11/28/2019 1108   MCH 31.8 11/28/2019 1108   MCHC 34.9 11/28/2019 1108   RDW 12.6 11/28/2019 1108   LYMPHSABS 2.0 11/28/2019 1108   MONOABS 0.6 11/28/2019 1108   EOSABS 0.3 11/28/2019 1108   BASOSABS 0.0 11/28/2019 1108   CMP Latest Ref Rng & Units 11/28/2019 04/15/2016 04/12/2016  Glucose 70 -  99 mg/dL 171(H) 157(H) 152(H)  BUN 8 - 23 mg/dL 18 19 15   Creatinine 0.61 - 1.24 mg/dL 0.90 0.95 0.74  Sodium 135 - 145 mmol/L 136 133(L) 133(L)  Potassium 3.5 - 5.1 mmol/L 3.6 3.9 3.6  Chloride 98 - 111 mmol/L 104 100(L) 105  CO2 22 - 32 mmol/L 22 26 22   Calcium 8.9 - 10.3 mg/dL 8.9 8.1(L) 8.7(L)  Total Protein 6.5 - 8.1 g/dL 7.0 - -  Total Bilirubin 0.3 - 1.2 mg/dL 0.9 - -  Alkaline Phos 38 - 126 U/L 53 - -  AST 15 - 41 U/L 17 - -  ALT 0 - 44 U/L 23 - -       DIAGNOSTIC IMAGING:  I have independently reviewed the scans and discussed with the patient.   ASSESSMENT & PLAN:   THROMBOCYTOPENIA 1.  Moderate thrombocytopenia: -History of mild to moderate thrombocytopenia since 2000.  Bone marrow biopsy in 2009 was reportedly normal.  This was done in [place]. -CTAP at that time showed fatty infiltration with normal spleen. -Nutritional deficiency work-up was negative.  SPEP was normal.  LDH was normal.  Infectious etiology was also negative. -He reports easy bruising on the upper extremities.  Denies any nosebleeds, hematuria or bleeding per rectum. -His platelet count has been staying stable between 50-60. -I reviewed recent labs which showed white count 7.2, normal hemoglobin 15.4 and platelet count 57.  B12 was 315 and LDH was 142. -No palpable splenomegaly.  No B symptoms. -We will continue to monitor at 6 monthly intervals.  He was told to come back sooner should he develop any bleeding.      Orders placed this encounter:  Orders Placed This Encounter  Procedures  . CBC with Differential      Derek Jack, MD [place] [phone]

## 2019-12-07 DIAGNOSIS — Z23 Encounter for immunization: Secondary | ICD-10-CM | POA: Diagnosis not present

## 2019-12-08 NOTE — Assessment & Plan Note (Signed)
1.  Moderate thrombocytopenia: -History of mild to moderate thrombocytopenia since 2000.  Bone marrow biopsy in 2009 was reportedly normal.  This was done in [place]. -CTAP at that time showed fatty infiltration with normal spleen. -Nutritional deficiency work-up was negative.  SPEP was normal.  LDH was normal.  Infectious etiology was also negative. -He reports easy bruising on the upper extremities.  Denies any nosebleeds, hematuria or bleeding per rectum. -His platelet count has been staying stable between 50-60. -I reviewed recent labs which showed white count 7.2, normal hemoglobin 15.4 and platelet count 57.  B12 was 315 and LDH was 142. -No palpable splenomegaly.  No B symptoms. -We will continue to monitor at 6 monthly intervals.  He was told to come back sooner should he develop any bleeding.

## 2019-12-10 DIAGNOSIS — M542 Cervicalgia: Secondary | ICD-10-CM | POA: Diagnosis not present

## 2019-12-10 DIAGNOSIS — H9201 Otalgia, right ear: Secondary | ICD-10-CM | POA: Diagnosis not present

## 2019-12-28 DIAGNOSIS — H9201 Otalgia, right ear: Secondary | ICD-10-CM | POA: Diagnosis not present

## 2019-12-28 DIAGNOSIS — Z96651 Presence of right artificial knee joint: Secondary | ICD-10-CM | POA: Diagnosis not present

## 2019-12-28 DIAGNOSIS — N401 Enlarged prostate with lower urinary tract symptoms: Secondary | ICD-10-CM | POA: Diagnosis not present

## 2019-12-28 DIAGNOSIS — R21 Rash and other nonspecific skin eruption: Secondary | ICD-10-CM | POA: Diagnosis not present

## 2019-12-28 DIAGNOSIS — L723 Sebaceous cyst: Secondary | ICD-10-CM | POA: Diagnosis not present

## 2019-12-28 DIAGNOSIS — H6121 Impacted cerumen, right ear: Secondary | ICD-10-CM | POA: Diagnosis not present

## 2019-12-28 DIAGNOSIS — M542 Cervicalgia: Secondary | ICD-10-CM | POA: Diagnosis not present

## 2019-12-28 DIAGNOSIS — N529 Male erectile dysfunction, unspecified: Secondary | ICD-10-CM | POA: Diagnosis not present

## 2019-12-28 DIAGNOSIS — Z6836 Body mass index (BMI) 36.0-36.9, adult: Secondary | ICD-10-CM | POA: Diagnosis not present

## 2019-12-28 DIAGNOSIS — G629 Polyneuropathy, unspecified: Secondary | ICD-10-CM | POA: Diagnosis not present

## 2019-12-28 DIAGNOSIS — G4733 Obstructive sleep apnea (adult) (pediatric): Secondary | ICD-10-CM | POA: Diagnosis not present

## 2019-12-28 DIAGNOSIS — H8110 Benign paroxysmal vertigo, unspecified ear: Secondary | ICD-10-CM | POA: Diagnosis not present

## 2020-01-01 DIAGNOSIS — R809 Proteinuria, unspecified: Secondary | ICD-10-CM | POA: Diagnosis not present

## 2020-01-01 DIAGNOSIS — G9009 Other idiopathic peripheral autonomic neuropathy: Secondary | ICD-10-CM | POA: Diagnosis not present

## 2020-01-01 DIAGNOSIS — Z6837 Body mass index (BMI) 37.0-37.9, adult: Secondary | ICD-10-CM | POA: Diagnosis not present

## 2020-01-01 DIAGNOSIS — D696 Thrombocytopenia, unspecified: Secondary | ICD-10-CM | POA: Diagnosis not present

## 2020-01-01 DIAGNOSIS — N182 Chronic kidney disease, stage 2 (mild): Secondary | ICD-10-CM | POA: Diagnosis not present

## 2020-01-01 DIAGNOSIS — E1140 Type 2 diabetes mellitus with diabetic neuropathy, unspecified: Secondary | ICD-10-CM | POA: Diagnosis not present

## 2020-01-01 DIAGNOSIS — L720 Epidermal cyst: Secondary | ICD-10-CM | POA: Diagnosis not present

## 2020-01-01 DIAGNOSIS — E1122 Type 2 diabetes mellitus with diabetic chronic kidney disease: Secondary | ICD-10-CM | POA: Diagnosis not present

## 2020-01-01 DIAGNOSIS — E782 Mixed hyperlipidemia: Secondary | ICD-10-CM | POA: Diagnosis not present

## 2020-01-01 DIAGNOSIS — Z0001 Encounter for general adult medical examination with abnormal findings: Secondary | ICD-10-CM | POA: Diagnosis not present

## 2020-01-01 DIAGNOSIS — I129 Hypertensive chronic kidney disease with stage 1 through stage 4 chronic kidney disease, or unspecified chronic kidney disease: Secondary | ICD-10-CM | POA: Diagnosis not present

## 2020-01-01 DIAGNOSIS — E1165 Type 2 diabetes mellitus with hyperglycemia: Secondary | ICD-10-CM | POA: Diagnosis not present

## 2020-01-01 DIAGNOSIS — G4733 Obstructive sleep apnea (adult) (pediatric): Secondary | ICD-10-CM | POA: Diagnosis not present

## 2020-01-26 ENCOUNTER — Emergency Department (HOSPITAL_COMMUNITY)
Admission: EM | Admit: 2020-01-26 | Discharge: 2020-01-26 | Disposition: A | Discharge status: Home / Self Care | Payer: Medicare Other | Attending: Emergency Medicine | Admitting: Emergency Medicine

## 2020-01-26 ENCOUNTER — Other Ambulatory Visit: Payer: Self-pay

## 2020-01-26 ENCOUNTER — Ambulatory Visit
Admission: EM | Admit: 2020-01-26 | Discharge: 2020-01-26 | Disposition: A | Discharge status: Home / Self Care | Payer: Medicare Other | Source: Home / Self Care

## 2020-01-26 ENCOUNTER — Emergency Department (HOSPITAL_COMMUNITY): Payer: Medicare Other

## 2020-01-26 ENCOUNTER — Encounter (HOSPITAL_COMMUNITY): Payer: Self-pay | Admitting: Emergency Medicine

## 2020-01-26 DIAGNOSIS — Z79899 Other long term (current) drug therapy: Secondary | ICD-10-CM | POA: Insufficient documentation

## 2020-01-26 DIAGNOSIS — I10 Essential (primary) hypertension: Secondary | ICD-10-CM | POA: Insufficient documentation

## 2020-01-26 DIAGNOSIS — R42 Dizziness and giddiness: Secondary | ICD-10-CM | POA: Diagnosis not present

## 2020-01-26 DIAGNOSIS — Z7984 Long term (current) use of oral hypoglycemic drugs: Secondary | ICD-10-CM | POA: Diagnosis not present

## 2020-01-26 DIAGNOSIS — E119 Type 2 diabetes mellitus without complications: Secondary | ICD-10-CM | POA: Insufficient documentation

## 2020-01-26 DIAGNOSIS — D696 Thrombocytopenia, unspecified: Secondary | ICD-10-CM | POA: Insufficient documentation

## 2020-01-26 LAB — CBC WITH DIFFERENTIAL/PLATELET
Abs Immature Granulocytes: 0.03 10*3/uL (ref 0.00–0.07)
Basophils Absolute: 0 10*3/uL (ref 0.0–0.1)
Basophils Relative: 1 %
Eosinophils Absolute: 0.3 10*3/uL (ref 0.0–0.5)
Eosinophils Relative: 4 %
HCT: 44.7 % (ref 39.0–52.0)
Hemoglobin: 15.7 g/dL (ref 13.0–17.0)
Immature Granulocytes: 0 %
Lymphocytes Relative: 28 %
Lymphs Abs: 2.1 10*3/uL (ref 0.7–4.0)
MCH: 32.3 pg (ref 26.0–34.0)
MCHC: 35.1 g/dL (ref 30.0–36.0)
MCV: 92 fL (ref 80.0–100.0)
Monocytes Absolute: 0.5 10*3/uL (ref 0.1–1.0)
Monocytes Relative: 7 %
Neutro Abs: 4.4 10*3/uL (ref 1.7–7.7)
Neutrophils Relative %: 60 %
Platelets: 29 10*3/uL — CL (ref 150–400)
RBC: 4.86 MIL/uL (ref 4.22–5.81)
RDW: 12.6 % (ref 11.5–15.5)
WBC: 7.4 10*3/uL (ref 4.0–10.5)
nRBC: 0 % (ref 0.0–0.2)

## 2020-01-26 LAB — URINALYSIS, ROUTINE W REFLEX MICROSCOPIC
Bacteria, UA: NONE SEEN
Bilirubin Urine: NEGATIVE
Glucose, UA: 50 mg/dL — AB
Hgb urine dipstick: NEGATIVE
Ketones, ur: NEGATIVE mg/dL
Leukocytes,Ua: NEGATIVE
Nitrite: NEGATIVE
Protein, ur: 100 mg/dL — AB
Specific Gravity, Urine: 1.012 (ref 1.005–1.030)
pH: 6 (ref 5.0–8.0)

## 2020-01-26 LAB — BASIC METABOLIC PANEL
Anion gap: 11 (ref 5–15)
BUN: 22 mg/dL (ref 8–23)
CO2: 23 mmol/L (ref 22–32)
Calcium: 9.2 mg/dL (ref 8.9–10.3)
Chloride: 100 mmol/L (ref 98–111)
Creatinine, Ser: 1.06 mg/dL (ref 0.61–1.24)
GFR calc Af Amer: >60 mL/min (ref >60)
GFR calc non Af Amer: >60 mL/min (ref >60)
Glucose, Bld: 218 mg/dL — ABNORMAL HIGH (ref 70–99)
Potassium: 3.6 mmol/L (ref 3.5–5.1)
Sodium: 134 mmol/L — ABNORMAL LOW (ref 135–145)

## 2020-01-26 LAB — TROPONIN I (HIGH SENSITIVITY)
Troponin I (High Sensitivity): 5 ng/L (ref <18)
Troponin I (High Sensitivity): 5 ng/L (ref <18)

## 2020-01-26 MED ORDER — DEXAMETHASONE 4 MG PO TABS: 40.0000 mg | ORAL_TABLET | Freq: Every day | ORAL | 0 refills | Status: DC

## 2020-01-26 MED ORDER — MECLIZINE HCL 12.5 MG PO TABS
25.0000 mg | ORAL_TABLET | Freq: Once | ORAL | Status: AC
  Administered 2020-01-26: 25 mg via ORAL
  Filled 2020-01-26: qty 2

## 2020-01-26 MED ORDER — DEXAMETHASONE 4 MG PO TABS
40.0000 mg | ORAL_TABLET | Freq: Once | ORAL | Status: DC
  Filled 2020-01-26: qty 10

## 2020-01-26 MED ORDER — DEXAMETHASONE SODIUM PHOSPHATE 10 MG/ML IJ SOLN
10.0000 mg | Freq: Once | INTRAMUSCULAR | Status: AC
  Administered 2020-01-26: 10 mg via INTRAMUSCULAR
  Filled 2020-01-26: qty 1

## 2020-01-26 MED ORDER — MECLIZINE HCL 25 MG PO TABS: 25.0000 mg | ORAL_TABLET | Freq: Three times a day (TID) | ORAL | 0 refills | Status: DC | PRN

## 2020-01-26 NOTE — ED Provider Notes (Signed)
[facility] EMERGENCY DEPARTMENT Provider Note   CSN: [id number] Arrival date & time: 01/26/20  [id number]     History Chief Complaint  Patient presents with  . Dizziness    Brian [name] is a 79 y.o. male.  HPI      Brian [name] is a 79 y.o. male with past medical history significant for hypertension, diabetes, aortic insufficiency and thrombocytopenia ,who presents to the Emergency Department complaining of intermittent episodes of dizziness with position change and rotation of his head.  Symptoms began 3 days ago after doubling up on his atorvastatin.  He originally was started on a 10 mg dose in which he was taking without complication.  He took a 20 mg dose after discussion with his PCP and woke with dizziness symptoms the following morning.  He has since stopped taking the medication completely.  He describes intermittent sensations of "room spinning" and off balance when standing and states he has to hold onto things to walk.  Symptoms improve while he is at rest.  He endorses having similar symptoms in the past, and tried rotating his head right and left which temporarily provided relief.  His symptoms have been associated with nausea, but no vomiting, he also denies chest pain, visual changes, headache, neck pain or stiffness, numbness or weakness of his face or extremities. No syncope, speech changes or confusion.   He also complains of multiple "dark spots" to both forearms that he noticed after removing pieces of metal from a shed.  He was concerned that the dark spots may be related to an infection given that he had several small cuts to his forearms.  He denies pain of his arms and swelling.  Past Medical History:  Diagnosis Date  . Arthritis   . Atypical chest pain    Low-risk exercise stress Cardiolite EF 60% April 2008. Reportedly normal remote cardiac catheterization @ [place] EF 60%...nuclear....2008  . Bradycardia    hospital 03/2010 stopped beta blocker  . Complication  of anesthesia   . Diabetes mellitus without complication ([place])   . Dyslipidemia   . Hypertension   . Mild aortic regurgitation with LV dilation by prior echocardiogram   . Obesity   . Obstructive sleep apnea    C-Pap compliant  . PONV (postoperative nausea and vomiting)   . Thrombocytopenia (HCC)    mild follow Dr. Alonna Minium  . Vertigo    Hospital 03/2010    Patient Active Problem List   Diagnosis Date Noted  . Tendonitis, Achilles, right 08/27/2014  . Stiffness of joint, not elsewhere classified, lower leg 06/11/2014  . Pain in joint, lower leg 06/11/2014  . Abnormality of gait 06/11/2014  . 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 Derangement of posterior horn of medial meniscus 04/09/2014  . OA (osteoarthritis) of knee 04/09/2014  . Knee pain 04/09/2014  . THROMBOCYTOPENIA 05/12/2010  . AORTIC INSUFFICIENCY 05/12/2010  . OBESITY, UNSPECIFIED 05/11/2010  . HYPERTENSION 05/11/2010  . BRADYCARDIA 05/11/2010  . VERTIGO 05/11/2010  . UNSPECIFIED SLEEP APNEA 05/11/2010  . CHEST PAIN UNSPECIFIED 05/11/2010    Past Surgical History:  Procedure Laterality Date  . CARDIAC CATHETERIZATION    . KNEE ARTHROSCOPY WITH MEDIAL MENISECTOMY Left 05/31/2014   Procedure: LEFT KNEE ARTHROSCOPY WITH MEDIAL MENISECTOMY;  Surgeon: Carole Civil, MD;  Location: [facility];  Service: Orthopedics;  Laterality: Left;  . Status post bilateral arthroscopic knee surgery    . TOTAL KNEE ARTHROPLASTY Left 04/14/2016   Procedure: LEFT TOTAL KNEE  ARTHROPLASTY;  Surgeon: Carole Civil, MD;  Location: [facility];  Service: Orthopedics;  Laterality: Left;       Family History  Problem Relation Age of Onset  . Heart attack Mother        fatal, cause of death  . Heart attack Maternal Grandmother     Social History   Tobacco Use  . Smoking status: Never Smoker  . Smokeless tobacco: Never Used  Substance Use Topics  . Alcohol use: Yes    Comment: wine on occasion  . Drug use: No     Home Medications Prior to Admission medications   Medication Sig Start Date End Date Taking? Authorizing Provider  atorvastatin (LIPITOR) 10 MG tablet Take 10 mg by mouth daily.   Yes [provider]  amLODipine (NORVASC) 10 MG tablet Take 10 mg by mouth daily.  05/24/17   [provider]  aspirin-acetaminophen-caffeine (EXCEDRIN MIGRAINE) [phone] MG tablet Take 1 tablet by mouth every 6 (six) hours as needed for headache.    [provider]  B Complex Vitamins (VITAMIN B-COMPLEX) TABS Take 1 tablet by mouth daily.     [provider]  fluticasone (FLONASE) 50 MCG/ACT nasal spray Place 1 spray into both nostrils daily.  11/09/18   [provider]  gabapentin (NEURONTIN) 600 MG tablet Take 1,200 mg by mouth daily.  02/03/18   [provider]  glipiZIDE (GLUCOTROL XL) 5 MG 24 hr tablet Take 5 mg by mouth 2 (two) times daily.     [provider]  Glucosamine-Chondroit-Vit C-Mn (GLUCOSAMINE 1500 COMPLEX PO) Take 1 tablet by mouth daily.     [provider]  Melatonin 3 MG TABS Take by mouth at bedtime.    [provider]  metFORMIN (GLUCOPHAGE) 500 MG tablet Take 500 mg by mouth 4 (four) times daily.     [provider]  mupirocin ointment (BACTROBAN) 2 % Apply 1 application topically 3 times/day as needed-between meals & bedtime.  10/22/19   [provider]  olmesartan (BENICAR) 40 MG tablet Take 40 mg by mouth daily.  01/28/18   [provider]  triamcinolone cream (KENALOG) 0.5 % Apply 1 application topically as needed.  05/17/19   [provider]    Allergies    Penicillins  Review of Systems   Review of Systems  Constitutional: Negative for activity change, appetite change, chills and fever.  HENT: Negative for sinus pressure and trouble swallowing.   Eyes: Negative for visual disturbance.  Respiratory: Negative for chest tightness and shortness of breath.    Cardiovascular: Negative for chest pain and leg swelling.  Gastrointestinal: Positive for nausea. Negative for abdominal distention, abdominal pain, diarrhea and vomiting.  Genitourinary: Negative for decreased urine volume and dysuria.  Musculoskeletal: Negative for arthralgias and back pain.  Skin: Positive for wound. Negative for rash.       Dark spots and abrasions to both forearms.   Neurological: Positive for dizziness. Negative for syncope, facial asymmetry, speech difficulty, weakness, numbness and headaches.  Psychiatric/Behavioral: Negative for confusion. The patient is not nervous/anxious.     Physical Exam Updated Vital Signs BP (!) 179/88   Pulse (!) 59   Temp 98.7 F (37.1 C) (Oral)   Resp 11   Ht 5\' 11"  (1.803 m)   Wt 120.2 kg   SpO2 96%   BMI 36.96 kg/m   Physical Exam Vitals and nursing note reviewed.  Constitutional:      Appearance: Normal appearance. He is  not ill-appearing or toxic-appearing.  HENT:     Head: Atraumatic.     Left Ear: Tympanic membrane and ear canal normal.     Ears:     Comments: Cerumen of the right ear canal, TM unable to be visualized.    Mouth/Throat:     Mouth: Mucous membranes are moist.     Pharynx: Oropharynx is clear.  Eyes:     Extraocular Movements: Extraocular movements intact.     Conjunctiva/sclera: Conjunctivae normal.     Pupils: Pupils are equal, round, and reactive to light.  Cardiovascular:     Rate and Rhythm: Normal rate and regular rhythm.     Pulses: Normal pulses.  Pulmonary:     Effort: Pulmonary effort is normal.     Breath sounds: Normal breath sounds.  Abdominal:     General: There is no distension.     Palpations: Abdomen is soft.     Tenderness: There is no abdominal tenderness.  Musculoskeletal:        General: Normal range of motion.     Cervical back: Normal range of motion. No tenderness.     Right lower leg: No edema.     Left lower leg: No edema.  Lymphadenopathy:     Cervical: No  cervical adenopathy.  Skin:    General: Skin is warm.     Capillary Refill: Capillary refill takes less than 2 seconds.     Findings: Bruising present. No rash.     Comments: Several small areas of bruising noted to bilateral forearms.  No hematomas.   Neurological:     General: No focal deficit present.     Mental Status: He is alert.     GCS: GCS eye subscore is 4. GCS verbal subscore is 5. GCS motor subscore is 6.     Sensory: Sensation is intact. No sensory deficit.     Motor: No weakness or pronator drift.     Coordination: Finger-Nose-Finger Test normal.     Comments: CN II-XII intact.  Speech clear, no pronator drift.     ED Results / Procedures / Treatments   Labs (all labs ordered are listed, but only abnormal results are displayed) Labs Reviewed  BASIC METABOLIC PANEL - Abnormal; Notable for the following components:      Result Value   Sodium 134 (*)    Glucose, Bld 218 (*)    All other components within normal limits  CBC WITH DIFFERENTIAL/PLATELET - Abnormal; Notable for the following components:   Platelets 29 (*)    All other components within normal limits  URINALYSIS, ROUTINE W REFLEX MICROSCOPIC - Abnormal; Notable for the following components:   Color, Urine STRAW (*)    Glucose, UA 50 (*)    Protein, ur 100 (*)    All other components within normal limits  TROPONIN I (HIGH SENSITIVITY)  TROPONIN I (HIGH SENSITIVITY)    EKG EKG Interpretation  Date/Time:  Saturday January 26 2020 11:59:30 EDT Ventricular Rate:  64 PR Interval:    QRS Duration: 107 QT Interval:  449 QTC Calculation: 464 R Axis:   -70 Text Interpretation: Normal sinus rhythm Incomplete left bundle branch block Inferior infarct, old Confirmed by Noemi Chapel [id number]) on 01/26/2020 12:39:20 PM   Radiology DG Chest 1 View  Result Date: 01/26/2020 CLINICAL DATA:  Dizziness EXAM: CHEST  1 VIEW COMPARISON:  None. FINDINGS: A small portion of the lateral right base is not included on this  study. There is mild apparent  scarring in the left base. Visualized lungs elsewhere clear. Heart is mildly enlarged with pulmonary vascularity normal. No adenopathy. No evident pneumothorax. There old healed rib fractures on the right. IMPRESSION: Note that a portion of the inferolateral right base is not included on this study. Slight scarring left base. Lungs elsewhere clear. There is cardiomegaly. No adenopathy evident. Electronically Signed   By: Lowella Grip III M.D.   On: 01/26/2020 10:10    Procedures Procedures (including critical care time)  Medications Ordered in ED Medications - No data to display  ED Course  I have reviewed the triage vital signs and the nursing notes.  Pertinent labs & imaging results that were available during my care of the patient were reviewed by me and considered in my medical decision making (see chart for details).    MDM Rules/Calculators/A&P                      Patient here with symptoms of dizziness intermittently for several days.  On exam, he is well-appearing.  He is asymptomatic unless he sits up or stands.  Head movement also reproduces his symptoms.  He has no focal neuro deficits on exam, no associated symptoms to suggest acute neurological process or need for emergent CT head.  Work-up today shows significant thrombocytopenia.  Patient has history of same, on review of medical records his platelet count has typically been in the 50s and today his platelet count is 29.  He is followed by hematology, Dr. Delton Coombes for his thrombocytopenia.  I have consulted Dr. Delton Coombes regarding findings and he suggest Decadron 40 mg daily for 4 days and he will contact patient to arrange follow-up.  Patient also seen by Dr. Sabra Heck and care plan discussed. Pt agrees to plan, strict return precautions discussed   Final Clinical Impression(s) / ED Diagnoses Final diagnoses:  Vertigo  Thrombocytopenia [facility])    Rx / DC Orders ED Discharge Orders     None       Kem Parkinson, PA-C 01/27/20 0742    Noemi Chapel, MD 01/29/20 [phone]

## 2020-01-26 NOTE — ED Notes (Signed)
Date and time results received: 04/03/211029 (use smartphrase ".now" to insert current time)  Test: platelets  Critical Value: 29  Name of Provider Notified: Triplett  Orders Received? Or Actions Taken?:

## 2020-01-26 NOTE — Discharge Instructions (Addendum)
You may continue to take your atorvastatin, 10 mg once daily.  Start the dexamethasone tomorrow.  You will need to closely monitor your blood sugar while taking the dexamethasone as steroid medication can elevate your blood sugar.  Someone from Dr. Tomie China office should contact you on Monday to arrange follow-up, if not you may call his office.  I have prescribed meclizine which should help with your dizziness.  Return the emergency department for any worsening symptoms.

## 2020-01-26 NOTE — ED Triage Notes (Addendum)
Pt has been dizzy since Wednesday. Pt reports taking new cholesterol medication. Per pt doctor told he can double up on atorvastation. He has a sore on both arms that has turned darker in the area. Reports leaning and unsteady when he walks and some episodes of room spinning

## 2020-01-26 NOTE — ED Provider Notes (Signed)
Medical screening examination/treatment/procedure(s) were conducted as a shared visit with non-physician practitioner(s) and myself.  I personally evaluated the patient during the encounter.  Clinical Impression:   Final diagnoses:  Vertigo  Thrombocytopenia ([place])    This very pleasant 79 year old male presents with intermittent vertigo which has been going on for a couple of days, seems to get worse with change in position, he notes that when he stands up or tries to look down it makes it much worse, when he is sitting down or laying back it makes it much better.  At this time he is asymptomatic.  When I have the patient sit forward and look at his toes he has acute onset of vertigo, this last for about a minute, fatigues and then goes away.  Otherwise his neurologic exam is totally unremarkable with normal strength and sensation diffusely.  Cranial nerves III through XII are normal, speech is clear.  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  I suspect he has peripheral vertigo, he does not have a central source clinically and with the acute onset of easily fatigable and short-lived vertigo with head movement this seems much more peripheral.  The patient is agreeable to the plan to go home and follow-up in the outpatient setting.   Noemi Chapel, MD 01/29/20 [phone]

## 2020-01-26 NOTE — ED Triage Notes (Addendum)
Pt has been dizzy since Wednesday. Pt started taking new cholesterol medication.  Doctor told him he could double the medication and he doubled it on Tuesday.  He has a sore on both his arms that has turned darker in the area. Could use higher level of care.  Pt is agreeable.

## 2020-01-28 ENCOUNTER — Other Ambulatory Visit (HOSPITAL_COMMUNITY): Payer: Self-pay | Admitting: *Deleted

## 2020-01-28 DIAGNOSIS — D696 Thrombocytopenia, unspecified: Secondary | ICD-10-CM

## 2020-01-31 ENCOUNTER — Other Ambulatory Visit: Payer: Self-pay

## 2020-01-31 ENCOUNTER — Inpatient Hospital Stay (HOSPITAL_BASED_OUTPATIENT_CLINIC_OR_DEPARTMENT_OTHER): Payer: Medicare Other | Admitting: Hematology

## 2020-01-31 ENCOUNTER — Inpatient Hospital Stay (HOSPITAL_COMMUNITY): Payer: Medicare Other | Attending: Hematology

## 2020-01-31 ENCOUNTER — Encounter (HOSPITAL_COMMUNITY): Payer: Self-pay | Admitting: Hematology

## 2020-01-31 DIAGNOSIS — Z79899 Other long term (current) drug therapy: Secondary | ICD-10-CM | POA: Diagnosis not present

## 2020-01-31 DIAGNOSIS — R1013 Epigastric pain: Secondary | ICD-10-CM | POA: Insufficient documentation

## 2020-01-31 DIAGNOSIS — M129 Arthropathy, unspecified: Secondary | ICD-10-CM | POA: Diagnosis not present

## 2020-01-31 DIAGNOSIS — D696 Thrombocytopenia, unspecified: Secondary | ICD-10-CM

## 2020-01-31 DIAGNOSIS — R42 Dizziness and giddiness: Secondary | ICD-10-CM | POA: Insufficient documentation

## 2020-01-31 DIAGNOSIS — E119 Type 2 diabetes mellitus without complications: Secondary | ICD-10-CM | POA: Insufficient documentation

## 2020-01-31 DIAGNOSIS — G4730 Sleep apnea, unspecified: Secondary | ICD-10-CM | POA: Diagnosis not present

## 2020-01-31 DIAGNOSIS — Z7984 Long term (current) use of oral hypoglycemic drugs: Secondary | ICD-10-CM | POA: Diagnosis not present

## 2020-01-31 DIAGNOSIS — E785 Hyperlipidemia, unspecified: Secondary | ICD-10-CM | POA: Insufficient documentation

## 2020-01-31 LAB — CBC WITH DIFFERENTIAL/PLATELET
Abs Immature Granulocytes: 0.16 10*3/uL — ABNORMAL HIGH (ref 0.00–0.07)
Basophils Absolute: 0 10*3/uL (ref 0.0–0.1)
Basophils Relative: 0 %
Eosinophils Absolute: 0.1 10*3/uL (ref 0.0–0.5)
Eosinophils Relative: 1 %
HCT: 45 % (ref 39.0–52.0)
Hemoglobin: 15.8 g/dL (ref 13.0–17.0)
Immature Granulocytes: 1 %
Lymphocytes Relative: 27 %
Lymphs Abs: 3.6 10*3/uL (ref 0.7–4.0)
MCH: 32.2 pg (ref 26.0–34.0)
MCHC: 35.1 g/dL (ref 30.0–36.0)
MCV: 91.6 fL (ref 80.0–100.0)
Monocytes Absolute: 1.3 10*3/uL — ABNORMAL HIGH (ref 0.1–1.0)
Monocytes Relative: 10 %
Neutro Abs: 8.2 10*3/uL — ABNORMAL HIGH (ref 1.7–7.7)
Neutrophils Relative %: 61 %
Platelets: 108 10*3/uL — ABNORMAL LOW (ref 150–400)
RBC: 4.91 MIL/uL (ref 4.22–5.81)
RDW: 12.6 % (ref 11.5–15.5)
WBC: 13.4 10*3/uL — ABNORMAL HIGH (ref 4.0–10.5)
nRBC: 0 % (ref 0.0–0.2)

## 2020-01-31 LAB — COMPREHENSIVE METABOLIC PANEL
ALT: 20 U/L (ref 0–44)
AST: 17 U/L (ref 15–41)
Albumin: 3.6 g/dL (ref 3.5–5.0)
Alkaline Phosphatase: 49 U/L (ref 38–126)
Anion gap: 10 (ref 5–15)
BUN: 34 mg/dL — ABNORMAL HIGH (ref 8–23)
CO2: 22 mmol/L (ref 22–32)
Calcium: 9 mg/dL (ref 8.9–10.3)
Chloride: 102 mmol/L (ref 98–111)
Creatinine, Ser: 1.02 mg/dL (ref 0.61–1.24)
GFR calc Af Amer: >60 mL/min (ref >60)
GFR calc non Af Amer: >60 mL/min (ref >60)
Glucose, Bld: 186 mg/dL — ABNORMAL HIGH (ref 70–99)
Potassium: 3.8 mmol/L (ref 3.5–5.1)
Sodium: 134 mmol/L — ABNORMAL LOW (ref 135–145)
Total Bilirubin: 0.7 mg/dL (ref 0.3–1.2)
Total Protein: 6.4 g/dL — ABNORMAL LOW (ref 6.5–8.1)

## 2020-01-31 NOTE — Progress Notes (Signed)
[place], [place] [address]   CLINIC:  Medical Oncology/Hematology  PCP:  Celene Squibb, MD [place] [address] [phone]   REASON FOR VISIT:  Follow-up for thrombocytopenia.  CURRENT THERAPY: Observation.   INTERVAL HISTORY:  Brian [name] 79 y.o. male seen for follow-up of immune mediated thrombocytopenia.  He recently presented to the ER with vertigo.  He was found to have severely low platelet count.  Appetite and energy levels are 75%.  He could not tolerate dexamethasone as he could not sleep for days when he took it.  He also reported epigastric pain with burping and hiccups.  Abdominal pain has improved.  Chronic dizziness has been stable.    REVIEW OF SYSTEMS:  Review of Systems  Gastrointestinal: Positive for abdominal pain.  Neurological: Positive for dizziness.  Psychiatric/Behavioral: Positive for sleep disturbance.  All other systems reviewed and are negative.    PAST MEDICAL/SURGICAL HISTORY:  Past Medical History:  Diagnosis Date  . Arthritis   . Atypical chest pain    Low-risk exercise stress Cardiolite EF 60% April 2008. Reportedly normal remote cardiac catheterization @ [place] EF 60%...nuclear....2008  . Bradycardia    hospital 03/2010 stopped beta blocker  . Complication of anesthesia   . Diabetes mellitus without complication ([place])   . Dyslipidemia   . Hypertension   . Mild aortic regurgitation with LV dilation by prior echocardiogram   . Obesity   . Obstructive sleep apnea    C-Pap compliant  . PONV (postoperative nausea and vomiting)   . Thrombocytopenia (HCC)    mild follow Dr. Alonna Minium  . Vertigo    Hospital 03/2010   Past Surgical History:  Procedure Laterality Date  . CARDIAC CATHETERIZATION    . KNEE ARTHROSCOPY WITH MEDIAL MENISECTOMY Left 05/31/2014   Procedure: LEFT KNEE ARTHROSCOPY WITH MEDIAL MENISECTOMY;  Surgeon: Carole Civil, MD;  Location: [facility];  Service:  Orthopedics;  Laterality: Left;  . Status post bilateral arthroscopic knee surgery    . 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 Marital status: Married    Spouse name: Not on file  . Number of children: 3  . Years of education: Not on file  . Highest education level: Not on file  Occupational History  . Not on file  Tobacco Use  . Smoking status: Never Smoker  . Smokeless tobacco: Never Used  Substance and Sexual Activity  . Alcohol use: Yes    Comment: wine on occasion  . Drug use: No  . Sexual activity: Not on file  Other Topics Concern  . Not on file  Social History Narrative  . Not on file   Social Determinants of Health   Financial Resource Strain:   . Difficulty of Paying Living Expenses:   Food Insecurity:   . Worried About Charity fundraiser in the Last Year:   . Arboriculturist in the Last Year:   Transportation Needs:   . Film/video editor (Medical):   Marland Kitchen Lack of Transportation (Non-Medical):   Physical Activity:   . Days of Exercise per Week:   . Minutes of Exercise per Session:   Stress:   . Feeling of Stress :   Social Connections:   .  Frequency of Communication with Friends and Family:   . Frequency of Social Gatherings with Friends and Family:   . Attends Religious Services:   . Active Member of Clubs or Organizations:   . Attends Archivist Meetings:   Marland Kitchen Marital Status:   Intimate Partner Violence:   . Fear of Current or Ex-Partner:   . Emotionally Abused:   Marland Kitchen Physically Abused:   . Sexually Abused:     FAMILY HISTORY:  Family History  Problem Relation Age of Onset  . Heart attack Mother        fatal, cause of death  . Heart attack Maternal Grandmother     CURRENT MEDICATIONS:  Outpatient Encounter Medications as of  01/31/2020  Medication Sig Note  . acetaminophen (TYLENOL) 500 MG tablet Take 1,000 mg by mouth every 6 (six) hours as needed.   Marland Kitchen amLODipine (NORVASC) 10 MG tablet Take 10 mg by mouth daily.    Marland Kitchen atorvastatin (LIPITOR) 10 MG tablet Take 10 mg by mouth daily.   . B Complex Vitamins (VITAMIN B-COMPLEX) TABS Take 1 tablet by mouth daily.    Marland Kitchen dexamethasone (DECADRON) 4 MG tablet Take 10 tablets (40 mg total) by mouth daily. For 3 days   . fluticasone (FLONASE) 50 MCG/ACT nasal spray Place 1 spray into both nostrils daily.    Marland Kitchen gabapentin (NEURONTIN) 100 MG capsule Take 100 mg by mouth daily. 01/26/2020: Takes with 600 mg if needed.  . gabapentin (NEURONTIN) 600 MG tablet Take 600 mg by mouth 2 (two) times daily.    Marland Kitchen glipiZIDE (GLUCOTROL XL) 5 MG 24 hr tablet Take 5 mg by mouth 2 (two) times daily.    . Glucosamine-Chondroit-Vit C-Mn (GLUCOSAMINE 1500 COMPLEX PO) Take 1 tablet by mouth daily.    . meclizine (ANTIVERT) 25 MG tablet Take 1 tablet (25 mg total) by mouth 3 (three) times daily as needed for dizziness. may cause drowsiness   . Melatonin 3 MG TABS Take 6 mg by mouth at bedtime.    . metFORMIN (GLUCOPHAGE) 500 MG tablet Take 500 mg by mouth 4 (four) times daily.    . mupirocin ointment (BACTROBAN) 2 % Apply 1 application topically 3 times/day as needed-between meals & bedtime.  01/26/2020: on hand if needed  . olmesartan (BENICAR) 40 MG tablet Take 40 mg by mouth daily.    Marland Kitchen triamcinolone cream (KENALOG) 0.5 % Apply 1 application topically as needed.  01/26/2020: On hand id needed   No facility-administered encounter medications on file as of 01/31/2020.    ALLERGIES:  Allergies  Allergen Reactions  . Penicillins Other (See Comments)    DIZZINESS Has patient had a PCN reaction causing immediate rash, facial/tongue/throat swelling, SOB or lightheadedness with hypotension: No Has patient had a PCN reaction causing severe rash involving mucus membranes or skin necrosis: No Has patient had a  PCN reaction that required hospitalization No Has patient had a PCN reaction occurring within the last 10 years: No If all of the above answers are "NO", then may proceed with Cephalosporin use.  DIZZINESS Has patient had a PCN reaction causing immediate rash, facial/tongue/throat swelling, SOB or lightheadedness with hypotension: No Has patient had a PCN reaction causing severe rash involving mucus membranes or skin necrosis: No Has patient had a PCN reaction that required hospitalization No Has patient had a PCN reaction occurring within the last 10 years: No If all of the above answers are "NO", then may proceed with Cephalosporin use.  PHYSICAL EXAM:  ECOG Performance status: 1  There were no vitals filed for this visit. There were no vitals filed for this visit.  Physical Exam Vitals reviewed.  Constitutional:      Appearance: Normal appearance.  Cardiovascular:     Rate and Rhythm: Normal rate.     Heart sounds: Normal heart sounds.  Pulmonary:     Effort: Pulmonary effort is normal.     Breath sounds: Normal breath sounds.  Abdominal:     General: There is no distension.     Palpations: Abdomen is soft. There is no mass.  Lymphadenopathy:     Cervical: No cervical adenopathy.  Skin:    Findings: Bruising present.  Neurological:     Mental Status: He is alert and oriented to person, place, and time.  Psychiatric:        Mood and Affect: Mood normal.        Behavior: Behavior normal.      LABORATORY DATA:  I have reviewed the labs as listed.  CBC    Component Value Date/Time   WBC 13.4 (H) 01/31/2020 1026   RBC 4.91 01/31/2020 1026   HGB 15.8 01/31/2020 1026   HCT 45.0 01/31/2020 1026   PLT 108 (L) 01/31/2020 1026   MCV 91.6 01/31/2020 1026   MCH 32.2 01/31/2020 1026   MCHC 35.1 01/31/2020 1026   RDW 12.6 01/31/2020 1026   LYMPHSABS 3.6 01/31/2020 1026   MONOABS 1.3 (H) 01/31/2020 1026   EOSABS 0.1 01/31/2020 1026   BASOSABS 0.0 01/31/2020 1026    CMP Latest Ref Rng & Units 01/31/2020 01/26/2020 11/28/2019  Glucose 70 - 99 mg/dL 186(H) 218(H) 171(H)  BUN 8 - 23 mg/dL 34(H) 22 18  Creatinine 0.61 - 1.24 mg/dL 1.02 1.06 0.90  Sodium 135 - 145 mmol/L 134(L) 134(L) 136  Potassium 3.5 - 5.1 mmol/L 3.8 3.6 3.6  Chloride 98 - 111 mmol/L 102 100 104  CO2 22 - 32 mmol/L _0 Calcium 8.9 - 10.3 mg/dL 9.0 9.2 8.9  Total Protein 6.5 - 8.1 g/dL 6.4(L) - 7.0  Total Bilirubin 0.3 - 1.2 mg/dL 0.7 - 0.9  Alkaline Phos 38 - 126 U/L 49 - 53  AST 15 - 41 U/L 17 - 17  ALT 0 - 44 U/L 20 - 23       DIAGNOSTIC IMAGING:  I have reviewed scans.   ASSESSMENT & PLAN:   THROMBOCYTOPENIA 1.  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  Bone marrow biopsy in 2009 was reportedly normal.  This was done in [place]. -CTAP at that time showed fatty infiltration with normal spleen. -Further work-up including nutritional deficiency, plasma cell disorders was negative.  Infectious etiology was negative.  LDH was normal. -Reportedly started on Lipitor on 01/22/2020.  Presented to the ER on 01/26/2020 with vertigo. -CBC showed platelet count dropped to 29.  Previously it was 22. -I spoke to the ER doc and prescribed him dexamethasone 40 mg x 4 days.  Last dose was on 01/29/2020. -He could not sleep while taking dexamethasone.  He also reported some burping and hiccups and epigastric pain. -We have reviewed blood work today.  Platelet count improved to 108. -He told me that he will start back on Lipitor.  I plan to repeat his CBC in 4 weeks.      Orders placed this encounter:  Orders Placed This Encounter  Procedures  . CBC with Differential  Derek Jack, MD [place] [phone]

## 2020-01-31 NOTE — Assessment & Plan Note (Signed)
1.  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  Bone marrow biopsy in 2009 was reportedly normal.  This was done in [place]. -CTAP at that time showed fatty infiltration with normal spleen. -Further work-up including nutritional deficiency, plasma cell disorders was negative.  Infectious etiology was negative.  LDH was normal. -Reportedly started on Lipitor on 01/22/2020.  Presented to the ER on 01/26/2020 with vertigo. -CBC showed platelet count dropped to 29.  Previously it was 67. -I spoke to the ER doc and prescribed him dexamethasone 40 mg x 4 days.  Last dose was on 01/29/2020. -He could not sleep while taking dexamethasone.  He also reported some burping and hiccups and epigastric pain. -We have reviewed blood work today.  Platelet count improved to 108. -He told me that he will start back on Lipitor.  I plan to repeat his CBC in 4 weeks.

## 2020-01-31 NOTE — Patient Instructions (Addendum)
[place] at [facility] Discharge Instructions  You were seen today by Dr. Delton Coombes. He went over your recent lab results. Dr. Delton Coombes recommends taking Tums to help with abdominal pain.  He will see you back in 4 weeks for labs and follow up.   Thank you for choosing [place] at [facility] to provide your oncology and hematology care.  To afford each patient quality time with our provider, please arrive at least 15 minutes before your scheduled appointment time.   If you have a lab appointment with the [facility] please come in thru the  Main Entrance and check in at the main information desk  You need to re-schedule your appointment should you arrive 10 or more minutes late.  We strive to give you quality time with our providers, and arriving late affects you and other patients whose appointments are after yours.  Also, if you no show three or more times for appointments you may be dismissed from the clinic at the providers discretion.     Again, thank you for choosing [facility].  Our hope is that these requests will decrease the amount of time that you wait before being seen by our physicians.       _____________________________________________________________  Should you have questions after your visit to [facility], please contact our office at [phone] between the hours of 8:00 a.m. and 4:30 p.m.  Voicemails left after 4:00 p.m. will not be returned until the following business day.  For prescription refill requests, have your pharmacy contact our office and allow 72 hours.    Cancer Center Support Programs:   > Cancer Support Group  2nd Tuesday of the month 1pm-2pm, Journey Room

## 2020-02-04 ENCOUNTER — Other Ambulatory Visit (HOSPITAL_COMMUNITY): Payer: Medicare Other

## 2020-02-04 ENCOUNTER — Ambulatory Visit (HOSPITAL_COMMUNITY): Payer: Medicare Other | Admitting: Hematology

## 2020-02-22 ENCOUNTER — Inpatient Hospital Stay (HOSPITAL_COMMUNITY): Payer: Medicare Other

## 2020-02-22 ENCOUNTER — Other Ambulatory Visit: Payer: Self-pay

## 2020-02-22 DIAGNOSIS — D696 Thrombocytopenia, unspecified: Secondary | ICD-10-CM

## 2020-02-22 DIAGNOSIS — R42 Dizziness and giddiness: Secondary | ICD-10-CM | POA: Diagnosis not present

## 2020-02-22 DIAGNOSIS — M129 Arthropathy, unspecified: Secondary | ICD-10-CM | POA: Diagnosis not present

## 2020-02-22 DIAGNOSIS — E119 Type 2 diabetes mellitus without complications: Secondary | ICD-10-CM | POA: Diagnosis not present

## 2020-02-22 DIAGNOSIS — R1013 Epigastric pain: Secondary | ICD-10-CM | POA: Diagnosis not present

## 2020-02-22 DIAGNOSIS — E785 Hyperlipidemia, unspecified: Secondary | ICD-10-CM | POA: Diagnosis not present

## 2020-02-22 LAB — COMPREHENSIVE METABOLIC PANEL
ALT: 18 U/L (ref 0–44)
AST: 19 U/L (ref 15–41)
Albumin: 4.1 g/dL (ref 3.5–5.0)
Alkaline Phosphatase: 50 U/L (ref 38–126)
Anion gap: 9 (ref 5–15)
BUN: 30 mg/dL — ABNORMAL HIGH (ref 8–23)
CO2: 21 mmol/L — ABNORMAL LOW (ref 22–32)
Calcium: 9.3 mg/dL (ref 8.9–10.3)
Chloride: 104 mmol/L (ref 98–111)
Creatinine, Ser: 1.14 mg/dL (ref 0.61–1.24)
GFR calc Af Amer: >60 mL/min (ref >60)
GFR calc non Af Amer: >60 mL/min (ref >60)
Glucose, Bld: 170 mg/dL — ABNORMAL HIGH (ref 70–99)
Potassium: 3.8 mmol/L (ref 3.5–5.1)
Sodium: 134 mmol/L — ABNORMAL LOW (ref 135–145)
Total Bilirubin: 0.7 mg/dL (ref 0.3–1.2)
Total Protein: 6.6 g/dL (ref 6.5–8.1)

## 2020-02-22 LAB — CBC WITH DIFFERENTIAL/PLATELET
Abs Immature Granulocytes: 0.02 10*3/uL (ref 0.00–0.07)
Basophils Absolute: 0 10*3/uL (ref 0.0–0.1)
Basophils Relative: 1 %
Eosinophils Absolute: 0.4 10*3/uL (ref 0.0–0.5)
Eosinophils Relative: 6 %
HCT: 39.2 % (ref 39.0–52.0)
Hemoglobin: 13.5 g/dL (ref 13.0–17.0)
Immature Granulocytes: 0 %
Lymphocytes Relative: 30 %
Lymphs Abs: 1.9 10*3/uL (ref 0.7–4.0)
MCH: 32.1 pg (ref 26.0–34.0)
MCHC: 34.4 g/dL (ref 30.0–36.0)
MCV: 93.3 fL (ref 80.0–100.0)
Monocytes Absolute: 0.7 10*3/uL (ref 0.1–1.0)
Monocytes Relative: 11 %
Neutro Abs: 3.3 10*3/uL (ref 1.7–7.7)
Neutrophils Relative %: 52 %
Platelets: 51 10*3/uL — ABNORMAL LOW (ref 150–400)
RBC: 4.2 MIL/uL — ABNORMAL LOW (ref 4.22–5.81)
RDW: 12.6 % (ref 11.5–15.5)
WBC: 6.3 10*3/uL (ref 4.0–10.5)
nRBC: 0 % (ref 0.0–0.2)

## 2020-02-22 LAB — LACTATE DEHYDROGENASE: LDH: 147 U/L (ref 98–192)

## 2020-02-22 LAB — FOLATE: Folate: 26 ng/mL (ref >5.9)

## 2020-02-22 LAB — VITAMIN B12: Vitamin B-12: 325 pg/mL (ref 180–914)

## 2020-02-25 ENCOUNTER — Ambulatory Visit (HOSPITAL_COMMUNITY): Payer: Medicare Other | Admitting: Hematology

## 2020-03-04 ENCOUNTER — Other Ambulatory Visit: Payer: Self-pay

## 2020-03-04 ENCOUNTER — Inpatient Hospital Stay (HOSPITAL_COMMUNITY): Payer: Medicare Other | Attending: Hematology | Admitting: Hematology

## 2020-03-04 ENCOUNTER — Encounter (HOSPITAL_COMMUNITY): Payer: Self-pay | Admitting: Hematology

## 2020-03-04 VITALS — BP 167/80 | HR 69 | Temp 96.9°F | Resp 19 | Wt 271.5 lb

## 2020-03-04 DIAGNOSIS — Z79899 Other long term (current) drug therapy: Secondary | ICD-10-CM | POA: Diagnosis not present

## 2020-03-04 DIAGNOSIS — D696 Thrombocytopenia, unspecified: Secondary | ICD-10-CM | POA: Insufficient documentation

## 2020-03-04 DIAGNOSIS — E669 Obesity, unspecified: Secondary | ICD-10-CM | POA: Insufficient documentation

## 2020-03-04 DIAGNOSIS — R42 Dizziness and giddiness: Secondary | ICD-10-CM | POA: Insufficient documentation

## 2020-03-04 DIAGNOSIS — E119 Type 2 diabetes mellitus without complications: Secondary | ICD-10-CM | POA: Diagnosis not present

## 2020-03-04 DIAGNOSIS — G4733 Obstructive sleep apnea (adult) (pediatric): Secondary | ICD-10-CM | POA: Diagnosis not present

## 2020-03-04 DIAGNOSIS — E785 Hyperlipidemia, unspecified: Secondary | ICD-10-CM | POA: Insufficient documentation

## 2020-03-04 DIAGNOSIS — Z7984 Long term (current) use of oral hypoglycemic drugs: Secondary | ICD-10-CM | POA: Diagnosis not present

## 2020-03-04 NOTE — Assessment & Plan Note (Addendum)
1.  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  Bone marrow biopsy in 2009 was reportedly normal.  This was done in [place]. -CTAP showed fatty infiltration of the liver with normal spleen. -Work-up for nutritional deficiencies, plasma cell disorder was negative.  Infectious etiology was negative.  LDH was normal. -Platelet count dropped to 29 on 01/26/2020 and was treated with dexamethasone for 4 days. -Dexamethasone made to him have epigastric pain, indigestion, burping and hiccups. -Peak platelet count reached 108 on 01/31/2020. -We reviewed CBC from today.  Platelet count dropped back again to 51.  White count and hemoglobin was normal.  N35 and folic acid was normal. -I plan to reevaluate him in 2 months.  We plan to treat again if platelet count drops below 30.  I have counseled him to come back sooner should he develop any spontaneous bruising or bleeding.  As he cannot tolerate dexamethasone, options include Promacta.

## 2020-03-04 NOTE — Progress Notes (Signed)
[address] [place], [place] [address]   CLINIC:  Medical Oncology/Hematology  PCP:  Celene Squibb, MD [place] [address] [phone]   REASON FOR VISIT:  Follow-up for thrombocytopenia.  CURRENT THERAPY: Observation.   INTERVAL HISTORY:  [name] 79 y.o. male seen for follow-up of immune mediated thrombocytopenia.  Does not report any active nosebleeds, hematuria or bleeding per rectum.  Appetite is 100%.  Energy levels are 50%.  Occasional dizziness has been stable.  Epigastric pain, indigestion has resolved.    REVIEW OF SYSTEMS:  Review of Systems  Neurological: Positive for dizziness.  All other systems reviewed and are negative.    PAST MEDICAL/SURGICAL HISTORY:  Past Medical History:  Diagnosis Date  . Arthritis   . Atypical chest pain    Low-risk exercise stress Cardiolite EF 60% April 2008. Reportedly normal remote cardiac catheterization @ [facility] EF 60%...nuclear....2008  . Bradycardia    hospital 03/2010 stopped beta blocker  . Complication of anesthesia   . Diabetes mellitus without complication ([place])   . Dyslipidemia   . Hypertension   . Mild aortic regurgitation with LV dilation by prior echocardiogram   . Obesity   . Obstructive sleep apnea    C-Pap compliant  . PONV (postoperative nausea and vomiting)   . Thrombocytopenia (HCC)    mild follow Dr. Alonna Minium  . Vertigo    Hospital 03/2010   Past Surgical History:  Procedure Laterality Date  . CARDIAC CATHETERIZATION    . KNEE ARTHROSCOPY WITH MEDIAL MENISECTOMY Left 05/31/2014   Procedure: LEFT KNEE ARTHROSCOPY WITH MEDIAL MENISECTOMY;  Surgeon: Carole Civil, MD;  Location: [facility];  Service: Orthopedics;  Laterality: Left;  . Status post bilateral arthroscopic knee surgery    . 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 Marital status: Married    Spouse name: Not on file  . Number of children: 3  . Years of education: Not on file  . Highest education level: Not on file  Occupational History  . Not on file  Tobacco Use  . Smoking status: Never Smoker  . Smokeless tobacco: Never Used  Substance and Sexual Activity  . Alcohol use: Yes    Comment: wine on occasion  . Drug use: No  . Sexual activity: Not on file  Other Topics Concern  . Not on file  Social History Narrative  . Not on file   Social Determinants of Health   Financial Resource Strain:   . Difficulty of Paying Living Expenses:   Food Insecurity:   . Worried About Charity fundraiser in the Last Year:   . Arboriculturist in the Last Year:   Transportation Needs:   . Film/video editor (Medical):   Marland Kitchen Lack of Transportation (Non-Medical):   Physical Activity:   . Days of Exercise per Week:   . Minutes of Exercise per Session:   Stress:   . Feeling of Stress :   Social Connections:   . Frequency of Communication with Friends and Family:   . Frequency of Social Gatherings with Friends and Family:   . Attends Religious Services:   . Active Member of Clubs or Organizations:   . Attends Archivist Meetings:   .  Marital Status:   Intimate Partner Violence:   . Fear of Current or Ex-Partner:   . Emotionally Abused:   Marland Kitchen Physically Abused:   . Sexually Abused:     FAMILY HISTORY:  Family History  Problem Relation Age of Onset  . Heart attack Mother        fatal, cause of death  . Heart attack Maternal Grandmother     CURRENT MEDICATIONS:  Outpatient Encounter Medications as of 03/04/2020  Medication Sig Note  . amLODipine (NORVASC) 10 MG tablet Take 10 mg by mouth daily.    Marland Kitchen atorvastatin (LIPITOR) 10 MG tablet Take 10 mg by mouth daily.   . B Complex Vitamins (VITAMIN B-COMPLEX) TABS Take 1 tablet by mouth daily.    . fluticasone  (FLONASE) 50 MCG/ACT nasal spray Place 1 spray into both nostrils daily.    Marland Kitchen gabapentin (NEURONTIN) 100 MG capsule Take 100 mg by mouth daily. 01/26/2020: Takes with 600 mg if needed.  . gabapentin (NEURONTIN) 600 MG tablet Take 600 mg by mouth 2 (two) times daily.    Marland Kitchen glipiZIDE (GLUCOTROL XL) 5 MG 24 hr tablet Take 5 mg by mouth 2 (two) times daily.    . Glucosamine-Chondroit-Vit C-Mn (GLUCOSAMINE 1500 COMPLEX PO) Take 1 tablet by mouth daily.    . Melatonin 3 MG TABS Take 6 mg by mouth at bedtime.    . metFORMIN (GLUCOPHAGE) 500 MG tablet Take 500 mg by mouth 4 (four) times daily.    Marland Kitchen olmesartan (BENICAR) 40 MG tablet Take 40 mg by mouth daily.    . [DISCONTINUED] dexamethasone (DECADRON) 4 MG tablet Take 10 tablets (40 mg total) by mouth daily. For 3 days   . acetaminophen (TYLENOL) 500 MG tablet Take 1,000 mg by mouth every 6 (six) hours as needed.   . meclizine (ANTIVERT) 25 MG tablet Take 1 tablet (25 mg total) by mouth 3 (three) times daily as needed for dizziness. may cause drowsiness (Patient not taking: Reported on 03/04/2020)   . mupirocin ointment (BACTROBAN) 2 % Apply 1 application topically 3 times/day as needed-between meals & bedtime.  01/26/2020: on hand if needed  . triamcinolone cream (KENALOG) 0.5 % Apply 1 application topically as needed.  01/26/2020: On hand id needed   No facility-administered encounter medications on file as of 03/04/2020.    ALLERGIES:  Allergies  Allergen Reactions  . Penicillins Other (See Comments)    DIZZINESS Has patient had a PCN reaction causing immediate rash, facial/tongue/throat swelling, SOB or lightheadedness with hypotension: No Has patient had a PCN reaction causing severe rash involving mucus membranes or skin necrosis: No Has patient had a PCN reaction that required hospitalization No Has patient had a PCN reaction occurring within the last 10 years: No If all of the above answers are "NO", then may proceed with Cephalosporin  use.  DIZZINESS Has patient had a PCN reaction causing immediate rash, facial/tongue/throat swelling, SOB or lightheadedness with hypotension: No Has patient had a PCN reaction causing severe rash involving mucus membranes or skin necrosis: No Has patient had a PCN reaction that required hospitalization No Has patient had a PCN reaction occurring within the last 10 years: No If all of the above answers are "NO", then may proceed with Cephalosporin use.     PHYSICAL EXAM:  ECOG Performance status: 1  Vitals:   03/04/20 1604  BP: (!) 167/80  Pulse: 69  Resp: 19  Temp: (!) 96.9 F (36.1 C)  SpO2: 97%   Filed Weights  03/04/20 1604  Weight: 271 lb 8 oz (123.2 kg)    Physical Exam Vitals reviewed.  Constitutional:      Appearance: Normal appearance.  Abdominal:     General: There is no distension.     Palpations: There is no mass.  Lymphadenopathy:     Cervical: No cervical adenopathy.  Skin:    Findings: Bruising present.  Neurological:     General: No focal deficit present.     Mental Status: He is alert and oriented to person, place, and time.  Psychiatric:        Mood and Affect: Mood normal.        Behavior: Behavior normal.      LABORATORY DATA:  I have reviewed the labs as listed.  CBC    Component Value Date/Time   WBC 6.3 02/22/2020 1004   RBC 4.20 (L) 02/22/2020 1004   HGB 13.5 02/22/2020 1004   HCT 39.2 02/22/2020 1004   PLT 51 (L) 02/22/2020 1004   MCV 93.3 02/22/2020 1004   MCH 32.1 02/22/2020 1004   MCHC 34.4 02/22/2020 1004   RDW 12.6 02/22/2020 1004   LYMPHSABS 1.9 02/22/2020 1004   MONOABS 0.7 02/22/2020 1004   EOSABS 0.4 02/22/2020 1004   BASOSABS 0.0 02/22/2020 1004   CMP Latest Ref Rng & Units 02/22/2020 01/31/2020 01/26/2020  Glucose 70 - 99 mg/dL 170(H) 186(H) 218(H)  BUN 8 - 23 mg/dL 30(H) 34(H) 22  Creatinine 0.61 - 1.24 mg/dL 1.14 1.02 1.06  Sodium 135 - 145 mmol/L 134(L) 134(L) 134(L)  Potassium 3.5 - 5.1 mmol/L 3.8 3.8 3.6   Chloride 98 - 111 mmol/L 104 102 100  CO2 22 - 32 mmol/L 21(L) 22 23  Calcium 8.9 - 10.3 mg/dL 9.3 9.0 9.2  Total Protein 6.5 - 8.1 g/dL 6.6 6.4(L) -  Total Bilirubin 0.3 - 1.2 mg/dL 0.7 0.7 -  Alkaline Phos 38 - 126 U/L 50 49 -  AST 15 - 41 U/L 19 17 -  ALT 0 - 44 U/L 18 20 -       DIAGNOSTIC IMAGING:  I have reviewed scans.   ASSESSMENT & PLAN:   THROMBOCYTOPENIA 1.  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  Bone marrow biopsy in 2009 was reportedly normal.  This was done in [place]. -CTAP showed fatty infiltration of the liver with normal spleen. -Work-up for nutritional deficiencies, plasma cell disorder was negative.  Infectious etiology was negative.  LDH was normal. -Platelet count dropped to 29 on 01/26/2020 and was treated with dexamethasone for 4 days. -Dexamethasone made to him have epigastric pain, indigestion, burping and hiccups. -Peak platelet count reached 108 on 01/31/2020. -We reviewed CBC from today.  Platelet count dropped back again to 51.  White count and hemoglobin was normal.  I33 and folic acid was normal. -I plan to reevaluate him in 2 months.  We plan to treat again if platelet count drops below 30.  I have counseled him to come back sooner should he develop any spontaneous bruising or bleeding.  As he cannot tolerate dexamethasone, options include Promacta.      Orders placed this encounter:  Orders Placed This Encounter  Procedures  . CBC with Differential      Derek Jack, MD [place] [phone]

## 2020-03-04 NOTE — Patient Instructions (Addendum)
[facility] at [facility] Discharge Instructions  You were seen today by Dr. Katragadda. He went over your recent lab results. He will see you back in 2 months for labs and follow up.   Thank you for choosing [facility] at [facility] to provide your oncology and hematology care.  To afford each patient quality time with our provider, please arrive at least 15 minutes before your scheduled appointment time.   If you have a lab appointment with the Cancer Center please come in thru the  Main Entrance and check in at the main information desk  You need to re-schedule your appointment should you arrive 10 or more minutes late.  We strive to give you quality time with our providers, and arriving late affects you and other patients whose appointments are after yours.  Also, if you no show three or more times for appointments you may be dismissed from the clinic at the providers discretion.     Again, thank you for choosing [facility].  Our hope is that these requests will decrease the amount of time that you wait before being seen by our physicians.       _____________________________________________________________  Should you have questions after your visit to [facility], please contact our office at [phone] between the hours of 8:00 a.m. and 4:30 p.m.  Voicemails left after 4:00 p.m. will not be returned until the following business day.  For prescription refill requests, have your pharmacy contact our office and allow 72 hours.    Cancer Center Support Programs:   > Cancer Support Group  2nd Tuesday of the month 1pm-2pm, Journey Room    

## 2020-04-09 ENCOUNTER — Other Ambulatory Visit: Payer: Self-pay

## 2020-04-09 ENCOUNTER — Encounter (HOSPITAL_COMMUNITY): Payer: Self-pay | Admitting: Emergency Medicine

## 2020-04-09 ENCOUNTER — Emergency Department (HOSPITAL_COMMUNITY): Payer: Medicare Other

## 2020-04-09 ENCOUNTER — Emergency Department (HOSPITAL_COMMUNITY)
Admission: EM | Admit: 2020-04-09 | Discharge: 2020-04-09 | Disposition: A | Discharge status: Home / Self Care | Payer: Medicare Other | Attending: Emergency Medicine | Admitting: Emergency Medicine

## 2020-04-09 ENCOUNTER — Ambulatory Visit: Payer: Medicare Other | Admitting: Orthopedic Surgery

## 2020-04-09 DIAGNOSIS — W1849XA Other slipping, tripping and stumbling without falling, initial encounter: Secondary | ICD-10-CM | POA: Diagnosis not present

## 2020-04-09 DIAGNOSIS — E119 Type 2 diabetes mellitus without complications: Secondary | ICD-10-CM | POA: Diagnosis not present

## 2020-04-09 DIAGNOSIS — S8001XA Contusion of right knee, initial encounter: Secondary | ICD-10-CM | POA: Insufficient documentation

## 2020-04-09 DIAGNOSIS — W19XXXA Unspecified fall, initial encounter: Secondary | ICD-10-CM

## 2020-04-09 DIAGNOSIS — S0083XA Contusion of other part of head, initial encounter: Secondary | ICD-10-CM | POA: Diagnosis not present

## 2020-04-09 DIAGNOSIS — Y9289 Other specified places as the place of occurrence of the external cause: Secondary | ICD-10-CM | POA: Insufficient documentation

## 2020-04-09 DIAGNOSIS — Z79899 Other long term (current) drug therapy: Secondary | ICD-10-CM | POA: Diagnosis not present

## 2020-04-09 DIAGNOSIS — Z7984 Long term (current) use of oral hypoglycemic drugs: Secondary | ICD-10-CM | POA: Diagnosis not present

## 2020-04-09 DIAGNOSIS — I10 Essential (primary) hypertension: Secondary | ICD-10-CM | POA: Diagnosis not present

## 2020-04-09 DIAGNOSIS — S0231XA Fracture of orbital floor, right side, initial encounter for closed fracture: Secondary | ICD-10-CM | POA: Diagnosis present

## 2020-04-09 DIAGNOSIS — Y998 Other external cause status: Secondary | ICD-10-CM | POA: Insufficient documentation

## 2020-04-09 DIAGNOSIS — Z880 Allergy status to penicillin: Secondary | ICD-10-CM | POA: Diagnosis not present

## 2020-04-09 DIAGNOSIS — Y9389 Activity, other specified: Secondary | ICD-10-CM | POA: Insufficient documentation

## 2020-04-09 MED ORDER — TETRACAINE HCL 0.5 % OP SOLN
2.0000 [drp] | Freq: Once | OPHTHALMIC | Status: AC
  Administered 2020-04-09: 2 [drp] via OPHTHALMIC
  Filled 2020-04-09: qty 4

## 2020-04-09 MED ORDER — HYDROCODONE-ACETAMINOPHEN 5-325 MG PO TABS: 1.0000 | ORAL_TABLET | ORAL | 0 refills | Status: DC | PRN

## 2020-04-09 MED ORDER — FLUORESCEIN SODIUM 1 MG OP STRP
1.0000 | ORAL_STRIP | Freq: Once | OPHTHALMIC | Status: AC
  Administered 2020-04-09: 1 via OPHTHALMIC
  Filled 2020-04-09: qty 1

## 2020-04-09 NOTE — ED Provider Notes (Signed)
[facility] EMERGENCY DEPARTMENT Provider Note   CSN: [id number] Arrival date & time: 04/09/20  1752     History Chief Complaint  Patient presents with   Loraine Leriche is a 79 y.o. male.  Pt presents to the ED today with right sided facial pain, right knee pain, and chest wall pain.  Pt was coming back from [place] today from visiting a first grandchild.  He stopped at a rest stop and tripped.  Their stairs were railroad ties and uneven.  He did not have a loc, but did hit his face and head hard.  He took 1000 mg tylenol after falling.  He has a blue tint on his vision to his left eye and an yellow/orange tint to the right eye.  Otherwise, he sees ok with his glasses.  No double vision.  Tetanus is UTD.  No blood thinners.        Past Medical History:  Diagnosis Date   Arthritis    Atypical chest pain    Low-risk exercise stress Cardiolite EF 60% April 2008. Reportedly normal remote cardiac catheterization @ [place] EF 60%...nuclear....2008   Bradycardia    hospital 03/2010 stopped beta blocker   Complication of anesthesia    Diabetes mellitus without complication ([place])    Dyslipidemia    Hypertension    Mild aortic regurgitation with LV dilation by prior echocardiogram    Obesity    Obstructive sleep apnea    C-Pap compliant   PONV (postoperative nausea and vomiting)    Thrombocytopenia ([place])    mild follow Dr. 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 06/03/2014   Derangement of posterior horn of medial meniscus 04/09/2014   OA (osteoarthritis) of knee 04/09/2014   Knee pain 04/09/2014   THROMBOCYTOPENIA 05/12/2010   AORTIC INSUFFICIENCY 05/12/2010   OBESITY,  UNSPECIFIED 05/11/2010   HYPERTENSION 05/11/2010   BRADYCARDIA 05/11/2010   VERTIGO 05/11/2010   UNSPECIFIED SLEEP APNEA 05/11/2010   CHEST PAIN UNSPECIFIED 05/11/2010    Past Surgical History:  Procedure Laterality Date   CARDIAC CATHETERIZATION     KNEE ARTHROSCOPY WITH MEDIAL MENISECTOMY Left 05/31/2014   Procedure: LEFT KNEE ARTHROSCOPY WITH MEDIAL MENISECTOMY;  Surgeon: Carole Civil, MD;  Location: [facility];  Service: Orthopedics;  Laterality: Left;   Status post bilateral arthroscopic knee surgery     TOTAL KNEE ARTHROPLASTY Left 04/14/2016   Procedure: LEFT TOTAL KNEE ARTHROPLASTY;  Surgeon: Carole Civil, MD;  Location: [facility];  Service: Orthopedics;  Laterality: Left;       Family History  Problem Relation Age of Onset   Heart attack Mother        fatal, cause of death   Heart attack Maternal Grandmother     Social History   Tobacco Use   Smoking status: Never Smoker   Smokeless tobacco: Never Used  Substance Use Topics   Alcohol use: Yes    Comment: wine on occasion   Drug use: No    Home Medications Prior to Admission medications   Medication Sig Start Date End Date Taking? Authorizing Provider  acetaminophen (TYLENOL) 500 MG tablet Take 1,000 mg by mouth every 6 (six) hours  as needed for mild pain or moderate pain.    Yes [provider]  amLODipine (NORVASC) 10 MG tablet Take 10 mg by mouth daily.  05/24/17  Yes [provider]  B Complex Vitamins (VITAMIN B-COMPLEX) TABS Take 1 tablet by mouth daily.    Yes [provider]  fluticasone (FLONASE) 50 MCG/ACT nasal spray Place 2 sprays into both nostrils at bedtime.  11/09/18  Yes [provider]  gabapentin (NEURONTIN) 100 MG capsule Take 100-300 mg by mouth at bedtime as needed (for pain).  01/01/20  Yes [provider]  gabapentin (NEURONTIN) 600 MG tablet Take 1,200 mg by mouth at bedtime.  02/03/18  Yes [provider]  glipiZIDE  (GLUCOTROL XL) 5 MG 24 hr tablet Take 5 mg by mouth 2 (two) times daily.    Yes [provider]  Glucosamine-Chondroit-Vit C-Mn (GLUCOSAMINE 1500 COMPLEX PO) Take 1 tablet by mouth daily.    Yes [provider]  meclizine (ANTIVERT) 25 MG tablet Take 1 tablet (25 mg total) by mouth 3 (three) times daily as needed for dizziness. may cause drowsiness 01/26/20  Yes Triplett, Tammy, PA-C  Melatonin 10 MG CAPS Take 10 mg by mouth at bedtime.    Yes [provider]  metFORMIN (GLUCOPHAGE) 500 MG tablet Take 500 mg by mouth 4 (four) times daily.    Yes [provider]  olmesartan (BENICAR) 40 MG tablet Take 40 mg by mouth daily.  01/28/18  Yes [provider]  atorvastatin (LIPITOR) 10 MG tablet Take 10 mg by mouth daily. Patient not taking: Reported on 04/09/2020    [provider]  HYDROcodone-acetaminophen (NORCO/VICODIN) 5-325 MG tablet Take 1 tablet by mouth every 4 (four) hours as needed. 04/09/20   Isla Pence, MD  mupirocin ointment (BACTROBAN) 2 % Apply 1 application topically 3 times/day as needed-between meals & bedtime.  Patient not taking: Reported on 04/09/2020 10/22/19   [provider]    Allergies    Penicillins  Review of Systems   Review of Systems  HENT: Positive for facial swelling.   Eyes: Positive for visual disturbance.  Musculoskeletal:       Right knee pain  All other systems reviewed and are negative.   Physical Exam Updated Vital Signs BP (!) 169/95 (BP Location: Right Leg)    Pulse 68    Temp 98.9 F (37.2 C) (Oral)    Ht 5\' 11"  (1.803 m)    Wt 123.8 kg    SpO2 98%    BMI 38.08 kg/m   Physical Exam Vitals and nursing note reviewed.  Constitutional:      Appearance: Normal appearance.  HENT:     Head:      Right Ear: External ear normal.     Left Ear: External ear normal.     Mouth/Throat:     Mouth: Mucous membranes are moist.     Pharynx: Oropharynx is clear.  Eyes:     General: Vision  grossly intact. Gaze aligned appropriately.     Extraocular Movements: Extraocular movements intact.     Conjunctiva/sclera: Conjunctivae normal.     Pupils: Pupils are equal, round, and reactive to light.     Right eye: No corneal abrasion or fluorescein uptake.     Left eye: No corneal abrasion or fluorescein uptake.     Visual Fields: Right eye visual fields normal and left eye visual fields normal.     Comments: No pain with EOM.  EOMI.  Cardiovascular:  Rate and Rhythm: Normal rate and regular rhythm.     Pulses: Normal pulses.     Heart sounds: Normal heart sounds.  Pulmonary:     Effort: Pulmonary effort is normal.     Breath sounds: Normal breath sounds.  Abdominal:     General: Abdomen is flat. Bowel sounds are normal.     Palpations: Abdomen is soft.  Musculoskeletal:     Cervical back: Normal range of motion and neck supple.       Legs:  Skin:    General: Skin is warm.     Capillary Refill: Capillary refill takes less than 2 seconds.  Neurological:     General: No focal deficit present.     Mental Status: He is alert and oriented to person, place, and time.  Psychiatric:        Mood and Affect: Mood normal.        Behavior: Behavior normal.     ED Results / Procedures / Treatments   Labs (all labs ordered are listed, but only abnormal results are displayed) Labs Reviewed - No data to display  EKG None  Radiology DG Chest 2 View  Result Date: 04/09/2020 CLINICAL DATA:  Fall EXAM: CHEST - 2 VIEW COMPARISON:  01/26/2020 FINDINGS: Cardiomegaly. Linear areas of scarring in the lung bases. No effusions or edema. No acute bony abnormality. IMPRESSION: Cardiomegaly.  Bibasilar scarring.  No active disease. Electronically Signed   By: Rolm Baptise M.D.   On: 04/09/2020 20:11   CT Head Wo Contrast  Result Date: 04/09/2020 CLINICAL DATA:  Posttraumatic headache, facial abrasions, altered vision. EXAM: CT HEAD WITHOUT CONTRAST TECHNIQUE: Contiguous axial images were  obtained from the base of the skull through the vertex without intravenous contrast. COMPARISON:  None. FINDINGS: Brain: The brainstem, cerebellum, cerebral peduncles, thalami, basal ganglia, basilar cisterns, and ventricular system appear within normal limits. Periventricular white matter and corona radiata hypodensities favor chronic ischemic microvascular white matter disease. No intracranial hemorrhage, mass lesion, or acute CVA. Vascular: Atherosclerotic calcification of the cavernous carotid arteries and in the middle cerebral arteries. Skull: No calvarial fracture is identified. Sinuses/Orbits: Right orbital floor fracture with blood filling much of the right maxillary sinus. Please see dedicated maxillofacial CT report. Mild mucosal thickening in the ethmoid air cells. Right periorbital preseptal soft tissue swelling. Other: No supplemental non-categorized findings. IMPRESSION: 1. Right orbital floor fracture. Right periorbital preseptal soft tissue swelling. Please see dedicated maxillofacial CT. 2. No acute intracranial findings. 3. Periventricular white matter and corona radiata hypodensities favor chronic ischemic microvascular white matter disease. Electronically Signed   By: Van Clines M.D.   On: 04/09/2020 20:21   CT Cervical Spine Wo Contrast  Result Date: 04/09/2020 CLINICAL DATA:  Fall with facial injury, right orbital fracture. EXAM: CT CERVICAL SPINE WITHOUT CONTRAST TECHNIQUE: Multidetector CT imaging of the cervical spine was performed without intravenous contrast. Multiplanar CT image reconstructions were also generated. COMPARISON:  None. FINDINGS: Alignment: No vertebral subluxation is observed. Skull base and vertebrae: No fracture or acute bony findings. Soft tissues and spinal canal: Mild bilateral carotid bulb atherosclerotic calcification. Disc levels: Mild bilateral osseous foraminal impingement at C5-6 due to uncinate spurring. Upper chest: Mild scarring in the apical  segment right upper lobe. Other: No supplemental non-categorized findings. IMPRESSION: 1. No cervical spine fracture or acute subluxation. 2. Mild bilateral osseous foraminal impingement at C5-6 due to uncinate spurring. 3. Mild bilateral carotid bulb atherosclerotic calcification. 4. Mild scarring in the apical segment right upper  lobe. Electronically Signed   By: Van Clines M.D.   On: 04/09/2020 20:32   DG Knee Complete 4 Views Right  Result Date: 04/09/2020 CLINICAL DATA:  Fall EXAM: RIGHT KNEE - COMPLETE 4+ VIEW COMPARISON:  None. FINDINGS: No evidence of fracture, dislocation, or joint effusion. No evidence of arthropathy or other focal bone abnormality. Soft tissues are unremarkable. IMPRESSION: Negative. Electronically Signed   By: Rolm Baptise M.D.   On: 04/09/2020 20:11   CT Maxillofacial Wo Contrast  Result Date: 04/09/2020 CLINICAL DATA:  Fall today, right periorbital bruising and altered vision. EXAM: CT MAXILLOFACIAL WITHOUT CONTRAST TECHNIQUE: Multidetector CT imaging of the maxillofacial structures was performed. Multiplanar CT image reconstructions were also generated. COMPARISON:  None. FINDINGS: Osseous: Segmental right orbital floor fracture with mild trapdoor fragment as shown on image 54/8. No extraocular muscle herniation or significant herniation of orbital adipose tissue. Orbits: There is potentially a small amount of blood products below the inferior rectus muscle but no significant intraconal abnormality is observed. Sinuses: Subtotal opacification of the right maxillary sinus likely but blood products. Mild mucosal thickening in the ethmoid air cells. Minimal chronic left maxillary sinusitis. Soft tissues: Right periorbital hematoma, preseptal in location. Mild facial soft tissue swelling anterior to the right maxilla. Limited intracranial: Please see dedicated CT head report. IMPRESSION: 1. Segmental right orbital floor fracture with mild trapdoor fragment. No extraocular  muscle herniation or significant herniation of orbital adipose tissue. 2. Right periorbital hematoma, preseptal in location. 3. Subtotal opacification of the right maxillary sinus likely but blood products. 4. Minimal chronic left maxillary sinusitis. Electronically Signed   By: Van Clines M.D.   On: 04/09/2020 20:25    Procedures Procedures (including critical care time)  Medications Ordered in ED Medications  fluorescein ophthalmic strip 1 strip (1 strip Both Eyes Given by Other 04/09/20 2110)  tetracaine (PONTOCAINE) 0.5 % ophthalmic solution 2 drop (2 drops Both Eyes Given by Other 04/09/20 2111)    ED Course  I have reviewed the triage vital signs and the nursing notes.  Pertinent labs & imaging results that were available during my care of the patient were reviewed by me and considered in my medical decision making (see chart for details).    MDM Rules/Calculators/A&P                          Pt has an orbital floor fx.  His vision is 20/30 on the right (pt said he only missed 1 letter on the 20/20 line) and 20/10 on the left with his glasses.  Pt is instructed to f/u with ENT and to return if worse.   Final Clinical Impression(s) / ED Diagnoses Final diagnoses:  Closed fracture of right orbital floor, initial encounter [facility])  Fall, initial encounter  Contusion of face, initial encounter  Contusion of right knee, initial encounter    Rx / DC Orders ED Discharge Orders         Ordered    HYDROcodone-acetaminophen (NORCO/VICODIN) 5-325 MG tablet  Every 4 hours PRN     Discontinue  Reprint     04/09/20 2051           Isla Pence, MD 04/09/20 2114

## 2020-04-09 NOTE — ED Notes (Signed)
Pt return to room. Wife at bedside

## 2020-04-09 NOTE — ED Notes (Signed)
Pt reports no changes in vision.

## 2020-04-09 NOTE — ED Notes (Addendum)
20/10 left eye 20/30 right eye  With glasses

## 2020-04-09 NOTE — ED Triage Notes (Signed)
Pt states he tripped over a rail road tie around noon. He has bruising to the right eye and abrasions to the nose. Pt states he has a blue tint to vision in left eye and yellow/orange tint out of right eye.

## 2020-04-14 ENCOUNTER — Ambulatory Visit: Payer: Medicare Other

## 2020-04-14 ENCOUNTER — Ambulatory Visit (INDEPENDENT_AMBULATORY_CARE_PROVIDER_SITE_OTHER): Payer: Medicare Other | Admitting: Orthopedic Surgery

## 2020-04-14 ENCOUNTER — Encounter: Payer: Self-pay | Admitting: Orthopedic Surgery

## 2020-04-14 ENCOUNTER — Other Ambulatory Visit: Payer: Self-pay

## 2020-04-14 VITALS — BP 166/88 | HR 73 | Ht 71.5 in | Wt 267.0 lb

## 2020-04-14 DIAGNOSIS — M1712 Unilateral primary osteoarthritis, left knee: Secondary | ICD-10-CM

## 2020-04-14 NOTE — Progress Notes (Signed)
Progress Note   Patient ID: Brian [name], male   DOB: 1941/08/11, 79 y.o.   MRN: [id number]  Body mass index is 36.72 kg/m.  Chief Complaint  Patient presents with  . Post-op Follow-up    left knee replacement 04/14/16     Encounter Diagnosis  Name Primary?  . Unilateral primary osteoarthritis, left knee Yes    79 year old male comes in for annual follow-up x-ray of his left total knee.  He says it is functioning well  However, he fell a few days ago injured his right orbit scraped up his right knee      Review of Systems  Constitutional: Negative for fever.  Eyes: Positive for blurred vision.  Musculoskeletal: Positive for joint pain.  Neurological: Negative for tingling.       BP (!) 166/88   Pulse 73   Ht 5' 11.5" (1.816 m)   Wt 267 lb (121.1 kg)   BMI 36.72 kg/m   Physical Exam Right knee has an abrasion over the superior aspect just superior to the patella he does have full range of motion he appears to have a 2+ posterior drawer sign with firm endpoint but no pain lateral ligaments are otherwise stable joint lines are nontender no effusion  Left total knee full extension flexion is 125 degrees, knee is stable  MEDICAL DECISION MAKING  A.  Encounter Diagnosis  Name Primary?  . Unilateral primary osteoarthritis, left knee Yes    B. DATA ANALYSED:  IMAGING: Independent interpretation of images: X-rays of his right knee at the hospital were negative  Left total knee x-ray normal    Outside records reviewed: Emergency room  C. MANAGEMENT  Stable left total knee no treatment needed  Abrasion contusion right knee  No further treatment needed  No orders of the defined types were placed in this encounter.  Acute uncomplicated injury contusion right knee  Stable chronic left knee, stable total knee reached all treatment goals  Independent x-ray review    Arther Abbott, MD 04/14/2020 11:04 AM

## 2020-04-17 DIAGNOSIS — H6121 Impacted cerumen, right ear: Secondary | ICD-10-CM | POA: Diagnosis not present

## 2020-05-08 ENCOUNTER — Other Ambulatory Visit: Payer: Self-pay

## 2020-05-08 ENCOUNTER — Inpatient Hospital Stay (HOSPITAL_COMMUNITY): Payer: Medicare Other | Attending: Hematology

## 2020-05-08 DIAGNOSIS — E669 Obesity, unspecified: Secondary | ICD-10-CM | POA: Insufficient documentation

## 2020-05-08 DIAGNOSIS — K760 Fatty (change of) liver, not elsewhere classified: Secondary | ICD-10-CM | POA: Diagnosis not present

## 2020-05-08 DIAGNOSIS — E119 Type 2 diabetes mellitus without complications: Secondary | ICD-10-CM | POA: Diagnosis not present

## 2020-05-08 DIAGNOSIS — D693 Immune thrombocytopenic purpura: Secondary | ICD-10-CM | POA: Insufficient documentation

## 2020-05-08 DIAGNOSIS — D696 Thrombocytopenia, unspecified: Secondary | ICD-10-CM

## 2020-05-08 DIAGNOSIS — Z79899 Other long term (current) drug therapy: Secondary | ICD-10-CM | POA: Diagnosis not present

## 2020-05-08 LAB — COMPREHENSIVE METABOLIC PANEL
ALT: 18 U/L (ref 0–44)
AST: 17 U/L (ref 15–41)
Albumin: 4.1 g/dL (ref 3.5–5.0)
Alkaline Phosphatase: 53 U/L (ref 38–126)
Anion gap: 9 (ref 5–15)
BUN: 19 mg/dL (ref 8–23)
CO2: 22 mmol/L (ref 22–32)
Calcium: 9.1 mg/dL (ref 8.9–10.3)
Chloride: 106 mmol/L (ref 98–111)
Creatinine, Ser: 0.9 mg/dL (ref 0.61–1.24)
GFR calc Af Amer: >60 mL/min (ref >60)
GFR calc non Af Amer: >60 mL/min (ref >60)
Glucose, Bld: 155 mg/dL — ABNORMAL HIGH (ref 70–99)
Potassium: 3.8 mmol/L (ref 3.5–5.1)
Sodium: 137 mmol/L (ref 135–145)
Total Bilirubin: 0.6 mg/dL (ref 0.3–1.2)
Total Protein: 7.1 g/dL (ref 6.5–8.1)

## 2020-05-08 LAB — CBC WITH DIFFERENTIAL/PLATELET
Abs Immature Granulocytes: 0.02 10*3/uL (ref 0.00–0.07)
Basophils Absolute: 0 10*3/uL (ref 0.0–0.1)
Basophils Relative: 0 %
Eosinophils Absolute: 0.3 10*3/uL (ref 0.0–0.5)
Eosinophils Relative: 5 %
HCT: 43 % (ref 39.0–52.0)
Hemoglobin: 14.8 g/dL (ref 13.0–17.0)
Immature Granulocytes: 0 %
Lymphocytes Relative: 31 %
Lymphs Abs: 2.1 10*3/uL (ref 0.7–4.0)
MCH: 31.4 pg (ref 26.0–34.0)
MCHC: 34.4 g/dL (ref 30.0–36.0)
MCV: 91.3 fL (ref 80.0–100.0)
Monocytes Absolute: 0.6 10*3/uL (ref 0.1–1.0)
Monocytes Relative: 8 %
Neutro Abs: 3.8 10*3/uL (ref 1.7–7.7)
Neutrophils Relative %: 56 %
Platelets: 62 10*3/uL — ABNORMAL LOW (ref 150–400)
RBC: 4.71 MIL/uL (ref 4.22–5.81)
RDW: 12.2 % (ref 11.5–15.5)
WBC: 6.8 10*3/uL (ref 4.0–10.5)
nRBC: 0 % (ref 0.0–0.2)

## 2020-05-08 LAB — VITAMIN B12: Vitamin B-12: 249 pg/mL (ref 180–914)

## 2020-05-08 LAB — LACTATE DEHYDROGENASE: LDH: 122 U/L (ref 98–192)

## 2020-05-08 LAB — FOLATE: Folate: 34.8 ng/mL (ref >5.9)

## 2020-05-12 ENCOUNTER — Inpatient Hospital Stay (HOSPITAL_BASED_OUTPATIENT_CLINIC_OR_DEPARTMENT_OTHER): Payer: Medicare Other | Admitting: Hematology

## 2020-05-12 VITALS — BP 169/84 | HR 72 | Temp 96.9°F | Resp 20 | Wt 268.3 lb

## 2020-05-12 DIAGNOSIS — E669 Obesity, unspecified: Secondary | ICD-10-CM | POA: Diagnosis not present

## 2020-05-12 DIAGNOSIS — Z79899 Other long term (current) drug therapy: Secondary | ICD-10-CM | POA: Diagnosis not present

## 2020-05-12 DIAGNOSIS — D693 Immune thrombocytopenic purpura: Secondary | ICD-10-CM | POA: Diagnosis not present

## 2020-05-12 DIAGNOSIS — D696 Thrombocytopenia, unspecified: Secondary | ICD-10-CM | POA: Diagnosis not present

## 2020-05-12 DIAGNOSIS — K760 Fatty (change of) liver, not elsewhere classified: Secondary | ICD-10-CM | POA: Diagnosis not present

## 2020-05-12 DIAGNOSIS — E119 Type 2 diabetes mellitus without complications: Secondary | ICD-10-CM | POA: Diagnosis not present

## 2020-05-12 NOTE — Patient Instructions (Signed)
[facility] °Discharge Instructions ° °You were seen today by Dr. Katragadda. He went over your recent results. Dr. Katragadda will see you back in 4 months for labs and follow up. ° ° °Thank you for choosing [facility] at [facility] to provide your oncology and hematology care.  To afford each patient quality time with our provider, please arrive at least 15 minutes before your scheduled appointment time.  ° °If you have a lab appointment with the Cancer Center please come in thru the Main Entrance and check in at the main information desk ° °You need to re-schedule your appointment should you arrive 10 or more minutes late.  We strive to give you quality time with our providers, and arriving late affects you and other patients whose appointments are after yours.  Also, if you no show three or more times for appointments you may be dismissed from the clinic at the providers discretion.     °Again, thank you for choosing [facility].  Our hope is that these requests will decrease the amount of time that you wait before being seen by our physicians.       °_____________________________________________________________ ° °Should you have questions after your visit to [facility], please contact our office at [phone] between the hours of 8:00 a.m. and 4:30 p.m.  Voicemails left after 4:00 p.m. will not be returned until the following business day.  For prescription refill requests, have your pharmacy contact our office and allow 72 hours.   ° °Cancer Center Support Programs:  ° °> Cancer Support Group  °2nd Tuesday of the month 1pm-2pm, Journey Room  ° ° °

## 2020-05-12 NOTE — Progress Notes (Signed)
Brian [name], [place] [id number]   CLINIC:  Medical Oncology/Hematology  PCP:  Celene Squibb, MD [address] [name] [address]  [phone]  REASON FOR VISIT:  Follow-up for thrombocytopenia  PRIOR THERAPY: None  CURRENT THERAPY: Observation  INTERVAL HISTORY:  Brian [name], a 79 y.o. male, returns for routine follow-up for his thrombocytopenia. Brian [name] was last seen on 03/04/2020.  Today he reports that he fell and had an orbital fracture behind his right eye on 04/16/2020. He is being followed by ENT for his orbital fracture. He has not taken prednisone recently. He is currently taking doxycycline BID for a boil on his buttocks; he has 8 more days left. He denies having any nosebleeds.   REVIEW OF SYSTEMS:  Review of Systems  Constitutional: Positive for fatigue (moderate). Negative for appetite change.  HENT:   Negative for nosebleeds.   Respiratory: Positive for cough (in the AM).   Neurological: Positive for dizziness and numbness (feet).  All other systems reviewed and are negative.   PAST MEDICAL/SURGICAL HISTORY:  Past Medical History:  Diagnosis Date  . Arthritis   . Atypical chest pain    Low-risk exercise stress Cardiolite EF 60% April 2008. Reportedly normal remote cardiac catheterization @ [facility] EF 60%...nuclear....2008  . Bradycardia    hospital 03/2010 stopped beta blocker  . Complication of anesthesia   . Diabetes mellitus without complication ([facility])   . Dyslipidemia   . Hypertension   . Mild aortic regurgitation with LV dilation by prior echocardiogram   . Obesity   . Obstructive sleep apnea    C-Pap compliant  . PONV (postoperative nausea and vomiting)   . Thrombocytopenia (HCC)    mild follow Dr. Alonna Minium  . Vertigo    Hospital 03/2010   Past Surgical History:  Procedure Laterality Date  . CARDIAC CATHETERIZATION    . KNEE ARTHROSCOPY WITH MEDIAL MENISECTOMY Left 05/31/2014   Procedure: LEFT  KNEE ARTHROSCOPY WITH MEDIAL MENISECTOMY;  Surgeon: Carole Civil, MD;  Location: [facility];  Service: Orthopedics;  Laterality: Left;  . Status post bilateral arthroscopic knee surgery    . 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 Marital status: Married    Spouse name: Not on file  . Number of children: 3  . Years of education: Not on file  . Highest education level: Not on file  Occupational History  . Not on file  Tobacco Use  . Smoking status: Never Smoker  . Smokeless tobacco: Never Used  Substance and Sexual Activity  . Alcohol use: Yes    Comment: wine on occasion  . Drug use: No  . Sexual activity: Not on file  Other Topics Concern  . Not on file  Social History Narrative  . Not on file   Social Determinants of Health   Financial Resource Strain:   . Difficulty of Paying Living Expenses:   Food Insecurity:   . Worried About Charity fundraiser in the Last Year:   . Arboriculturist in the Last Year:   Transportation Needs:   . Film/video editor (Medical):   Marland Kitchen Lack of Transportation (Non-Medical):   Physical Activity:   . Days of Exercise per Week:   .  Minutes of Exercise per Session:   Stress:   . Feeling of Stress :   Social Connections:   . Frequency of Communication with Friends and Family:   . Frequency of Social Gatherings with Friends and Family:   . Attends Religious Services:   . Active Member of Clubs or Organizations:   . Attends Archivist Meetings:   Marland Kitchen Marital Status:   Intimate Partner Violence:   . Fear of Current or Ex-Partner:   . Emotionally Abused:   Marland Kitchen Physically Abused:   . Sexually Abused:     FAMILY HISTORY:  Family History  Problem Relation Age of Onset  . Heart attack Mother        fatal, cause of death  . Heart  attack Maternal Grandmother     CURRENT MEDICATIONS:  Current Outpatient Medications  Medication Sig Dispense Refill  . amLODipine (NORVASC) 10 MG tablet Take 10 mg by mouth daily.     . B Complex Vitamins (VITAMIN B-COMPLEX) TABS Take 1 tablet by mouth daily.     . fluticasone (FLONASE) 50 MCG/ACT nasal spray Place 2 sprays into both nostrils at bedtime.     . gabapentin (NEURONTIN) 600 MG tablet Take 1,200 mg by mouth at bedtime.     Marland Kitchen glipiZIDE (GLUCOTROL XL) 5 MG 24 hr tablet Take 5 mg by mouth 2 (two) times daily.     . Glucosamine-Chondroit-Vit C-Mn (GLUCOSAMINE 1500 COMPLEX PO) Take 1 tablet by mouth daily.     . Melatonin 10 MG CAPS Take 10 mg by mouth at bedtime.     . metFORMIN (GLUCOPHAGE) 500 MG tablet Take 500 mg by mouth 4 (four) times daily.     . mupirocin ointment (BACTROBAN) 2 % Apply 1 application topically 3 times/day as needed-between meals & bedtime.     Marland Kitchen olmesartan (BENICAR) 40 MG tablet Take 40 mg by mouth daily.     Marland Kitchen acetaminophen (TYLENOL) 500 MG tablet Take 1,000 mg by mouth every 6 (six) hours as needed for mild pain or moderate pain.  (Patient not taking: Reported on 05/12/2020)    . atorvastatin (LIPITOR) 10 MG tablet Take 10 mg by mouth daily.  (Patient not taking: Reported on 05/12/2020)    . gabapentin (NEURONTIN) 100 MG capsule Take 100-300 mg by mouth at bedtime as needed (for pain).  (Patient not taking: Reported on 05/12/2020)    . HYDROcodone-acetaminophen (NORCO/VICODIN) 5-325 MG tablet Take 1 tablet by mouth every 4 (four) hours as needed. (Patient not taking: Reported on 05/12/2020) 15 tablet 0  . meclizine (ANTIVERT) 25 MG tablet Take 1 tablet (25 mg total) by mouth 3 (three) times daily as needed for dizziness. may cause drowsiness (Patient not taking: Reported on 05/12/2020) 21 tablet 0   No current facility-administered medications for this visit.    ALLERGIES:  Allergies  Allergen Reactions  . Penicillins Other (See Comments)    dizziness     PHYSICAL EXAM:  Performance status (ECOG): 1 - Symptomatic but completely ambulatory  Vitals:   05/12/20 1500  BP: (!) 169/84  Pulse: 72  Resp: 20  Temp: (!) 96.9 F (36.1 C)  SpO2: 96%   Wt Readings from Last 3 Encounters:  05/12/20 268 lb 4.8 oz (121.7 kg)  04/14/20 267 lb (121.1 kg)  04/09/20 273 lb (123.8 kg)   Physical Exam Vitals reviewed.  Constitutional:      Appearance: Normal appearance. He is obese.  Cardiovascular:     Rate and Rhythm: Normal  rate and regular rhythm.     Pulses: Normal pulses.     Heart sounds: Normal heart sounds.  Pulmonary:     Effort: Pulmonary effort is normal.     Breath sounds: Normal breath sounds.  Abdominal:     Palpations: Abdomen is soft.     Tenderness: There is no abdominal tenderness.  Musculoskeletal:     Right lower leg: No edema.     Left lower leg: No edema.  Neurological:     General: No focal deficit present.     Mental Status: He is alert and oriented to person, place, and time.  Psychiatric:        Mood and Affect: Mood normal.        Behavior: Behavior normal.     LABORATORY DATA:  I have reviewed the labs as listed.  CBC Latest Ref Rng & Units 05/08/2020 02/22/2020 01/31/2020  WBC 4.0 - 10.5 K/uL 6.8 6.3 13.4(H)  Hemoglobin 13.0 - 17.0 g/dL 14.8 13.5 15.8  Hematocrit 39 - 52 % 43.0 39.2 45.0  Platelets 150 - 400 K/uL 62(L) 51(L) 108(L)   CMP Latest Ref Rng & Units 05/08/2020 02/22/2020 01/31/2020  Glucose 70 - 99 mg/dL 155(H) 170(H) 186(H)  BUN 8 - 23 mg/dL 19 30(H) 34(H)  Creatinine 0.61 - 1.24 mg/dL 0.90 1.14 1.02  Sodium 135 - 145 mmol/L 137 134(L) 134(L)  Potassium 3.5 - 5.1 mmol/L 3.8 3.8 3.8  Chloride 98 - 111 mmol/L 106 104 102  CO2 22 - 32 mmol/L 22 21(L) 22  Calcium 8.9 - 10.3 mg/dL 9.1 9.3 9.0  Total Protein 6.5 - 8.1 g/dL 7.1 6.6 6.4(L)  Total Bilirubin 0.3 - 1.2 mg/dL 0.6 0.7 0.7  Alkaline Phos 38 - 126 U/L 53 50 49  AST 15 - 41 U/L _0 ALT 0 - 44 U/L _1 Component Value  Date/Time   RBC 4.71 05/08/2020 0946   MCV 91.3 05/08/2020 0946   MCH 31.4 05/08/2020 0946   MCHC 34.4 05/08/2020 0946   RDW 12.2 05/08/2020 0946   LYMPHSABS 2.1 05/08/2020 0946   MONOABS 0.6 05/08/2020 0946   EOSABS 0.3 05/08/2020 0946   BASOSABS 0.0 05/08/2020 0946   Lab Results  Component Value Date   LDH 122 05/08/2020   LDH 147 02/22/2020   LDH 142 11/28/2019     DIAGNOSTIC IMAGING:  I have independently reviewed the scans and discussed with the patient. DG Knee AP/LAT W/Sunrise Left  Result Date: 04/14/2020 Radiology report 3 views left knee AP lateral and patellar sunrise views of the knee FINDINGS: A total knee prosthesis is noted. It is in anatomic alignment. There is no evidence of loosening. Impression : normal TKA  xray     ASSESSMENT:  1.  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  Bone marrow biopsy in 2009 was reportedly normal.  This was done in [place]. -CTAP showed fatty infiltration of the liver with normal spleen. -Work-up for nutritional deficiencies, plasma cell disorder was negative.  Infectious etiology was negative.  LDH was normal. -Platelet count dropped to 29 on 01/26/2020 and was treated with dexamethasone for 4 days. -Dexamethasone made to him have epigastric pain, indigestion, burping and hiccups. -Peak platelet count reached 108 on 01/31/2020. -As he cannot tolerate dexamethasone, options include Promacta.   PLAN:  1.  Immune mediated thrombocytopenia: -I reviewed CBC from 05/08/2020.  Platelet count is 62.  White count and  hemoglobin is normal. -C76 was 394, folic acid was normal.  LDH was normal.  CMP was normal. -I have recommended follow-up in 4 months with repeat labs.  He was told to come back sooner should he develop any bleeding or easy bruising.   Orders placed this encounter:  No orders of the defined types were placed in this encounter.    Derek Jack, MD [place] [phone]   I, Milinda Antis, am acting as a scribe for Dr. Sanda Linger.  I, Derek Jack MD, have reviewed the above documentation for accuracy and completeness, and I agree with the above.

## 2020-05-15 DIAGNOSIS — H25013 Cortical age-related cataract, bilateral: Secondary | ICD-10-CM | POA: Diagnosis not present

## 2020-05-19 DIAGNOSIS — L03114 Cellulitis of left upper limb: Secondary | ICD-10-CM | POA: Diagnosis not present

## 2020-05-19 DIAGNOSIS — L237 Allergic contact dermatitis due to plants, except food: Secondary | ICD-10-CM | POA: Diagnosis not present

## 2020-05-23 DIAGNOSIS — L03114 Cellulitis of left upper limb: Secondary | ICD-10-CM | POA: Diagnosis not present

## 2020-05-23 DIAGNOSIS — L237 Allergic contact dermatitis due to plants, except food: Secondary | ICD-10-CM | POA: Diagnosis not present

## 2020-05-26 DIAGNOSIS — L03114 Cellulitis of left upper limb: Secondary | ICD-10-CM | POA: Diagnosis not present

## 2020-05-26 DIAGNOSIS — L237 Allergic contact dermatitis due to plants, except food: Secondary | ICD-10-CM | POA: Diagnosis not present

## 2020-05-27 ENCOUNTER — Other Ambulatory Visit (HOSPITAL_COMMUNITY): Payer: Medicare Other

## 2020-06-03 ENCOUNTER — Ambulatory Visit (HOSPITAL_COMMUNITY): Payer: Medicare Other | Admitting: Hematology

## 2020-06-18 DIAGNOSIS — Z0001 Encounter for general adult medical examination with abnormal findings: Secondary | ICD-10-CM | POA: Diagnosis not present

## 2020-06-18 DIAGNOSIS — G4733 Obstructive sleep apnea (adult) (pediatric): Secondary | ICD-10-CM | POA: Diagnosis not present

## 2020-06-18 DIAGNOSIS — D696 Thrombocytopenia, unspecified: Secondary | ICD-10-CM | POA: Diagnosis not present

## 2020-06-18 DIAGNOSIS — L720 Epidermal cyst: Secondary | ICD-10-CM | POA: Diagnosis not present

## 2020-06-18 DIAGNOSIS — E782 Mixed hyperlipidemia: Secondary | ICD-10-CM | POA: Diagnosis not present

## 2020-06-18 DIAGNOSIS — E1165 Type 2 diabetes mellitus with hyperglycemia: Secondary | ICD-10-CM | POA: Diagnosis not present

## 2020-06-18 DIAGNOSIS — N182 Chronic kidney disease, stage 2 (mild): Secondary | ICD-10-CM | POA: Diagnosis not present

## 2020-06-18 DIAGNOSIS — E1122 Type 2 diabetes mellitus with diabetic chronic kidney disease: Secondary | ICD-10-CM | POA: Diagnosis not present

## 2020-06-18 DIAGNOSIS — G9009 Other idiopathic peripheral autonomic neuropathy: Secondary | ICD-10-CM | POA: Diagnosis not present

## 2020-06-18 DIAGNOSIS — E1140 Type 2 diabetes mellitus with diabetic neuropathy, unspecified: Secondary | ICD-10-CM | POA: Diagnosis not present

## 2020-06-18 DIAGNOSIS — R809 Proteinuria, unspecified: Secondary | ICD-10-CM | POA: Diagnosis not present

## 2020-06-18 DIAGNOSIS — I129 Hypertensive chronic kidney disease with stage 1 through stage 4 chronic kidney disease, or unspecified chronic kidney disease: Secondary | ICD-10-CM | POA: Diagnosis not present

## 2020-07-18 DIAGNOSIS — R809 Proteinuria, unspecified: Secondary | ICD-10-CM | POA: Diagnosis not present

## 2020-07-18 DIAGNOSIS — G4733 Obstructive sleep apnea (adult) (pediatric): Secondary | ICD-10-CM | POA: Diagnosis not present

## 2020-07-18 DIAGNOSIS — E1140 Type 2 diabetes mellitus with diabetic neuropathy, unspecified: Secondary | ICD-10-CM | POA: Diagnosis not present

## 2020-07-18 DIAGNOSIS — L720 Epidermal cyst: Secondary | ICD-10-CM | POA: Diagnosis not present

## 2020-07-18 DIAGNOSIS — G9009 Other idiopathic peripheral autonomic neuropathy: Secondary | ICD-10-CM | POA: Diagnosis not present

## 2020-07-18 DIAGNOSIS — D696 Thrombocytopenia, unspecified: Secondary | ICD-10-CM | POA: Diagnosis not present

## 2020-07-18 DIAGNOSIS — Z0001 Encounter for general adult medical examination with abnormal findings: Secondary | ICD-10-CM | POA: Diagnosis not present

## 2020-07-18 DIAGNOSIS — N182 Chronic kidney disease, stage 2 (mild): Secondary | ICD-10-CM | POA: Diagnosis not present

## 2020-07-18 DIAGNOSIS — I129 Hypertensive chronic kidney disease with stage 1 through stage 4 chronic kidney disease, or unspecified chronic kidney disease: Secondary | ICD-10-CM | POA: Diagnosis not present

## 2020-07-18 DIAGNOSIS — E782 Mixed hyperlipidemia: Secondary | ICD-10-CM | POA: Diagnosis not present

## 2020-07-18 DIAGNOSIS — E1122 Type 2 diabetes mellitus with diabetic chronic kidney disease: Secondary | ICD-10-CM | POA: Diagnosis not present

## 2020-07-18 DIAGNOSIS — E1165 Type 2 diabetes mellitus with hyperglycemia: Secondary | ICD-10-CM | POA: Diagnosis not present

## 2020-07-20 DIAGNOSIS — E1165 Type 2 diabetes mellitus with hyperglycemia: Secondary | ICD-10-CM | POA: Diagnosis not present

## 2020-07-20 DIAGNOSIS — S0081XA Abrasion of other part of head, initial encounter: Secondary | ICD-10-CM | POA: Diagnosis not present

## 2020-07-20 DIAGNOSIS — S0993XA Unspecified injury of face, initial encounter: Secondary | ICD-10-CM | POA: Diagnosis not present

## 2020-07-20 DIAGNOSIS — Z20822 Contact with and (suspected) exposure to covid-19: Secondary | ICD-10-CM | POA: Diagnosis not present

## 2020-07-20 DIAGNOSIS — Z79899 Other long term (current) drug therapy: Secondary | ICD-10-CM | POA: Diagnosis not present

## 2020-07-20 DIAGNOSIS — N390 Urinary tract infection, site not specified: Secondary | ICD-10-CM | POA: Diagnosis not present

## 2020-07-20 DIAGNOSIS — E1140 Type 2 diabetes mellitus with diabetic neuropathy, unspecified: Secondary | ICD-10-CM | POA: Diagnosis not present

## 2020-07-20 DIAGNOSIS — I10 Essential (primary) hypertension: Secondary | ICD-10-CM | POA: Diagnosis not present

## 2020-07-20 DIAGNOSIS — R000 Tachycardia, unspecified: Secondary | ICD-10-CM | POA: Diagnosis not present

## 2020-07-20 DIAGNOSIS — Z7984 Long term (current) use of oral hypoglycemic drugs: Secondary | ICD-10-CM | POA: Diagnosis not present

## 2020-07-20 DIAGNOSIS — S0990XA Unspecified injury of head, initial encounter: Secondary | ICD-10-CM | POA: Diagnosis not present

## 2020-07-20 DIAGNOSIS — R0902 Hypoxemia: Secondary | ICD-10-CM | POA: Diagnosis not present

## 2020-07-20 DIAGNOSIS — R4020 Unspecified coma: Secondary | ICD-10-CM | POA: Diagnosis not present

## 2020-07-20 DIAGNOSIS — R0689 Other abnormalities of breathing: Secondary | ICD-10-CM | POA: Diagnosis not present

## 2020-07-20 DIAGNOSIS — R42 Dizziness and giddiness: Secondary | ICD-10-CM | POA: Diagnosis not present

## 2020-07-20 DIAGNOSIS — Z03818 Encounter for observation for suspected exposure to other biological agents ruled out: Secondary | ICD-10-CM | POA: Diagnosis not present

## 2020-07-20 DIAGNOSIS — G4730 Sleep apnea, unspecified: Secondary | ICD-10-CM | POA: Diagnosis not present

## 2020-07-20 DIAGNOSIS — R58 Hemorrhage, not elsewhere classified: Secondary | ICD-10-CM | POA: Diagnosis not present

## 2020-07-28 DIAGNOSIS — R42 Dizziness and giddiness: Secondary | ICD-10-CM | POA: Diagnosis not present

## 2020-07-28 DIAGNOSIS — W19XXXD Unspecified fall, subsequent encounter: Secondary | ICD-10-CM | POA: Diagnosis not present

## 2020-07-28 DIAGNOSIS — G3184 Mild cognitive impairment, so stated: Secondary | ICD-10-CM | POA: Diagnosis not present

## 2020-07-28 DIAGNOSIS — R32 Unspecified urinary incontinence: Secondary | ICD-10-CM | POA: Diagnosis not present

## 2020-07-28 DIAGNOSIS — G9009 Other idiopathic peripheral autonomic neuropathy: Secondary | ICD-10-CM | POA: Diagnosis not present

## 2020-07-29 DIAGNOSIS — H8110 Benign paroxysmal vertigo, unspecified ear: Secondary | ICD-10-CM | POA: Diagnosis not present

## 2020-07-29 DIAGNOSIS — G629 Polyneuropathy, unspecified: Secondary | ICD-10-CM | POA: Diagnosis not present

## 2020-07-29 DIAGNOSIS — M542 Cervicalgia: Secondary | ICD-10-CM | POA: Diagnosis not present

## 2020-07-29 DIAGNOSIS — L0232 Furuncle of buttock: Secondary | ICD-10-CM | POA: Diagnosis not present

## 2020-07-29 DIAGNOSIS — Z96651 Presence of right artificial knee joint: Secondary | ICD-10-CM | POA: Diagnosis not present

## 2020-07-29 DIAGNOSIS — R21 Rash and other nonspecific skin eruption: Secondary | ICD-10-CM | POA: Diagnosis not present

## 2020-07-29 DIAGNOSIS — Z6836 Body mass index (BMI) 36.0-36.9, adult: Secondary | ICD-10-CM | POA: Diagnosis not present

## 2020-07-29 DIAGNOSIS — H9201 Otalgia, right ear: Secondary | ICD-10-CM | POA: Diagnosis not present

## 2020-07-29 DIAGNOSIS — H6121 Impacted cerumen, right ear: Secondary | ICD-10-CM | POA: Diagnosis not present

## 2020-07-29 DIAGNOSIS — L723 Sebaceous cyst: Secondary | ICD-10-CM | POA: Diagnosis not present

## 2020-07-29 DIAGNOSIS — G3184 Mild cognitive impairment, so stated: Secondary | ICD-10-CM | POA: Diagnosis not present

## 2020-07-29 DIAGNOSIS — R32 Unspecified urinary incontinence: Secondary | ICD-10-CM | POA: Diagnosis not present

## 2020-08-30 DIAGNOSIS — Z23 Encounter for immunization: Secondary | ICD-10-CM | POA: Diagnosis not present

## 2020-09-03 DIAGNOSIS — Z23 Encounter for immunization: Secondary | ICD-10-CM | POA: Diagnosis not present

## 2020-09-04 DIAGNOSIS — R21 Rash and other nonspecific skin eruption: Secondary | ICD-10-CM | POA: Diagnosis not present

## 2020-09-04 DIAGNOSIS — Z6836 Body mass index (BMI) 36.0-36.9, adult: Secondary | ICD-10-CM | POA: Diagnosis not present

## 2020-09-04 DIAGNOSIS — H6121 Impacted cerumen, right ear: Secondary | ICD-10-CM | POA: Diagnosis not present

## 2020-09-04 DIAGNOSIS — L0232 Furuncle of buttock: Secondary | ICD-10-CM | POA: Diagnosis not present

## 2020-09-04 DIAGNOSIS — R32 Unspecified urinary incontinence: Secondary | ICD-10-CM | POA: Diagnosis not present

## 2020-09-04 DIAGNOSIS — G629 Polyneuropathy, unspecified: Secondary | ICD-10-CM | POA: Diagnosis not present

## 2020-09-04 DIAGNOSIS — G3184 Mild cognitive impairment, so stated: Secondary | ICD-10-CM | POA: Diagnosis not present

## 2020-09-04 DIAGNOSIS — Z96651 Presence of right artificial knee joint: Secondary | ICD-10-CM | POA: Diagnosis not present

## 2020-09-04 DIAGNOSIS — H9201 Otalgia, right ear: Secondary | ICD-10-CM | POA: Diagnosis not present

## 2020-09-04 DIAGNOSIS — H8110 Benign paroxysmal vertigo, unspecified ear: Secondary | ICD-10-CM | POA: Diagnosis not present

## 2020-09-04 DIAGNOSIS — L723 Sebaceous cyst: Secondary | ICD-10-CM | POA: Diagnosis not present

## 2020-09-04 DIAGNOSIS — M542 Cervicalgia: Secondary | ICD-10-CM | POA: Diagnosis not present

## 2020-09-08 DIAGNOSIS — E083293 Diabetes mellitus due to underlying condition with mild nonproliferative diabetic retinopathy without macular edema, bilateral: Secondary | ICD-10-CM | POA: Diagnosis not present

## 2020-09-09 DIAGNOSIS — R809 Proteinuria, unspecified: Secondary | ICD-10-CM | POA: Diagnosis not present

## 2020-09-09 DIAGNOSIS — D696 Thrombocytopenia, unspecified: Secondary | ICD-10-CM | POA: Diagnosis not present

## 2020-09-09 DIAGNOSIS — I129 Hypertensive chronic kidney disease with stage 1 through stage 4 chronic kidney disease, or unspecified chronic kidney disease: Secondary | ICD-10-CM | POA: Diagnosis not present

## 2020-09-09 DIAGNOSIS — G4733 Obstructive sleep apnea (adult) (pediatric): Secondary | ICD-10-CM | POA: Diagnosis not present

## 2020-09-09 DIAGNOSIS — E1165 Type 2 diabetes mellitus with hyperglycemia: Secondary | ICD-10-CM | POA: Diagnosis not present

## 2020-09-09 DIAGNOSIS — E782 Mixed hyperlipidemia: Secondary | ICD-10-CM | POA: Diagnosis not present

## 2020-09-09 DIAGNOSIS — E1140 Type 2 diabetes mellitus with diabetic neuropathy, unspecified: Secondary | ICD-10-CM | POA: Diagnosis not present

## 2020-09-09 DIAGNOSIS — Z9181 History of falling: Secondary | ICD-10-CM | POA: Diagnosis not present

## 2020-09-09 DIAGNOSIS — N182 Chronic kidney disease, stage 2 (mild): Secondary | ICD-10-CM | POA: Diagnosis not present

## 2020-09-09 DIAGNOSIS — G3184 Mild cognitive impairment, so stated: Secondary | ICD-10-CM | POA: Diagnosis not present

## 2020-09-09 DIAGNOSIS — R2689 Other abnormalities of gait and mobility: Secondary | ICD-10-CM | POA: Diagnosis not present

## 2020-09-09 DIAGNOSIS — G9009 Other idiopathic peripheral autonomic neuropathy: Secondary | ICD-10-CM | POA: Diagnosis not present

## 2020-09-16 DIAGNOSIS — D519 Vitamin B12 deficiency anemia, unspecified: Secondary | ICD-10-CM | POA: Diagnosis not present

## 2020-09-26 ENCOUNTER — Inpatient Hospital Stay (HOSPITAL_COMMUNITY): Payer: Medicare Other | Attending: Hematology

## 2020-09-26 ENCOUNTER — Other Ambulatory Visit: Payer: Self-pay

## 2020-09-26 DIAGNOSIS — E785 Hyperlipidemia, unspecified: Secondary | ICD-10-CM | POA: Insufficient documentation

## 2020-09-26 DIAGNOSIS — K30 Functional dyspepsia: Secondary | ICD-10-CM | POA: Insufficient documentation

## 2020-09-26 DIAGNOSIS — K760 Fatty (change of) liver, not elsewhere classified: Secondary | ICD-10-CM | POA: Insufficient documentation

## 2020-09-26 DIAGNOSIS — D696 Thrombocytopenia, unspecified: Secondary | ICD-10-CM | POA: Insufficient documentation

## 2020-09-26 DIAGNOSIS — R066 Hiccough: Secondary | ICD-10-CM | POA: Diagnosis not present

## 2020-09-26 DIAGNOSIS — E669 Obesity, unspecified: Secondary | ICD-10-CM | POA: Diagnosis not present

## 2020-09-26 DIAGNOSIS — Z7984 Long term (current) use of oral hypoglycemic drugs: Secondary | ICD-10-CM | POA: Insufficient documentation

## 2020-09-26 DIAGNOSIS — M129 Arthropathy, unspecified: Secondary | ICD-10-CM | POA: Insufficient documentation

## 2020-09-26 DIAGNOSIS — E1140 Type 2 diabetes mellitus with diabetic neuropathy, unspecified: Secondary | ICD-10-CM | POA: Diagnosis not present

## 2020-09-26 DIAGNOSIS — Z79899 Other long term (current) drug therapy: Secondary | ICD-10-CM | POA: Diagnosis not present

## 2020-09-26 DIAGNOSIS — R42 Dizziness and giddiness: Secondary | ICD-10-CM | POA: Insufficient documentation

## 2020-09-26 DIAGNOSIS — G4730 Sleep apnea, unspecified: Secondary | ICD-10-CM | POA: Insufficient documentation

## 2020-09-26 DIAGNOSIS — I10 Essential (primary) hypertension: Secondary | ICD-10-CM | POA: Diagnosis not present

## 2020-09-26 LAB — VITAMIN B12: Vitamin B-12: 276 pg/mL (ref 180–914)

## 2020-09-26 LAB — CBC WITH DIFFERENTIAL/PLATELET
Abs Immature Granulocytes: 0.03 10*3/uL (ref 0.00–0.07)
Basophils Absolute: 0 10*3/uL (ref 0.0–0.1)
Basophils Relative: 1 %
Eosinophils Absolute: 0.3 10*3/uL (ref 0.0–0.5)
Eosinophils Relative: 5 %
HCT: 40.5 % (ref 39.0–52.0)
Hemoglobin: 14 g/dL (ref 13.0–17.0)
Immature Granulocytes: 0 %
Lymphocytes Relative: 31 %
Lymphs Abs: 2.3 10*3/uL (ref 0.7–4.0)
MCH: 31.8 pg (ref 26.0–34.0)
MCHC: 34.6 g/dL (ref 30.0–36.0)
MCV: 92 fL (ref 80.0–100.0)
Monocytes Absolute: 0.5 10*3/uL (ref 0.1–1.0)
Monocytes Relative: 7 %
Neutro Abs: 4.2 10*3/uL (ref 1.7–7.7)
Neutrophils Relative %: 56 %
Platelets: 30 10*3/uL — ABNORMAL LOW (ref 150–400)
RBC: 4.4 MIL/uL (ref 4.22–5.81)
RDW: 12.7 % (ref 11.5–15.5)
WBC: 7.5 10*3/uL (ref 4.0–10.5)
nRBC: 0 % (ref 0.0–0.2)

## 2020-09-26 LAB — LACTATE DEHYDROGENASE: LDH: 140 U/L (ref 98–192)

## 2020-09-26 NOTE — Progress Notes (Signed)
[place], [place] [address]   CLINIC:  Medical Oncology/Hematology  PCP:  Celene Squibb, MD [address] [name] [address]  [phone]  REASON FOR VISIT:  Follow-up for thrombocytopenia  PRIOR THERAPY: None  CURRENT THERAPY: Observation  INTERVAL HISTORY:  [name], a 79 y.o. male, returns for routine follow-up for his thrombocytopenia. Brian [name] was last seen in July by Dr Raliegh Ip, He is doing well. Complains of falls and dysequilibrium given his neuropathy. He has an appointment with a neurologist. He denies any bleeding, notices some bruises on the skin. NO gingival bleed, epistaxis, petechiae or intractable headaches.  REVIEW OF SYSTEMS:  Review of Systems  Constitutional: Positive for fatigue (moderate). Negative for appetite change.  HENT:   Negative for nosebleeds.   Respiratory: Positive for cough (in the AM).   Neurological: Positive for dizziness and numbness (feet).  All other systems reviewed and are negative.   PAST MEDICAL/SURGICAL HISTORY:  Past Medical History:  Diagnosis Date  . Arthritis   . Atypical chest pain    Low-risk exercise stress Cardiolite EF 60% April 2008. Reportedly normal remote cardiac catheterization @ [facility] EF 60%...nuclear....2008  . Bradycardia    hospital 03/2010 stopped beta blocker  . Complication of anesthesia   . Diabetes mellitus without complication ([facility])   . Dyslipidemia   . Hypertension   . Mild aortic regurgitation with LV dilation by prior echocardiogram   . Obesity   . Obstructive sleep apnea    C-Pap compliant  . PONV (postoperative nausea and vomiting)   . Thrombocytopenia (HCC)    mild follow Dr. Alonna Minium  . Vertigo    Hospital 03/2010   Past Surgical History:  Procedure Laterality Date  . CARDIAC CATHETERIZATION    . KNEE ARTHROSCOPY WITH MEDIAL MENISECTOMY Left 05/31/2014   Procedure: LEFT KNEE ARTHROSCOPY WITH MEDIAL MENISECTOMY;  Surgeon: Carole Civil, MD;  Location: [facility];  Service: Orthopedics;  Laterality: Left;  . Status post bilateral arthroscopic knee surgery    . 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 Marital status: Married    Spouse name: Not on file  . Number of children: 3  . Years of education: Not on file  . Highest education level: Not on file  Occupational History  . Not on file  Tobacco Use  . Smoking status: Never Smoker  . Smokeless tobacco: Never Used  Substance and Sexual Activity  . Alcohol use: Yes    Comment: wine on occasion  . Drug use: No  . Sexual activity: Not on file  Other Topics Concern  . Not on file  Social History Narrative  . Not on file   Social Determinants of Health   Financial Resource Strain:   . Difficulty of Paying Living Expenses: Not on file  Food Insecurity:   . Worried About Charity fundraiser in the Last Year: Not on file  . Ran Out of Food in the Last Year: Not on file  Transportation Needs:   . Lack of Transportation (Medical): Not on file  . Lack of Transportation (Non-Medical): Not on file  Physical Activity:   . Days of Exercise per Week: Not on file  . Minutes of Exercise per Session: Not  on file  Stress:   . Feeling of Stress : Not on file  Social Connections:   . Frequency of Communication with Friends and Family: Not on file  . Frequency of Social Gatherings with Friends and Family: Not on file  . Attends Religious Services: Not on file  . Active Member of Clubs or Organizations: Not on file  . Attends Archivist Meetings: Not on file  . Marital Status: Not on file  Intimate Partner Violence:   . Fear of Current or Ex-Partner: Not on file  . Emotionally Abused: Not on file  . Physically Abused: Not on file  . Sexually Abused: Not on  file    FAMILY HISTORY:  Family History  Problem Relation Age of Onset  . Heart attack Mother        fatal, cause of death  . Heart attack Maternal Grandmother     CURRENT MEDICATIONS:  Current Outpatient Medications  Medication Sig Dispense Refill  . acetaminophen (TYLENOL) 500 MG tablet Take 1,000 mg by mouth every 6 (six) hours as needed for mild pain or moderate pain.  (Patient not taking: Reported on 05/12/2020)    . amLODipine (NORVASC) 10 MG tablet Take 10 mg by mouth daily.     Marland Kitchen atorvastatin (LIPITOR) 10 MG tablet Take 10 mg by mouth daily.  (Patient not taking: Reported on 05/12/2020)    . B Complex Vitamins (VITAMIN B-COMPLEX) TABS Take 1 tablet by mouth daily.     . fluticasone (FLONASE) 50 MCG/ACT nasal spray Place 2 sprays into both nostrils at bedtime.     . gabapentin (NEURONTIN) 100 MG capsule Take 100-300 mg by mouth at bedtime as needed (for pain).  (Patient not taking: Reported on 05/12/2020)    . gabapentin (NEURONTIN) 600 MG tablet Take 1,200 mg by mouth at bedtime.     Marland Kitchen glipiZIDE (GLUCOTROL XL) 5 MG 24 hr tablet Take 5 mg by mouth 2 (two) times daily.     . Glucosamine-Chondroit-Vit C-Mn (GLUCOSAMINE 1500 COMPLEX PO) Take 1 tablet by mouth daily.     Marland Kitchen HYDROcodone-acetaminophen (NORCO/VICODIN) 5-325 MG tablet Take 1 tablet by mouth every 4 (four) hours as needed. (Patient not taking: Reported on 05/12/2020) 15 tablet 0  . meclizine (ANTIVERT) 25 MG tablet Take 1 tablet (25 mg total) by mouth 3 (three) times daily as needed for dizziness. may cause drowsiness (Patient not taking: Reported on 05/12/2020) 21 tablet 0  . Melatonin 10 MG CAPS Take 10 mg by mouth at bedtime.     . metFORMIN (GLUCOPHAGE) 500 MG tablet Take 500 mg by mouth 4 (four) times daily.     . mupirocin ointment (BACTROBAN) 2 % Apply 1 application topically 3 times/day as needed-between meals & bedtime.     Marland Kitchen olmesartan (BENICAR) 40 MG tablet Take 40 mg by mouth daily.      No current  facility-administered medications for this visit.    ALLERGIES:  Allergies  Allergen Reactions  . Penicillins Other (See Comments)    dizziness    PHYSICAL EXAM:  Performance status (ECOG): 1 - Symptomatic but completely ambulatory  There were no vitals filed for this visit. Wt Readings from Last 3 Encounters:  05/12/20 268 lb 4.8 oz (121.7 kg)  04/14/20 267 lb (121.1 kg)  04/09/20 273 lb (123.8 kg)   Physical Exam Vitals reviewed.  Constitutional:      Appearance: Normal appearance. He is obese.  HENT:     Mouth/Throat:  Pharynx: Oropharynx is clear. No oropharyngeal exudate or posterior oropharyngeal erythema.  Cardiovascular:     Rate and Rhythm: Normal rate and regular rhythm.     Pulses: Normal pulses.     Heart sounds: Normal heart sounds.  Pulmonary:     Effort: Pulmonary effort is normal.     Breath sounds: Normal breath sounds.  Abdominal:     Palpations: Abdomen is soft.     Tenderness: There is no abdominal tenderness.  Musculoskeletal:     Right lower leg: No edema.     Left lower leg: No edema.  Skin:    General: Skin is warm.     Findings: No rash (No petechiae).  Neurological:     General: No focal deficit present.     Mental Status: He is alert and oriented to person, place, and time.  Psychiatric:        Mood and Affect: Mood normal.        Behavior: Behavior normal.     LABORATORY DATA:  I have reviewed the labs as listed.  CBC Latest Ref Rng & Units 09/26/2020 05/08/2020 02/22/2020  WBC 4.0 - 10.5 K/uL 7.5 6.8 6.3  Hemoglobin 13.0 - 17.0 g/dL 14.0 14.8 13.5  Hematocrit 39 - 52 % 40.5 43.0 39.2  Platelets 150 - 400 K/uL 30(L) 62(L) 51(L)   CMP Latest Ref Rng & Units 05/08/2020 02/22/2020 01/31/2020  Glucose 70 - 99 mg/dL 155(H) 170(H) 186(H)  BUN 8 - 23 mg/dL 19 30(H) 34(H)  Creatinine 0.61 - 1.24 mg/dL 0.90 1.14 1.02  Sodium 135 - 145 mmol/L 137 134(L) 134(L)  Potassium 3.5 - 5.1 mmol/L 3.8 3.8 3.8  Chloride 98 - 111 mmol/L 106 104 102    CO2 22 - 32 mmol/L 22 21(L) 22  Calcium 8.9 - 10.3 mg/dL 9.1 9.3 9.0  Total Protein 6.5 - 8.1 g/dL 7.1 6.6 6.4(L)  Total Bilirubin 0.3 - 1.2 mg/dL 0.6 0.7 0.7  Alkaline Phos 38 - 126 U/L 53 50 49  AST 15 - 41 U/L _0 ALT 0 - 44 U/L _1 Component Value Date/Time   RBC 4.40 09/26/2020 1037   MCV 92.0 09/26/2020 1037   MCH 31.8 09/26/2020 1037   MCHC 34.6 09/26/2020 1037   RDW 12.7 09/26/2020 1037   LYMPHSABS 2.3 09/26/2020 1037   MONOABS 0.5 09/26/2020 1037   EOSABS 0.3 09/26/2020 1037   BASOSABS 0.0 09/26/2020 1037   Lab Results  Component Value Date   LDH 140 09/26/2020   LDH 122 05/08/2020   LDH 147 02/22/2020     DIAGNOSTIC IMAGING:  I have independently reviewed the scans and discussed with the patient. No results found.   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  Bone marrow biopsy in 2009 was reportedly normal.  This was done in [place]. -CTAP showed fatty infiltration of the liver with normal spleen. -Work-up for nutritional deficiencies, plasma cell disorder was negative.  Infectious etiology was negative.  LDH was normal. -Platelet count dropped to 29 on 01/26/2020 and was treated with dexamethasone for 4 days. -Dexamethasone made to him have epigastric pain, indigestion, burping and hiccups. -Peak platelet count reached 108 on 01/31/2020. -As he cannot tolerate dexamethasone, options include IVIG/rituximab/TPO agonists if he relapses.   PLAN:  1.  Immune mediated thrombocytopenia: -I reviewed CBC from 09/26/2020, plt count is 30 K No active evidence of bleeding. I  dont recommend any treatment at this time since platelet count is 30 K and no evidence of bleeding. Can consider IVIG in the future for ITP management if he is intolerant to steroids. Recommended repeating CBC in 4/5 weeks and RTC at that time. Discussed symptoms and signs to watch for, he expressed  understanding.  Orders placed this encounter:  No orders of the defined types were placed in this encounter.   Benay Pike MD

## 2020-09-29 ENCOUNTER — Encounter (HOSPITAL_COMMUNITY): Payer: Self-pay | Admitting: Hematology and Oncology

## 2020-09-29 ENCOUNTER — Other Ambulatory Visit: Payer: Self-pay

## 2020-09-29 ENCOUNTER — Inpatient Hospital Stay (HOSPITAL_BASED_OUTPATIENT_CLINIC_OR_DEPARTMENT_OTHER): Payer: Medicare Other | Admitting: Hematology and Oncology

## 2020-09-29 VITALS — BP 178/92 | HR 75 | Temp 97.2°F | Resp 18 | Wt 261.6 lb

## 2020-09-29 DIAGNOSIS — M129 Arthropathy, unspecified: Secondary | ICD-10-CM | POA: Diagnosis not present

## 2020-09-29 DIAGNOSIS — D696 Thrombocytopenia, unspecified: Secondary | ICD-10-CM | POA: Diagnosis not present

## 2020-09-29 DIAGNOSIS — I10 Essential (primary) hypertension: Secondary | ICD-10-CM | POA: Diagnosis not present

## 2020-09-29 DIAGNOSIS — E1140 Type 2 diabetes mellitus with diabetic neuropathy, unspecified: Secondary | ICD-10-CM | POA: Diagnosis not present

## 2020-09-29 DIAGNOSIS — R42 Dizziness and giddiness: Secondary | ICD-10-CM | POA: Diagnosis not present

## 2020-09-29 DIAGNOSIS — E785 Hyperlipidemia, unspecified: Secondary | ICD-10-CM | POA: Diagnosis not present

## 2020-10-06 NOTE — Progress Notes (Signed)
[facility]    Provider:  Dr Jaynee Eagles Requesting Provider: Celene Squibb, MD Primary Care Provider:  Celene Squibb, MD  CC:  Multiple concerns  HPI:  Brian [name] is a 79 y.o. male here as requested by Celene Squibb, MD for dizziness and cognitive impairment and neuropathy; unfortunately it would be very difficult to cover all 3 of these in 1 appointment so I suggest we focus on cognitive impairment at least today. PMHx thrombocytopenia, arthritis, bradycardia, diabetes, dyslipidemia, hypertension, obesity, obstructive sleep apnea compliant with CPAP, follows with the cancer center for his thrombocytopenia.  I reviewed Dr. Juel Burrow notes: Having vertigo spells, fell at home, treated for UTI, wife was worried he had dementia, he has had several falls, with a fever and UTI.  He also has urinary incontinence.  He reports lightheadedness, dizziness, tremors.  Dr. Juel Burrow examination showed an obese well-nourished gentleman, able to answer questions, normal auscultation of the lungs, and heart, warm extremities normal pulses, no edema clubbing or cyanosis.  Further information includes episodes of odd behavior that could be related to early dementia, leaving doors open at home, the fridge door, some memory issues wife has noted, he is also seen Dr. Constance Holster in ENT for dizziness and vertigo, has meclizine which seems to help and they have been decreasing gabapentin, he also has idiopathic neuropathy but trying to get off of gabapentin because it is making him groggy in the morning.  If he is sitting on the floor with no support he just goes backwards. Started a bout a year ago. He has a lot of dizziness. He has neuropathy and he feels when he stands his legs are weak. He has leg weakness. It is a combination of his leg weakness, he gets wobbly getting up, once he is up he is fine, he may start to fall forward. No low back pain. Arms are not affected. He gets dizzy and falls back. He was on the  ground and couldn't pull himself up. She couldn't help him up. So arms and legs are weak. Dizziness is when he gets up, lightheadedness, usually when he stands up. Sitting down makes it go away most of the time. He has to hold on something to get up. He uses a cane. He gets tired easily and after a certain amou of walking legs feel tired, he can sit down and get back up and walk again and repeat the process. A grocery car helps him walk longer distances. Also confusion and memory problems, ataxia, falls. Wife also talks to me, says his memory is impaired, ongoing for more than a year, he has left the stove on, the fridge door open, she doesn't let him drive anymore. Will order formal memory testing.   Reviewed notes, labs and imaging from outside physicians, which showed:  I reviewed blood work from April 30, 2020 which showed CBC with low platelets otherwise unremarkable, CMP showed elevated glucose, BUN 19, creatinine 0.93, otherwise appeared normal.  Lipid panel showed elevated triglycerides 391, LDL of 80, HDL of 25, and a hemoglobin A1c of 7.  B12 low 249 05/08/2020 B12 276 09/26/2020  CT head and cervical spine 04/09/2020:  IMPRESSION: 1. Right orbital floor fracture. Right periorbital preseptal soft tissue swelling. Please see dedicated maxillofacial CT. 2. No acute intracranial findings. 3. Periventricular white matter and corona radiata hypodensities favor chronic ischemic microvascular white matter disease.  IMPRESSION: 1. No cervical spine fracture or acute subluxation. 2. Mild bilateral osseous foraminal impingement at  C5-6 due to uncinate spurring. 3. Mild bilateral carotid bulb atherosclerotic calcification. 4. Mild scarring in the apical segment right upper lobe.   Review of Systems: Patient complains of symptoms per HPI as well as the following symptoms: falls, weakness, confusion. Pertinent negatives and positives per HPI. All others negative.   Social History    Socioeconomic History  . Marital status: Married    Spouse name: Not on file  . Number of children: 3  . Years of education: Not on file  . Highest education level: Some college, no degree  Occupational History  . Not on file  Tobacco Use  . Smoking status: Never Smoker  . Smokeless tobacco: Never Used  Substance and Sexual Activity  . Alcohol use: Not Currently    Comment: wine on occasion  . Drug use: No  . Sexual activity: Not Currently  Other Topics Concern  . Not on file  Social History Narrative   Lives at home with wife   Right handed   Caffeine: Diet coke roughly 12 oz in a day    Social Determinants of Health   Financial Resource Strain: Low Risk   . Difficulty of Paying Living Expenses: Not hard at all  Food Insecurity: No Food Insecurity  . Worried About Charity fundraiser in the Last Year: Never true  . Ran Out of Food in the Last Year: Never true  Transportation Needs: No Transportation Needs  . Lack of Transportation (Medical): No  . Lack of Transportation (Non-Medical): No  Physical Activity: Inactive  . Days of Exercise per Week: 0 days  . Minutes of Exercise per Session: 0 min  Stress: No Stress Concern Present  . Feeling of Stress : Not at all  Social Connections: Moderately Integrated  . Frequency of Communication with Friends and Family: More than three times a week  . Frequency of Social Gatherings with Friends and Family: Three times a week  . Attends Religious Services: 1 to 4 times per year  . Active Member of Clubs or Organizations: No  . Attends Archivist Meetings: Never  . Marital Status: Married  Human resources officer Violence: Not At Risk  . Fear of Current or Ex-Partner: No  . Emotionally Abused: No  . Physically Abused: No  . Sexually Abused: No    Family History  Problem Relation Age of Onset  . Heart attack Mother        fatal, cause of death  . Heart attack Maternal Grandmother   . Memory loss Neg Hx     Past  Medical History:  Diagnosis Date  . Arthritis   . Atypical chest pain    Low-risk exercise stress Cardiolite EF 60% April 2008. Reportedly normal remote cardiac catheterization @ [place] EF 60%...nuclear....2008  . Bradycardia    hospital 03/2010 stopped beta blocker  . Complication of anesthesia   . Diabetes mellitus without complication ([place])   . Dyslipidemia   . Hypertension   . Mild aortic regurgitation with LV dilation by prior echocardiogram   . Neuropathy    in feet  . Obesity   . Obstructive sleep apnea    C-Pap compliant  . PONV (postoperative nausea and vomiting)   . Thrombocytopenia (HCC)    mild follow Dr. Alonna Minium  . Vertigo    Hospital 03/2010    Patient Active Problem List   Diagnosis Date Noted  . ETD (Eustachian tube dysfunction), bilateral 01/10/2018  . Bilateral impacted cerumen 07/19/2017  . Neck pain on  right side 07/19/2017  . Otalgia of right ear 07/19/2017  . Tendonitis, Achilles, right 08/27/2014  . Stiffness of joint, not elsewhere classified, lower leg 06/11/2014  . Pain in joint, lower leg 06/11/2014  . Abnormality of gait 06/11/2014  . 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 Derangement of posterior horn of medial meniscus 04/09/2014  . OA (osteoarthritis) of knee 04/09/2014  . Knee pain 04/09/2014  . THROMBOCYTOPENIA 05/12/2010  . AORTIC INSUFFICIENCY 05/12/2010  . OBESITY, UNSPECIFIED 05/11/2010  . HYPERTENSION 05/11/2010  . BRADYCARDIA 05/11/2010  . VERTIGO 05/11/2010  . UNSPECIFIED SLEEP APNEA 05/11/2010  . CHEST PAIN UNSPECIFIED 05/11/2010    Past Surgical History:  Procedure Laterality Date  . CARDIAC CATHETERIZATION    . KNEE ARTHROSCOPY WITH MEDIAL MENISECTOMY Left 05/31/2014   Procedure: LEFT KNEE ARTHROSCOPY WITH MEDIAL MENISECTOMY;  Surgeon: Carole Civil, MD;  Location: [facility];  Service: Orthopedics;  Laterality: Left;  . Status post bilateral arthroscopic knee surgery    . TOTAL KNEE ARTHROPLASTY Left 04/14/2016    Procedure: LEFT TOTAL KNEE ARTHROPLASTY;  Surgeon: Carole Civil, MD;  Location: [facility];  Service: Orthopedics;  Laterality: Left;    Current Outpatient Medications  Medication Sig Dispense Refill  . acetaminophen (TYLENOL) 500 MG tablet Take 1,000 mg by mouth every 6 (six) hours as needed for mild pain or moderate pain.     Marland Kitchen amLODipine (NORVASC) 10 MG tablet Take 10 mg by mouth daily.     . B Complex Vitamins (VITAMIN B-COMPLEX) TABS Take 1 tablet by mouth daily.     . cyanocobalamin (,VITAMIN B-12,) 1000 MCG/ML injection every 30 (thirty) days.    . fluticasone (FLONASE) 50 MCG/ACT nasal spray Place 2 sprays into both nostrils at bedtime.     . gabapentin (NEURONTIN) 100 MG capsule Take 100-300 mg by mouth at bedtime as needed (for pain).     Marland Kitchen glipiZIDE (GLUCOTROL XL) 5 MG 24 hr tablet Take 5 mg by mouth 2 (two) times daily.    . Glucosamine-Chondroit-Vit C-Mn (GLUCOSAMINE 1500 COMPLEX PO) Take 1 tablet by mouth daily.     . meclizine (ANTIVERT) 25 MG tablet Take 1 tablet (25 mg total) by mouth 3 (three) times daily as needed for dizziness. may cause drowsiness 21 tablet 0  . Melatonin 10 MG CAPS Take 10 mg by mouth at bedtime.     . metFORMIN (GLUCOPHAGE) 500 MG tablet Take 500 mg by mouth 4 (four) times daily.     Marland Kitchen olmesartan (BENICAR) 40 MG tablet Take 40 mg by mouth daily.     Marland Kitchen ALPRAZolam (XANAX) 0.25 MG tablet Take 1-2 tabs (0.25mg -0.50mg ) 30-60 minutes before procedure. May repeat if needed.Do not drive. 4 tablet 0  . mupirocin ointment (BACTROBAN) 2 % Apply 1 application topically 3 times/day as needed-between meals & bedtime.  (Patient not taking: Reported on 10/07/2020)     No current facility-administered medications for this visit.    Allergies as of 10/07/2020 - Review Complete 10/07/2020  Allergen Reaction Noted  . Penicillin g  07/20/2020  . Penicillins Other (See Comments) 05/11/2010    Vitals: BP (!) 166/85 (BP Location: Right Arm, Patient Position:  Sitting)   Pulse 71   Ht 5\' 11"  (1.803 m)   Wt 260 lb (117.9 kg)   BMI 36.26 kg/m  Last Weight:  Wt Readings from Last 1 Encounters:  10/07/20 260 lb (117.9 kg)   Last Height:   Ht Readings from Last 1 Encounters:  10/07/20  5\' 11"  (1.803 m)     Physical exam: Exam: Gen: NAD, conversant, well nourised, obese, well groomed                     CV: RRR, no MRG. No Carotid Bruits. No peripheral edema, warm, nontender Eyes: Conjunctivae clear without exudates or hemorrhage  Neuro: Detailed Neurologic Exam  Speech:    Speech is normal; fluent and spontaneous with normal comprehension.  Cognition:    MMSE - Mini Mental State Exam 10/07/2020  Orientation to time 5  Orientation to Place 5  Registration 3  Attention/ Calculation 4  Recall 3  Language- name 2 objects 2  Language- repeat 1  Language- follow 3 step command 3  Language- read & follow direction 1  Write a sentence 1  Copy design 1  Total score 29    Cranial Nerves:    The pupils are equal, round, and reactive to light.pupils too small to visualuze fundi.  Visual fields are full to threat . Extraocular movements are intact. Trigeminal sensation is intact and the muscles of mastication are normal. The face is symmetric. The palate elevates in the midline. Hearing intact. Voice is normal. Shoulder shrug is normal. The tongue has normal motion without fasciculations.   Coordination:    No dysmetria or ataxia  Gait:    Intact stride and arm swing  Motor Observation:    No asymmetry, no atrophy, and no involuntary movements noted. Tone:    Normal muscle tone.    Posture:    Posture is normal. normal erect    Strength:    Strength is V/V in the upper and lower limbs. Some mild lower extremity proximal weakness, symmetrical, may be due to large body habitus and large abdominal panus.     Sensation: decreased distally     Reflex Exam:  DTR's: Absent AJ, hypo patellars, 1+ biceps Toes:    The toes are  equiv bilaterally.   Clonus:    Clonus is absent.    Assessment/Plan:  Paient with so many neurologic symptoms it is difficult to narrow the differential: he reports dizziness, memory loss, confusion, ataxia, falls, arm and leg weakness, difficulty with ambulation, vertigo, difficulty getting up off the floor, legs giving out on him, claudication symptoms when walking, numbness and tingling, tremors (tremors not seen on exam today), wife reports shuffling (shuffling not seen on exam today and tone normal no significant parkinsonian symptoms appreciated), vertigo, wife is worried about dementia, odd behaviors, falling backwards/ataxic gait, difficulty getting up from a seated position, even more difficulty getting up off the floor, feeling "wobbly", also starting to fall forward, he feels his entire body is weak at first he denied arm weakness then he stated he does feel his arms are weak, imbalance having to hold onto things to walk, neuropathy.  So, very difficult to localize problems.   -  B12 deficiency: He is getting injections, he is following with heme for this. Explained this can cause dementia and worsen neuropathy - Given his symptoms of leg weakness which what sounds like neurogenic claudication I think we should do an MRI of the lumbar spine to look for spinal stenosis with neurogenic claudication. - Given his ataxia, his falls, tremors, abnormality of gait and other symptoms above we should check an MRI of his cervical spine for cervical stenosis, myelopathy and also subacute combined degeneration given his B12 deficiency which could also cause many of his symptoms. - We should also order an  MRI of the brain to evaluate for reversible causes of dementia, NPH which can cause gait abnormalities and unusual behaviors, look for vascular issues that can cause gait apraxia and others in the brain. - Can follow up in 4-5 months and we can further discuss cognition and possible eval to formal memory  testing but his MMSE today was normal.  - Wife also talks to me in private, says his memory is impaired, ongoing for more than a year, he has left the stove on, the fridge door open, she doesn't let him drive anymore. Will order formal memory testing. - I recommended PT for balance, he declined -MRI of the lumbar spine, cervical spine and brain - Dizziness usually when changing positions, sounds orthostatic. Will check brain.  Orders Placed This Encounter  Procedures  . MR LUMBAR SPINE WO CONTRAST  . MR BRAIN W WO CONTRAST  . MR CERVICAL SPINE WO CONTRAST  . Basic Metabolic Panel  . Ambulatory referral to Neuropsychology   Meds ordered this encounter  Medications  . ALPRAZolam (XANAX) 0.25 MG tablet    Sig: Take 1-2 tabs (0.25mg -0.50mg ) 30-60 minutes before procedure. May repeat if needed.Do not drive.    Dispense:  4 tablet    Refill:  0    Cc: Celene Squibb, MD,  Celene Squibb, MD  Sarina Ill, MD  [facility] [address] [place], [place] [address]  Phone [phone] Fax [phone]  I spent more than 90 minutes of face-to-face and non-face-to-face time with patient on the  1. Ataxia   2. Fall, initial encounter   3. Weakness of both lower extremities   4. Arm weakness   5. Confusion   6. Memory loss   7. Cognitive decline   8. Spinal stenosis of lumbosacral region   9. Disease of spinal cord (HCC)    diagnosis.  This included previsit chart review, lab review, study review, order entry, electronic health record documentation, patient education on the different diagnostic and therapeutic options, counseling and coordination of care, risks and benefits of management, compliance, or risk factor reduction

## 2020-10-07 ENCOUNTER — Encounter: Payer: Self-pay | Admitting: Neurology

## 2020-10-07 ENCOUNTER — Ambulatory Visit (INDEPENDENT_AMBULATORY_CARE_PROVIDER_SITE_OTHER): Payer: Medicare Other | Admitting: Neurology

## 2020-10-07 ENCOUNTER — Telehealth: Payer: Self-pay | Admitting: Neurology

## 2020-10-07 VITALS — BP 166/85 | HR 71 | Ht 71.0 in | Wt 260.0 lb

## 2020-10-07 DIAGNOSIS — G959 Disease of spinal cord, unspecified: Secondary | ICD-10-CM | POA: Diagnosis not present

## 2020-10-07 DIAGNOSIS — R4189 Other symptoms and signs involving cognitive functions and awareness: Secondary | ICD-10-CM | POA: Diagnosis not present

## 2020-10-07 DIAGNOSIS — R413 Other amnesia: Secondary | ICD-10-CM

## 2020-10-07 DIAGNOSIS — R410 Disorientation, unspecified: Secondary | ICD-10-CM

## 2020-10-07 DIAGNOSIS — R270 Ataxia, unspecified: Secondary | ICD-10-CM

## 2020-10-07 DIAGNOSIS — R29898 Other symptoms and signs involving the musculoskeletal system: Secondary | ICD-10-CM | POA: Diagnosis not present

## 2020-10-07 DIAGNOSIS — M4807 Spinal stenosis, lumbosacral region: Secondary | ICD-10-CM | POA: Diagnosis not present

## 2020-10-07 DIAGNOSIS — W19XXXA Unspecified fall, initial encounter: Secondary | ICD-10-CM | POA: Diagnosis not present

## 2020-10-07 MED ORDER — ALPRAZOLAM 0.25 MG PO TABS: ORAL_TABLET | ORAL | 0 refills | Status: DC

## 2020-10-07 NOTE — Patient Instructions (Addendum)
MRI Brain, cervical spine and lumbar spine   Fall Prevention in the Home, Adult Falls can cause injuries. They can happen to people of all ages. There are many things you can do to make your home safe and to help prevent falls. Ask for help when making these changes, if needed. What actions can I take to prevent falls? General Instructions  Use good lighting in all rooms. Replace any light bulbs that burn out.  Turn on the lights when you go into a dark area. Use night-lights.  Keep items that you use often in easy-to-reach places. Lower the shelves around your home if necessary.  Set up your furniture so you have a clear path. Avoid moving your furniture around.  Do not have throw rugs and other things on the floor that can make you trip.  Avoid walking on wet floors.  If any of your floors are uneven, fix them.  Add color or contrast paint or tape to clearly mark and help you see: ? Any grab bars or handrails. ? First and last steps of stairways. ? Where the edge of each step is.  If you use a stepladder: ? Make sure that it is fully opened. Do not climb a closed stepladder. ? Make sure that both sides of the stepladder are locked into place. ? Ask someone to hold the stepladder for you while you use it.  If there are any pets around you, be aware of where they are. What can I do in the bathroom?      Keep the floor dry. Clean up any water that spills onto the floor as soon as it happens.  Remove soap buildup in the tub or shower regularly.  Use non-skid mats or decals on the floor of the tub or shower.  Attach bath mats securely with double-sided, non-slip rug tape.  If you need to sit down in the shower, use a plastic, non-slip stool.  Install grab bars by the toilet and in the tub and shower. Do not use towel bars as grab bars. What can I do in the bedroom?  Make sure that you have a light by your bed that is easy to reach.  Do not use any sheets or blankets  that are too big for your bed. They should not hang down onto the floor.  Have a firm chair that has side arms. You can use this for support while you get dressed. What can I do in the kitchen?  Clean up any spills right away.  If you need to reach something above you, use a strong step stool that has a grab bar.  Keep electrical cords out of the way.  Do not use floor polish or wax that makes floors slippery. If you must use wax, use non-skid floor wax. What can I do with my stairs?  Do not leave any items on the stairs.  Make sure that you have a light switch at the top of the stairs and the bottom of the stairs. If you do not have them, ask someone to add them for you.  Make sure that there are handrails on both sides of the stairs, and use them. Fix handrails that are broken or loose. Make sure that handrails are as long as the stairways.  Install non-slip stair treads on all stairs in your home.  Avoid having throw rugs at the top or bottom of the stairs. If you do have throw rugs, attach them to the floor  with carpet tape.  Choose a carpet that does not hide the edge of the steps on the stairway.  Check any carpeting to make sure that it is firmly attached to the stairs. Fix any carpet that is loose or worn. What can I do on the outside of my home?  Use bright outdoor lighting.  Regularly fix the edges of walkways and driveways and fix any cracks.  Remove anything that might make you trip as you walk through a door, such as a raised step or threshold.  Trim any bushes or trees on the path to your home.  Regularly check to see if handrails are loose or broken. Make sure that both sides of any steps have handrails.  Install guardrails along the edges of any raised decks and porches.  Clear walking paths of anything that might make someone trip, such as tools or rocks.  Have any leaves, snow, or ice cleared regularly.  Use sand or salt on walking paths during  winter.  Clean up any spills in your garage right away. This includes grease or oil spills. What other actions can I take?  Wear shoes that: ? Have a low heel. Do not wear high heels. ? Have rubber bottoms. ? Are comfortable and fit you well. ? Are closed at the toe. Do not wear open-toe sandals.  Use tools that help you move around (mobility aids) if they are needed. These include: ? Canes. ? Walkers. ? Scooters. ? Crutches.  Review your medicines with your doctor. Some medicines can make you feel dizzy. This can increase your chance of falling. Ask your doctor what other things you can do to help prevent falls. Where to find more information  Centers for Disease Control and Prevention, STEADI: https://garcia.biz/  [employer] on Aging: BrainJudge.co.uk Contact a doctor if:  You are afraid of falling at home.  You feel weak, drowsy, or dizzy at home.  You fall at home. Summary  There are many simple things that you can do to make your home safe and to help prevent falls.  Ways to make your home safe include removing tripping hazards and installing grab bars in the bathroom.  Ask for help when making these changes in your home. This information is not intended to replace advice given to you by your health care provider. Make sure you discuss any questions you have with your health care provider. Document Revised: 02/01/2019 Document Reviewed: 05/26/2017 Elsevier Patient Education  2020 [employer].

## 2020-10-07 NOTE — Telephone Encounter (Signed)
Medicare/aarp order sent to [facility]. No auth they will reach out to the patient to schedule.  

## 2020-10-08 ENCOUNTER — Telehealth: Payer: Self-pay | Admitting: *Deleted

## 2020-10-08 LAB — BASIC METABOLIC PANEL
BUN/Creatinine Ratio: 17 (ref 10–24)
BUN: 17 mg/dL (ref 8–27)
CO2: 20 mmol/L (ref 20–29)
Calcium: 9.4 mg/dL (ref 8.6–10.2)
Chloride: 101 mmol/L (ref 96–106)
Creatinine, Ser: 0.99 mg/dL (ref 0.76–1.27)
GFR calc Af Amer: 85 mL/min/{1.73_m2} (ref >59)
GFR calc non Af Amer: 73 mL/min/{1.73_m2} (ref >59)
Glucose: 223 mg/dL — ABNORMAL HIGH (ref 65–99)
Potassium: 3.5 mmol/L (ref 3.5–5.2)
Sodium: 137 mmol/L (ref 134–144)

## 2020-10-08 NOTE — Telephone Encounter (Signed)
-----   Message from Melvenia Beam, MD sent at 10/08/2020  7:29 AM EST ----- Glucose is elevated over 200, please work with primary care to manage your glucose. Otherwise normal. thanks

## 2020-10-08 NOTE — Telephone Encounter (Signed)
Discussed lab results with patient. He is aware to f/u with PCP for management of elevated glucose. His questions were answered and he verbalized appreciation for the call.

## 2020-10-14 ENCOUNTER — Encounter: Payer: Self-pay | Admitting: Psychology

## 2020-10-21 DIAGNOSIS — D519 Vitamin B12 deficiency anemia, unspecified: Secondary | ICD-10-CM | POA: Diagnosis not present

## 2020-10-24 DIAGNOSIS — G629 Polyneuropathy, unspecified: Secondary | ICD-10-CM | POA: Diagnosis not present

## 2020-10-24 DIAGNOSIS — I129 Hypertensive chronic kidney disease with stage 1 through stage 4 chronic kidney disease, or unspecified chronic kidney disease: Secondary | ICD-10-CM | POA: Diagnosis not present

## 2020-10-24 DIAGNOSIS — G4733 Obstructive sleep apnea (adult) (pediatric): Secondary | ICD-10-CM | POA: Diagnosis not present

## 2020-10-24 DIAGNOSIS — D519 Vitamin B12 deficiency anemia, unspecified: Secondary | ICD-10-CM | POA: Diagnosis not present

## 2020-10-24 DIAGNOSIS — R42 Dizziness and giddiness: Secondary | ICD-10-CM | POA: Diagnosis not present

## 2020-10-27 DIAGNOSIS — Z20822 Contact with and (suspected) exposure to covid-19: Secondary | ICD-10-CM | POA: Diagnosis not present

## 2020-11-05 DIAGNOSIS — G629 Polyneuropathy, unspecified: Secondary | ICD-10-CM | POA: Diagnosis not present

## 2020-11-05 DIAGNOSIS — R32 Unspecified urinary incontinence: Secondary | ICD-10-CM | POA: Diagnosis not present

## 2020-11-05 DIAGNOSIS — H6121 Impacted cerumen, right ear: Secondary | ICD-10-CM | POA: Diagnosis not present

## 2020-11-05 DIAGNOSIS — H9201 Otalgia, right ear: Secondary | ICD-10-CM | POA: Diagnosis not present

## 2020-11-05 DIAGNOSIS — L0232 Furuncle of buttock: Secondary | ICD-10-CM | POA: Diagnosis not present

## 2020-11-05 DIAGNOSIS — L723 Sebaceous cyst: Secondary | ICD-10-CM | POA: Diagnosis not present

## 2020-11-05 DIAGNOSIS — R21 Rash and other nonspecific skin eruption: Secondary | ICD-10-CM | POA: Diagnosis not present

## 2020-11-05 DIAGNOSIS — D519 Vitamin B12 deficiency anemia, unspecified: Secondary | ICD-10-CM | POA: Diagnosis not present

## 2020-11-05 DIAGNOSIS — H8110 Benign paroxysmal vertigo, unspecified ear: Secondary | ICD-10-CM | POA: Diagnosis not present

## 2020-11-05 DIAGNOSIS — R2689 Other abnormalities of gait and mobility: Secondary | ICD-10-CM | POA: Diagnosis not present

## 2020-11-05 DIAGNOSIS — Z96651 Presence of right artificial knee joint: Secondary | ICD-10-CM | POA: Diagnosis not present

## 2020-11-05 DIAGNOSIS — Z6836 Body mass index (BMI) 36.0-36.9, adult: Secondary | ICD-10-CM | POA: Diagnosis not present

## 2020-11-06 ENCOUNTER — Other Ambulatory Visit (HOSPITAL_COMMUNITY): Payer: Medicare Other

## 2020-11-07 ENCOUNTER — Other Ambulatory Visit: Payer: Self-pay

## 2020-11-07 ENCOUNTER — Ambulatory Visit
Admission: RE | Admit: 2020-11-07 | Discharge: 2020-11-07 | Disposition: A | Discharge status: Home / Self Care | Payer: Medicare Other | Source: Ambulatory Visit | Attending: Neurology | Admitting: Neurology

## 2020-11-07 ENCOUNTER — Encounter: Payer: Self-pay | Admitting: Radiology

## 2020-11-07 DIAGNOSIS — R413 Other amnesia: Secondary | ICD-10-CM | POA: Diagnosis not present

## 2020-11-07 DIAGNOSIS — R270 Ataxia, unspecified: Secondary | ICD-10-CM

## 2020-11-07 DIAGNOSIS — M48061 Spinal stenosis, lumbar region without neurogenic claudication: Secondary | ICD-10-CM | POA: Diagnosis not present

## 2020-11-07 DIAGNOSIS — R4189 Other symptoms and signs involving cognitive functions and awareness: Secondary | ICD-10-CM

## 2020-11-07 DIAGNOSIS — R29898 Other symptoms and signs involving the musculoskeletal system: Secondary | ICD-10-CM

## 2020-11-07 DIAGNOSIS — R410 Disorientation, unspecified: Secondary | ICD-10-CM

## 2020-11-07 DIAGNOSIS — W19XXXA Unspecified fall, initial encounter: Secondary | ICD-10-CM

## 2020-11-07 DIAGNOSIS — M4807 Spinal stenosis, lumbosacral region: Secondary | ICD-10-CM

## 2020-11-07 DIAGNOSIS — G959 Disease of spinal cord, unspecified: Secondary | ICD-10-CM

## 2020-11-07 MED ORDER — GADOBENATE DIMEGLUMINE 529 MG/ML IV SOLN
20.0000 mL | Freq: Once | INTRAVENOUS | Status: AC | PRN
  Administered 2020-11-07: 20 mL via INTRAVENOUS

## 2020-11-08 ENCOUNTER — Emergency Department (HOSPITAL_COMMUNITY)
Admission: EM | Admit: 2020-11-08 | Discharge: 2020-11-08 | Disposition: A | Discharge status: Home / Self Care | Payer: Medicare Other | Attending: Emergency Medicine | Admitting: Emergency Medicine

## 2020-11-08 ENCOUNTER — Other Ambulatory Visit: Payer: Self-pay

## 2020-11-08 ENCOUNTER — Encounter (HOSPITAL_COMMUNITY): Payer: Self-pay | Admitting: *Deleted

## 2020-11-08 DIAGNOSIS — I10 Essential (primary) hypertension: Secondary | ICD-10-CM | POA: Diagnosis not present

## 2020-11-08 DIAGNOSIS — E1140 Type 2 diabetes mellitus with diabetic neuropathy, unspecified: Secondary | ICD-10-CM | POA: Diagnosis not present

## 2020-11-08 DIAGNOSIS — T783XXA Angioneurotic edema, initial encounter: Secondary | ICD-10-CM | POA: Insufficient documentation

## 2020-11-08 DIAGNOSIS — Z96652 Presence of left artificial knee joint: Secondary | ICD-10-CM | POA: Insufficient documentation

## 2020-11-08 DIAGNOSIS — T508X5A Adverse effect of diagnostic agents, initial encounter: Secondary | ICD-10-CM | POA: Insufficient documentation

## 2020-11-08 DIAGNOSIS — Z79899 Other long term (current) drug therapy: Secondary | ICD-10-CM | POA: Insufficient documentation

## 2020-11-08 DIAGNOSIS — Z91041 Radiographic dye allergy status: Secondary | ICD-10-CM | POA: Insufficient documentation

## 2020-11-08 DIAGNOSIS — H5789 Other specified disorders of eye and adnexa: Secondary | ICD-10-CM | POA: Diagnosis present

## 2020-11-08 DIAGNOSIS — T7840XA Allergy, unspecified, initial encounter: Secondary | ICD-10-CM

## 2020-11-08 DIAGNOSIS — Z7984 Long term (current) use of oral hypoglycemic drugs: Secondary | ICD-10-CM | POA: Diagnosis not present

## 2020-11-08 NOTE — ED Provider Notes (Signed)
[facility] EMERGENCY DEPARTMENT Provider Note   CSN: [id number] Arrival date & time: 11/08/20  0818     History Chief Complaint  Patient presents with  . Facial Swelling    Brian [name] is a 80 y.o. male.  HPI He presents for evaluation of swelling around his eyes.  He states that yesterday his eyes were nearly completely swollen shut, but that has improved after taking 2 doses of Benadryl.  Last dose of Benadryl was at 3 AM this morning.  The swelling started immediately after receiving IV contrast dye during an MRI procedure yesterday.  He denies shortness of breath, cough, chest pain, weakness or dizziness.  He uses CPAP for sleep apnea and has a chronically low oxygen in the 93 to 95% range.  He denies other pulmonary disorders.  There are no other known modifying factors.    Past Medical History:  Diagnosis Date  . Arthritis   . Atypical chest pain    Low-risk exercise stress Cardiolite EF 60% April 2008. Reportedly normal remote cardiac catheterization @ [facility] EF 60%...nuclear....2008  . Bradycardia    hospital 03/2010 stopped beta blocker  . Complication of anesthesia   . Diabetes mellitus without complication (HCC)   . Dyslipidemia   . Hypertension   . Mild aortic regurgitation with LV dilation by prior echocardiogram   . Neuropathy    in feet  . Obesity   . Obstructive sleep apnea    C-Pap compliant  . PONV (postoperative nausea and vomiting)   . Thrombocytopenia (HCC)    mild follow Dr. Barnie Mort  . Vertigo    Hospital 03/2010    Patient Active Problem List   Diagnosis Date Noted  . ETD (Eustachian tube dysfunction), bilateral 01/10/2018  . Bilateral impacted cerumen 07/19/2017  . Neck pain on right side 07/19/2017  . Otalgia of right ear 07/19/2017  . Tendonitis, Achilles, right 08/27/2014  . Stiffness of joint, not elsewhere classified, lower leg 06/11/2014  . Pain in joint, lower leg 06/11/2014  . Abnormality of gait 06/11/2014  . 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 Derangement of posterior horn of medial meniscus 04/09/2014  . OA (osteoarthritis) of knee 04/09/2014  . Knee pain 04/09/2014  . THROMBOCYTOPENIA 05/12/2010  . AORTIC INSUFFICIENCY 05/12/2010  . OBESITY, UNSPECIFIED 05/11/2010  . HYPERTENSION 05/11/2010  . BRADYCARDIA 05/11/2010  . VERTIGO 05/11/2010  . UNSPECIFIED SLEEP APNEA 05/11/2010  . CHEST PAIN UNSPECIFIED 05/11/2010    Past Surgical History:  Procedure Laterality Date  . CARDIAC CATHETERIZATION    . KNEE ARTHROSCOPY WITH MEDIAL MENISECTOMY Left 05/31/2014   Procedure: LEFT KNEE ARTHROSCOPY WITH MEDIAL MENISECTOMY;  Surgeon: Vickki Hearing, MD;  Location: [facility];  Service: Orthopedics;  Laterality: Left;  . Status post bilateral arthroscopic knee surgery    . TOTAL KNEE ARTHROPLASTY Left 04/14/2016   Procedure: LEFT TOTAL KNEE ARTHROPLASTY;  Surgeon: Vickki Hearing, MD;  Location: [facility];  Service: Orthopedics;  Laterality: Left;       Family History  Problem Relation Age of Onset  . Heart attack Mother        fatal, cause of death  . Heart attack Maternal Grandmother   . Memory loss Neg Hx     Social History   Tobacco Use  . Smoking status: Never Smoker  . Smokeless tobacco: Never Used  Vaping Use  . Vaping Use: Never used  Substance Use Topics  . Alcohol use: Not Currently    Comment: wine  on occasion  . Drug use: No    Home Medications Prior to Admission medications   Medication Sig Start Date End Date Taking? Authorizing Provider  acetaminophen (TYLENOL) 500 MG tablet Take 1,000 mg by mouth every 6 (six) hours as needed for mild pain or moderate pain.     [provider]  ALPRAZolam (XANAX) 0.25 MG tablet Take 1-2 tabs (0.25mg -0.50mg ) 30-60 minutes before procedure. May repeat if needed.Do not drive. 10/07/20   Melvenia Beam, MD  amLODipine (NORVASC) 10 MG tablet Take 10 mg by mouth daily.  05/24/17   [provider]  B Complex Vitamins  (VITAMIN B-COMPLEX) TABS Take 1 tablet by mouth daily.     [provider]  cyanocobalamin (,VITAMIN B-12,) 1000 MCG/ML injection every 30 (thirty) days. 09/11/20   [provider]  fluticasone (FLONASE) 50 MCG/ACT nasal spray Place 2 sprays into both nostrils at bedtime.  11/09/18   [provider]  gabapentin (NEURONTIN) 100 MG capsule Take 100-300 mg by mouth at bedtime as needed (for pain).  01/01/20   [provider]  glipiZIDE (GLUCOTROL XL) 5 MG 24 hr tablet Take 5 mg by mouth 2 (two) times daily.    [provider]  Glucosamine-Chondroit-Vit C-Mn (GLUCOSAMINE 1500 COMPLEX PO) Take 1 tablet by mouth daily.     [provider]  meclizine (ANTIVERT) 25 MG tablet Take 1 tablet (25 mg total) by mouth 3 (three) times daily as needed for dizziness. may cause drowsiness 01/26/20   Triplett, Tammy, PA-C  Melatonin 10 MG CAPS Take 10 mg by mouth at bedtime.     [provider]  metFORMIN (GLUCOPHAGE) 500 MG tablet Take 500 mg by mouth 4 (four) times daily.     [provider]  mupirocin ointment (BACTROBAN) 2 % Apply 1 application topically 3 times/day as needed-between meals & bedtime.  Patient not taking: Reported on 10/07/2020 10/22/19   [provider]  olmesartan (BENICAR) 40 MG tablet Take 40 mg by mouth daily.  01/28/18   [provider]    Allergies    Contrast media [iodinated diagnostic agents], Penicillin g, and Penicillins  Review of Systems   Review of Systems  All other systems reviewed and are negative.   Physical Exam Updated Vital Signs BP (!) 156/91 (BP Location: Left Arm)   Pulse 70   Temp 97.7 F (36.5 C) (Oral)   Resp 16   Ht 5\' 11"  (1.803 m)   Wt 121.6 kg   SpO2 94%   BMI 37.38 kg/m   Physical Exam Vitals and nursing note reviewed.  Constitutional:      General: He is not in acute distress.    Appearance: He is well-developed and well-nourished. He is not ill-appearing,  toxic-appearing or diaphoretic.  HENT:     Head: Normocephalic and atraumatic.     Right Ear: External ear normal.     Left Ear: External ear normal.     Nose: No congestion or rhinorrhea.     Mouth/Throat:     Mouth: Mucous membranes are moist.     Pharynx: No oropharyngeal exudate or posterior oropharyngeal erythema.  Eyes:     Extraocular Movements: EOM normal.     Conjunctiva/sclera: Conjunctivae normal.     Pupils: Pupils are equal, round, and reactive to light.  Neck:     Trachea: Phonation normal.  Cardiovascular:     Rate and Rhythm: Normal rate and regular rhythm.     Heart sounds: Normal heart  sounds.  Pulmonary:     Effort: Pulmonary effort is normal. No respiratory distress.     Breath sounds: Normal breath sounds. No stridor.  Chest:     Chest wall: No bony tenderness.  Abdominal:     General: There is no distension.  Musculoskeletal:        General: Normal range of motion.     Cervical back: Normal range of motion and neck supple.  Skin:    General: Skin is warm, dry and intact.     Comments: Mild periorbital angioedema, right greater than left.  He can open eyes nearly fully.  No other angioedema of the face, lips or oropharynx.  Neurological:     Mental Status: He is alert and oriented to person, place, and time.     Cranial Nerves: No cranial nerve deficit.     Sensory: No sensory deficit.     Motor: No abnormal muscle tone.     Coordination: Coordination normal.  Psychiatric:        Mood and Affect: Mood and affect and mood normal.        Behavior: Behavior normal.        Thought Content: Thought content normal.        Judgment: Judgment normal.     ED Results / Procedures / Treatments   Labs (all labs ordered are listed, but only abnormal results are displayed) Labs Reviewed - No data to display  EKG None  Radiology No results found.  Procedures Procedures (including critical care time)  Medications Ordered in ED Medications - No data to  display  ED Course  I have reviewed the triage vital signs and the nursing notes.  Pertinent labs & imaging results that were available during my care of the patient were reviewed by me and considered in my medical decision making (see chart for details).    MDM Rules/Calculators/A&P                           Patient Vitals for the past 24 hrs:  BP Temp Temp src Pulse Resp SpO2 Height Weight  11/08/20 0835 (!) 156/91 97.7 F (36.5 C) Oral 70 16 94 % - -  11/08/20 0830 - - - - - - 5\' 11"  (1.803 m) 121.6 kg    8:50 AM Reevaluation with update and discussion. After initial assessment and treatment, an updated evaluation reveals no change in clinical status, findings discussed with the patient and all questions were answered. Daleen Bo   Medical Decision Making:  This patient is presenting for evaluation of allergic reaction to MRI contrast dye, which does require a range of treatment options, and is a complaint that involves a moderate risk of morbidity and mortality. The differential diagnoses include allergic reaction, anaphylaxis, coincidental illness. I decided to review old records, and in summary elderly male, presenting for swelling after being exposed to IV dye.  Symptoms improving after 2 doses of Benadryl.  I did not require additional historical information from anyone.    Critical Interventions-clinical evaluation, discussion with patient  After These Interventions, the Patient was reevaluated and was found stable for discharge.  He is improving with Benadryl, which she is taking without complication.  I have advised him on use of Pepcid as additional control for symptoms if needed.  I do not believe he will require a steroid at this time.  CRITICAL CARE-no Performed by: Daleen Bo  Nursing Notes Reviewed/ Care Coordinated  Applicable Imaging Reviewed Interpretation of Laboratory Data incorporated into ED treatment  The patient appears reasonably screened and/or  stabilized for discharge and I doubt any other medical condition or other [facility] requiring further screening, evaluation, or treatment in the ED at this time prior to discharge.  Plan: Home Medications-continue usual, use Benadryl and/or Pepcid, for another for 5 days as needed for swelling of the periorbital region; Home Treatments-rest, usual diet; return here if the recommended treatment, does not improve the symptoms; Recommended follow up-PCP or return here if needed.     Final Clinical Impression(s) / ED Diagnoses Final diagnoses:  Allergic reaction, initial encounter    Rx / DC Orders ED Discharge Orders    None       Daleen Bo, MD 11/08/20 [phone]

## 2020-11-08 NOTE — ED Triage Notes (Signed)
Pt reports he had a MRI done yesterday at [facility] and while receiving the IV contrast pt's eyes started burning and his eyes started swelling immediately. Pt was given Benadryl 50mg  while at the facility which helped the swelling to decrease. This morning at 0230 pt woke up with his eyes still swollen so he took 82m of Benadryl. Pt was told to come to the ED this morning if swelling was still present. Pt denies difficulty breathing, rash.

## 2020-11-08 NOTE — Discharge Instructions (Addendum)
It appears that the Benadryl is helping your swelling however you need to take some more.  You can use Benadryl 25 mg 4 times a day, as needed for swelling.  You may need to do this for another 4 or 5 days.  You can also consider adding Pepcid (famotidine), 20 mg twice a day for additional control of the allergic reaction.  Follow-up with your doctor or return here if needed for problems.

## 2020-11-12 ENCOUNTER — Telehealth: Payer: Self-pay | Admitting: *Deleted

## 2020-11-12 NOTE — Telephone Encounter (Signed)
Dr Jaynee Eagles aware pt cannot take Aspirin. These changes in the brain are irreversible. Patient has pending appt with Dr Sima Matas in April 2022. He can follow up with Dr Jaynee Eagles after he sees Dr Sima Matas (per Dr Jaynee Eagles).

## 2020-11-12 NOTE — Telephone Encounter (Addendum)
I called the patient and discussed all of the MRI results as noted below (cervical spine, lumbar spine, and brain. He and his wife listened to the results and they verbalized understanding. Their questions were answered. The patient stated he cannot take Aspirin because of low WBC. They will await a call back regarding when to follow up with Dr Jaynee Eagles.   ---------------------------------------------------------------- ----- Message from Melvenia Beam, MD sent at 11/11/2020 10:05 AM EST ----- MRI of the lumbar  spine shows arthritic changes but the spinal cord does not appear to be affected so I do not think this shows anything to explain his walking problems. We can discuss at follow up.  Melvenia Beam, MD  11/11/2020 10:02 AM EST      MRI of the cervical spine shows arthritic changes but the spinal cord does not appear to be affected so I do not think this shows anything to explain his walking problems.  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 If he is not taking aspirin, unless some contraindication, he should start 81mg . We should also have a follow up to discuss and review the images of the brain. The white matter changes can cause memory problems and walking problems. Schedule them for a sooner follow up please thanks

## 2020-11-13 ENCOUNTER — Ambulatory Visit (HOSPITAL_COMMUNITY): Payer: Medicare Other | Admitting: Hematology

## 2020-11-17 ENCOUNTER — Other Ambulatory Visit (HOSPITAL_COMMUNITY): Payer: Medicare Other

## 2020-11-18 ENCOUNTER — Other Ambulatory Visit (HOSPITAL_COMMUNITY): Payer: Self-pay | Admitting: *Deleted

## 2020-11-18 DIAGNOSIS — D519 Vitamin B12 deficiency anemia, unspecified: Secondary | ICD-10-CM | POA: Diagnosis not present

## 2020-11-19 NOTE — Telephone Encounter (Signed)
I need to call Dr Ferne Coe office to see roughly when testing will be completed and results received so we can schedule the patient for a follow-up with Dr Jaynee Eagles afterward. Their office is closed. Will call tomorrow morning.

## 2020-11-20 ENCOUNTER — Other Ambulatory Visit: Payer: Self-pay

## 2020-11-20 ENCOUNTER — Inpatient Hospital Stay (HOSPITAL_COMMUNITY): Payer: Medicare Other | Attending: Hematology

## 2020-11-20 DIAGNOSIS — E785 Hyperlipidemia, unspecified: Secondary | ICD-10-CM | POA: Insufficient documentation

## 2020-11-20 DIAGNOSIS — M1990 Unspecified osteoarthritis, unspecified site: Secondary | ICD-10-CM | POA: Insufficient documentation

## 2020-11-20 DIAGNOSIS — E669 Obesity, unspecified: Secondary | ICD-10-CM | POA: Insufficient documentation

## 2020-11-20 DIAGNOSIS — Z79899 Other long term (current) drug therapy: Secondary | ICD-10-CM | POA: Diagnosis not present

## 2020-11-20 DIAGNOSIS — I10 Essential (primary) hypertension: Secondary | ICD-10-CM | POA: Diagnosis not present

## 2020-11-20 DIAGNOSIS — D696 Thrombocytopenia, unspecified: Secondary | ICD-10-CM | POA: Insufficient documentation

## 2020-11-20 DIAGNOSIS — G4733 Obstructive sleep apnea (adult) (pediatric): Secondary | ICD-10-CM | POA: Diagnosis not present

## 2020-11-20 DIAGNOSIS — Z7984 Long term (current) use of oral hypoglycemic drugs: Secondary | ICD-10-CM | POA: Insufficient documentation

## 2020-11-20 DIAGNOSIS — E119 Type 2 diabetes mellitus without complications: Secondary | ICD-10-CM | POA: Diagnosis not present

## 2020-11-20 LAB — CBC WITH DIFFERENTIAL/PLATELET
Abs Immature Granulocytes: 0.05 10*3/uL (ref 0.00–0.07)
Basophils Absolute: 0 10*3/uL (ref 0.0–0.1)
Basophils Relative: 0 %
Eosinophils Absolute: 0.3 10*3/uL (ref 0.0–0.5)
Eosinophils Relative: 4 %
HCT: 42.9 % (ref 39.0–52.0)
Hemoglobin: 14.6 g/dL (ref 13.0–17.0)
Immature Granulocytes: 1 %
Lymphocytes Relative: 26 %
Lymphs Abs: 2 10*3/uL (ref 0.7–4.0)
MCH: 31.1 pg (ref 26.0–34.0)
MCHC: 34 g/dL (ref 30.0–36.0)
MCV: 91.3 fL (ref 80.0–100.0)
Monocytes Absolute: 0.5 10*3/uL (ref 0.1–1.0)
Monocytes Relative: 6 %
Neutro Abs: 4.9 10*3/uL (ref 1.7–7.7)
Neutrophils Relative %: 63 %
Platelets: 46 10*3/uL — ABNORMAL LOW (ref 150–400)
RBC: 4.7 MIL/uL (ref 4.22–5.81)
RDW: 12.5 % (ref 11.5–15.5)
WBC: 7.8 10*3/uL (ref 4.0–10.5)
nRBC: 0 % (ref 0.0–0.2)

## 2020-11-20 NOTE — Telephone Encounter (Signed)
Pt now has an appt with Dr Darol Destine on 12/23/20. Testing and follow up will be scheduled that same day. I will call the pt and ask him to call us when we has the appts scheduled so we can schedule a follow-up with Dr Jaynee Eagles timed for after his follow-up with Dr Darol Destine.

## 2020-11-20 NOTE — Telephone Encounter (Signed)
Spoke with [place]. The appt on 02/02/21 is only the initial visit. Testing, interpretation, and follow-up will be scheduled at the time of the appt. If patient needs to be seen sooner, Dr Darol Destine has openings next month.

## 2020-11-20 NOTE — Telephone Encounter (Signed)
I spoke with the patient. He will call our office to schedule a follow-up with Dr Jaynee Eagles as soon as he gets the testing and follow-up appointments scheduled with Dr Darol Destine. Also let him know Dr Jaynee Eagles is aware he cannot take the aspirin, advised the changes in the brain are irreversible and we will discuss further at follow-up, encouraged him to watch BP, cholesterol, etc.

## 2020-11-24 ENCOUNTER — Other Ambulatory Visit: Payer: Self-pay

## 2020-11-24 ENCOUNTER — Inpatient Hospital Stay (HOSPITAL_BASED_OUTPATIENT_CLINIC_OR_DEPARTMENT_OTHER): Payer: Medicare Other | Admitting: Hematology

## 2020-11-24 VITALS — BP 148/81 | HR 70 | Temp 97.1°F | Resp 18 | Wt 266.0 lb

## 2020-11-24 DIAGNOSIS — I10 Essential (primary) hypertension: Secondary | ICD-10-CM | POA: Diagnosis not present

## 2020-11-24 DIAGNOSIS — D693 Immune thrombocytopenic purpura: Secondary | ICD-10-CM

## 2020-11-24 DIAGNOSIS — D696 Thrombocytopenia, unspecified: Secondary | ICD-10-CM | POA: Diagnosis not present

## 2020-11-24 DIAGNOSIS — E669 Obesity, unspecified: Secondary | ICD-10-CM | POA: Diagnosis not present

## 2020-11-24 DIAGNOSIS — G4733 Obstructive sleep apnea (adult) (pediatric): Secondary | ICD-10-CM | POA: Diagnosis not present

## 2020-11-24 DIAGNOSIS — E785 Hyperlipidemia, unspecified: Secondary | ICD-10-CM | POA: Diagnosis not present

## 2020-11-24 DIAGNOSIS — E119 Type 2 diabetes mellitus without complications: Secondary | ICD-10-CM | POA: Diagnosis not present

## 2020-11-24 NOTE — Patient Instructions (Addendum)
[facility] at [facility] Discharge Instructions  You were seen today by Dr. Katragadda. He went over your recent results. Dr. Katragadda will see you back in 3 months for labs and follow up.   Thank you for choosing [facility] at [facility] to provide your oncology and hematology care.  To afford each patient quality time with our provider, please arrive at least 15 minutes before your scheduled appointment time.   If you have a lab appointment with the Cancer Center please come in thru the Main Entrance and check in at the main information desk  You need to re-schedule your appointment should you arrive 10 or more minutes late.  We strive to give you quality time with our providers, and arriving late affects you and other patients whose appointments are after yours.  Also, if you no show three or more times for appointments you may be dismissed from the clinic at the providers discretion.     Again, thank you for choosing [facility].  Our hope is that these requests will decrease the amount of time that you wait before being seen by our physicians.       _____________________________________________________________  Should you have questions after your visit to [facility], please contact our office at [phone] between the hours of 8:00 a.m. and 4:30 p.m.  Voicemails left after 4:00 p.m. will not be returned until the following business day.  For prescription refill requests, have your pharmacy contact our office and allow 72 hours.    Cancer Center Support Programs:   > Cancer Support Group  2nd Tuesday of the month 1pm-2pm, Journey Room   

## 2020-11-24 NOTE — Progress Notes (Signed)
[place],  [address]   CLINIC:  Medical Oncology/Hematology  PCP:  Celene Squibb, MD [address] [name] [address]  [phone]  REASON FOR VISIT:  Follow-up for immune-mediated thrombocytopenia  PRIOR THERAPY: None  CURRENT THERAPY: Observation  INTERVAL HISTORY:  Brian [name], a 80 y.o. 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 Brian [name] was last seen by Dr. Benay Pike on 09/29/2020.  Today he reports feeling well. He denies having nosebleeds, hematuria or hematochezia. He denies having recent infections, F/C or night sweats. He is getting his B12 injections monthly and taking B12 vitamins daily. He took steroids before and notes that he could not fall asleep while on it.   REVIEW OF SYSTEMS:  Review of Systems  Constitutional: Positive for fatigue (50%). Negative for appetite change, chills, diaphoresis and fever.  HENT:   Negative for nosebleeds.   Respiratory: Positive for shortness of breath (w/ exertion).   Gastrointestinal: Negative for blood in stool.  Genitourinary: Positive for difficulty urinating. Negative for hematuria.   Musculoskeletal: Positive for arthralgias (2/10 foot pain).  Neurological: Positive for dizziness and numbness (feet).  All other systems reviewed and are negative.   PAST MEDICAL/SURGICAL HISTORY:  Past Medical History:  Diagnosis Date  . Arthritis   . Atypical chest pain    Low-risk exercise stress Cardiolite EF 60% April 2008. Reportedly normal remote cardiac catheterization @ [place] EF 60%...nuclear....2008  . Bradycardia    hospital 03/2010 stopped beta blocker  . Complication of anesthesia   . Diabetes mellitus without complication ([facility])   . Dyslipidemia   . Hypertension   . Mild aortic regurgitation with LV dilation by prior echocardiogram   . Neuropathy    in feet  . Obesity   . Obstructive sleep apnea    C-Pap compliant  .  PONV (postoperative nausea and vomiting)   . Thrombocytopenia (HCC)    mild follow Dr. Alonna Minium  . Vertigo    Hospital 03/2010   Past Surgical History:  Procedure Laterality Date  . CARDIAC CATHETERIZATION    . KNEE ARTHROSCOPY WITH MEDIAL MENISECTOMY Left 05/31/2014   Procedure: LEFT KNEE ARTHROSCOPY WITH MEDIAL MENISECTOMY;  Surgeon: Carole Civil, MD;  Location: [facility];  Service: Orthopedics;  Laterality: Left;  . Status post bilateral arthroscopic knee surgery    . 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 Marital status: Married    Spouse name: Not on file  . Number of children: 3  . Years of education: Not on file  . Highest education level: Some college, no degree  Occupational History  . Not on file  Tobacco Use  . Smoking status: Never Smoker  . Smokeless tobacco: Never Used  Vaping Use  . Vaping Use: Never used  Substance and Sexual Activity  . Alcohol use: Not Currently    Comment: wine on occasion  . Drug use: No  . Sexual activity: Not Currently  Other Topics Concern  . Not on file  Social History Narrative   Lives at home with wife   Right handed   Caffeine: Diet coke roughly 12 oz in a day    Social Determinants of Health   Financial Resource Strain: Low Risk   .  Difficulty of Paying Living Expenses: Not hard at all  Food Insecurity: No Food Insecurity  . Worried About Charity fundraiser in the Last Year: Never true  . Ran Out of Food in the Last Year: Never true  Transportation Needs: No Transportation Needs  . Lack of Transportation (Medical): No  . Lack of Transportation (Non-Medical): No  Physical Activity: Inactive  . Days of Exercise per Week: 0 days  . Minutes of Exercise per Session: 0 min  Stress: No Stress Concern Present  . Feeling of Stress  : Not at all  Social Connections: Moderately Integrated  . Frequency of Communication with Friends and Family: More than three times a week  . Frequency of Social Gatherings with Friends and Family: Three times a week  . Attends Religious Services: 1 to 4 times per year  . Active Member of Clubs or Organizations: No  . Attends Archivist Meetings: Never  . Marital Status: Married  Human resources officer Violence: Not At Risk  . Fear of Current or Ex-Partner: No  . Emotionally Abused: No  . Physically Abused: No  . Sexually Abused: No    FAMILY HISTORY:  Family History  Problem Relation Age of Onset  . Heart attack Mother        fatal, cause of death  . Heart attack Maternal Grandmother   . Memory loss Neg Hx     CURRENT MEDICATIONS:  Current Outpatient Medications  Medication Sig Dispense Refill  . acetaminophen (TYLENOL) 500 MG tablet Take 1,000 mg by mouth every 6 (six) hours as needed for mild pain or moderate pain.     Marland Kitchen amLODipine (NORVASC) 10 MG tablet Take 10 mg by mouth daily.     . B Complex Vitamins (VITAMIN B-COMPLEX) TABS Take 1 tablet by mouth daily.     . cyanocobalamin (,VITAMIN B-12,) 1000 MCG/ML injection every 30 (thirty) days.    . fluticasone (FLONASE) 50 MCG/ACT nasal spray Place 2 sprays into both nostrils at bedtime.     . gabapentin (NEURONTIN) 100 MG capsule Take 100-300 mg by mouth at bedtime as needed (for pain).     Marland Kitchen glipiZIDE (GLUCOTROL XL) 5 MG 24 hr tablet Take 5 mg by mouth 2 (two) times daily.    . Glucosamine-Chondroit-Vit C-Mn (GLUCOSAMINE 1500 COMPLEX PO) Take 1 tablet by mouth daily.     . Glucose Blood (GNP TRUE METRIX GLUCOSE STRIPS VI) USE THREE TIMES DAILY    . meclizine (ANTIVERT) 25 MG tablet Take 1 tablet (25 mg total) by mouth 3 (three) times daily as needed for dizziness. may cause drowsiness 21 tablet 0  . Melatonin 10 MG CAPS Take 10 mg by mouth at bedtime.     . metFORMIN (GLUCOPHAGE) 500 MG tablet Take 500 mg by mouth 4  (four) times daily.     Marland Kitchen olmesartan (BENICAR) 40 MG tablet Take 40 mg by mouth daily.     . vitamin B-12 (CYANOCOBALAMIN) 1000 MCG tablet Take 1,000 mcg by mouth daily.    Marland Kitchen ALPRAZolam (XANAX) 0.25 MG tablet Take 1-2 tabs (0.76m-0.50mg) 30-60 minutes before procedure. May repeat if needed.Do not drive. (Patient not taking: Reported on 11/24/2020) 4 tablet 0  . mupirocin ointment (BACTROBAN) 2 % Apply 1 application topically 3 times/day as needed-between meals & bedtime.  (Patient not taking: Reported on 11/24/2020)     No current facility-administered medications for this visit.    ALLERGIES:  Allergies  Allergen Reactions  . Contrast Media [Iodinated Diagnostic  Agents] Other (See Comments)    Allergic to MRI contrast. Eyes swelling very badly   . Penicillin G   . Penicillins Other (See Comments)    dizziness    PHYSICAL EXAM:  Performance status (ECOG): 1 - Symptomatic but completely ambulatory  Vitals:   11/24/20 1414  BP: (!) 148/81  Pulse: 70  Resp: 18  Temp: (!) 97.1 F (36.2 C)  SpO2: 95%   Wt Readings from Last 3 Encounters:  11/24/20 266 lb (120.7 kg)  11/08/20 268 lb (121.6 kg)  10/07/20 260 lb (117.9 kg)   Physical Exam Vitals reviewed.  Constitutional:      Appearance: Normal appearance. He is obese.  HENT:     Mouth/Throat:     Lips: No lesions.     Mouth: No oral lesions.     Dentition: No gum lesions.     Tongue: No lesions.     Pharynx: No posterior oropharyngeal erythema.  Cardiovascular:     Rate and Rhythm: Normal rate and regular rhythm.     Pulses: Normal pulses.     Heart sounds: Normal heart sounds.  Pulmonary:     Effort: Pulmonary effort is normal.     Breath sounds: Normal breath sounds.  Neurological:     General: No focal deficit present.     Mental Status: He is alert and oriented to person, place, and time.  Psychiatric:        Mood and Affect: Mood normal.        Behavior: Behavior normal.     LABORATORY DATA:  I have  reviewed the labs as listed.  CBC Latest Ref Rng & Units 11/20/2020 09/26/2020 05/08/2020  WBC 4.0 - 10.5 K/uL 7.8 7.5 6.8  Hemoglobin 13.0 - 17.0 g/dL 14.6 14.0 14.8  Hematocrit 39.0 - 52.0 % 42.9 40.5 43.0  Platelets 150 - 400 K/uL 46(L) 30(L) 62(L)   CMP Latest Ref Rng & Units 10/07/2020 05/08/2020 02/22/2020  Glucose 65 - 99 mg/dL 223(H) 155(H) 170(H)  BUN 8 - 27 mg/dL 17 19 30(H)  Creatinine 0.76 - 1.27 mg/dL 0.99 0.90 1.14  Sodium 134 - 144 mmol/L 137 137 134(L)  Potassium 3.5 - 5.2 mmol/L 3.5 3.8 3.8  Chloride 96 - 106 mmol/L 101 106 104  CO2 20 - 29 mmol/L 20 22 21(L)  Calcium 8.6 - 10.2 mg/dL 9.4 9.1 9.3  Total Protein 6.5 - 8.1 g/dL - 7.1 6.6  Total Bilirubin 0.3 - 1.2 mg/dL - 0.6 0.7  Alkaline Phos 38 - 126 U/L - 53 50  AST 15 - 41 U/L - 17 19  ALT 0 - 44 U/L - 18 18      Component Value Date/Time   RBC 4.70 11/20/2020 1324   MCV 91.3 11/20/2020 1324   MCH 31.1 11/20/2020 1324   MCHC 34.0 11/20/2020 1324   RDW 12.5 11/20/2020 1324   LYMPHSABS 2.0 11/20/2020 1324   MONOABS 0.5 11/20/2020 1324   EOSABS 0.3 11/20/2020 1324   BASOSABS 0.0 11/20/2020 1324   Lab Results  Component Value Date   LDH 140 09/26/2020   LDH 122 05/08/2020   LDH 147 02/22/2020    DIAGNOSTIC IMAGING:  I have independently reviewed the scans and discussed with the patient. MR BRAIN W WO CONTRAST  Result Date: 11/08/2020  [facility] [address] [place], [place] [address] [phone] NEUROIMAGING REPORT STUDY DATE: 11/07/2020 PATIENT NAME: Brian [name] DOB: May 16, 1941 MRN: [id number] ORDERING CLINICIAN: Dr. Jaynee Eagles CLINICAL  HISTORY: 80 year old patient been evaluated for memory loss COMPARISON FILMS: CT head 11/10/2019 EXAM: MRI brain with and without contrast TECHNIQUE: MRI of the brain with and without contrast was obtained utilizing 5 mm axial slices with T1, T2, T2 flair, T2 star gradient echo and diffusion weighted views.  T1 sagittal, T2 coronal and postcontrast  views in the axial and coronal plane were obtained. 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  No acute abnormalities noted.No abnormal lesions are seen on diffusion-weighted views to suggest acute ischemia. The cortical sulci, fissures and cisterns are normal in size and appearance. Lateral, third and fourth ventricle are normal in size and appearance. No extra-axial fluid collections are seen. No evidence of mass effect or midline shift.  No abnormal lesions are seen on post contrast views.  On sagittal views the posterior fossa, pituitary gland and corpus callosum are unremarkable.  There are mild changes of chronic paranasal sinus inflammation.  No evidence of intracranial hemorrhage on gradient-echo views. The orbits and their contents, paranasal sinuses and calvarium are unremarkable.  Intracranial flow voids are present.   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  No acute abnormalities noted. INTERPRETING PHYSICIAN: Antony Contras, MD Certified in  Neuroimaging by [place] [facility] and [place] for Neurological Subspecialities  MR CERVICAL SPINE WO CONTRAST  Result Date: 11/08/2020  [facility] [address] [place], [place] [address] [phone] NEUROIMAGING REPORT STUDY DATE: 11/07/2020 PATIENT NAME: Brian [name] DOB: [birth year] MRN: [id number] ORDERING CLINICIAN: Dr. Jaynee Eagles CLINICAL HISTORY: 80 year old patient being evaluated for ataxia COMPARISON FILMS: None  available EXAM: MRI cervical spine without contrast TECHNIQUE: MRI of the cervical spine was obtained utilizing 3 mm sagittal slices from the posterior fossa down to the T3-4 level with T1, T2 and inversion recovery views. In addition 4 mm axial slices from O7-1 down to T1-2 level were included with T2 and gradient echo views. CONTRAST: None IMAGING SITE: [facility] FINDINGS: Cervical vertebrae demonstrate slight loss of followed lordotic curvature but normal body height and marrow signal characteristics.  Intervertebral disc shows mild disc signal abnormalities throughout.  C2-3 shows no significant abnormalities.  C3-4 shows mild disc signal abnormality and hypertrophy of ligamentum flavum   but no significant compression.  C4-5 shows slight broad-based disc protrusion centrally resulting in effacement of thecal sac but no definite compression.  C5-6 shows loss of disc height as well as broad-based disc osteophyte protrusion along with ligamentum flavum hypertrophy resulting in mild canal and moderate left greater than right foraminal narrowing with no definite encroachment on nerve roots.  C6-7 also shows broad-based resolved osteophyte protrusion centrally resulting in displacement of the cord but no significant compression.  Spinal cord parenchyma shows no abnormal signal intensities.  Visualized portion of the lower brainstem shows a lacunar infarct in the pons.  Paraspinal soft tissues show no abnormalities   Abnormal MRI scan cervical spine without contrast showing prominent disc degenerative change at C5-6 resulting in mild canal and moderate left greater than right foraminal narrowing. INTERPRETING PHYSICIAN: Antony Contras, MD Certified in  Neuroimaging  by [place] [employer] [facility] and [employer] for Neurological Subspecialities  MR LUMBAR SPINE WO CONTRAST  Result Date: 11/08/2020  [facility] [address], [place],  [address] [phone]  NEUROIMAGING REPORT STUDY DATE: 11/07/2020 PATIENT NAME: Brian [name] DOB: 11/27/1940 MRN: [id number] ORDERING CLINICIAN: Dr. Jaynee Eagles CLINICAL HISTORY: 80 year old patient being evaluated for ataxia COMPARISON FILMS: None EXAM: MRI lumbar spine without contrast TECHNIQUE: MRI of the lumbar spine was obtained utilizing 4 mm sagittal slices from A76-81 down to the lower sacrum with T1, T2 and inversion recovery views. In addition 4 mm axial slices from L5-7 down to L5-S1 level were included with T1 and T2 weighted views. CONTRAST: None IMAGING SITE:  [facility] FINDINGS: The lumbar vertebrae demonstrate normal alignment, body height and bone marrow signal characteristics. L1-L2 and L2-L3 shows only minor disc signal abnormalities and facet hypertrophy resulting only mild posterior canal narrowing but no definite compression.  L3-4 shows slightly more prominent asymmetric facet hypertrophy resulting in mild bilateral foraminal narrowing with no definite compression.  L4-5 shows severe facet hypertrophy resulting in moderate bilateral foraminal narrowing but no definite nerve encroachment.  L5-S1 shows loss of disc height and mild facet hypertrophy but no definite compression.  Visualized paraspinal soft tissues been remarkable.  Conus medullaris terminates at L1.   Mildly abnormal MRI scan lumbar spine showing prominent facet hypertrophic changes throughout most severe at L4-5 there is moderate bilateral foraminal narrowing.  and at L3-4 there is mild bilateral foraminal narrowing but no definite compression. INTERPRETING PHYSICIAN: Antony Contras, MD Certified in  Neuroimaging by [place] [facility] and [employer] for Neurological Subspecialities    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 Bone marrow biopsy in 2009 was reportedly normal. This was done in [place]. -CTAP showed fatty infiltration of the liver with normal  spleen. -Work-up for nutritional deficiencies, plasma cell disorder was negative. Infectious etiology was negative. LDH was normal. -Platelet count dropped to 29 on 01/26/2020 and was treated with dexamethasone for 4 days. -Dexamethasone made to him have epigastric pain, indigestion, burping and hiccups. -Peak platelet count reached 108 on 01/31/2020. -As he cannot tolerate dexamethasone, options include Promacta.   PLAN:  1. Immune mediated thrombocytopenia: -Denies any nosebleeds or gum bleeds or any other bleeding. -Easy bruising of the upper extremities is stable. -Reviewed labs from 11/20/2020.  Platelet count improved to 46 from 30 around December 1. -He does not require any treatment intervention at this time. -RTC 3 months with labs.  He was told to come back sooner should he develop any spontaneous bleeding.  Orders placed this encounter:  No orders of the defined types were placed in this encounter.    Derek Jack, MD [place] [phone]   I, Milinda Antis, am acting as a scribe for Dr. Sanda Linger.  I, Derek Jack MD, have reviewed the above documentation for accuracy and completeness, and I agree with the above.

## 2020-12-01 ENCOUNTER — Encounter: Payer: Self-pay | Admitting: Psychology

## 2020-12-01 ENCOUNTER — Other Ambulatory Visit: Payer: Self-pay

## 2020-12-01 ENCOUNTER — Encounter: Payer: Medicare Other | Attending: Psychology | Admitting: Psychology

## 2020-12-01 DIAGNOSIS — R4189 Other symptoms and signs involving cognitive functions and awareness: Secondary | ICD-10-CM | POA: Diagnosis not present

## 2020-12-01 DIAGNOSIS — R413 Other amnesia: Secondary | ICD-10-CM
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
## 2020-12-04 NOTE — Telephone Encounter (Signed)
error 

## 2020-12-15 DIAGNOSIS — D519 Vitamin B12 deficiency anemia, unspecified: Secondary | ICD-10-CM | POA: Diagnosis not present

## 2020-12-23 ENCOUNTER — Ambulatory Visit: Payer: Medicare Other | Admitting: Psychology

## 2020-12-25 ENCOUNTER — Other Ambulatory Visit: Payer: Self-pay

## 2020-12-25 ENCOUNTER — Ambulatory Visit (INDEPENDENT_AMBULATORY_CARE_PROVIDER_SITE_OTHER): Payer: Medicare Other | Admitting: Urology

## 2020-12-25 ENCOUNTER — Encounter: Payer: Self-pay | Admitting: Urology

## 2020-12-25 VITALS — BP 168/81 | HR 82 | Temp 99.3°F | Ht 71.0 in | Wt 263.0 lb

## 2020-12-25 DIAGNOSIS — R3915 Urgency of urination: Secondary | ICD-10-CM

## 2020-12-25 DIAGNOSIS — R972 Elevated prostate specific antigen [PSA]: Secondary | ICD-10-CM

## 2020-12-25 DIAGNOSIS — N401 Enlarged prostate with lower urinary tract symptoms: Secondary | ICD-10-CM

## 2020-12-25 DIAGNOSIS — N138 Other obstructive and reflux uropathy: Secondary | ICD-10-CM

## 2020-12-25 LAB — MICROSCOPIC EXAMINATION
Epithelial Cells (non renal): NONE SEEN /hpf (ref 0–10)
RBC, Urine: NONE SEEN /hpf (ref 0–2)
Renal Epithel, UA: NONE SEEN /hpf
WBC, UA: NONE SEEN /hpf (ref 0–5)

## 2020-12-25 LAB — URINALYSIS, ROUTINE W REFLEX MICROSCOPIC
Bilirubin, UA: NEGATIVE
Glucose, UA: NEGATIVE
Leukocytes,UA: NEGATIVE
Nitrite, UA: NEGATIVE
RBC, UA: NEGATIVE
Specific Gravity, UA: 1.025 (ref 1.005–1.030)
Urobilinogen, Ur: 0.2 mg/dL (ref 0.2–1.0)
pH, UA: 5.5 (ref 5.0–7.5)

## 2020-12-25 LAB — BLADDER SCAN: Scan Result: 0

## 2020-12-25 MED ORDER — GEMTESA 75 MG PO TABS
75.0000 mg | ORAL_TABLET | Freq: Every day | ORAL | 0 refills | Status: DC
Start: 2020-12-25 — End: 2021-01-29

## 2020-12-25 NOTE — Progress Notes (Signed)
Subjective: 1. Elevated PSA   2. BPH with urinary obstruction   3. Urgency of urination      Consult requested by Dr. Wende Neighbors.  Brian [name] is a 80 yo make who is sent for evaluation of an elevated PSA of 4.7 on 11/05/20.  Brian [name] reports Brian [name] had a similar level about 3 months prior.  I had seen him in the past with the last visit in 2018 for BPH with BOO and OAB symptoms.  Brian [name] was on tamsulosin and finasteride at that time.   Brian [name] is no longer on those meds.  His IPSS is 13 with severe urgency with UUI.  Brian [name] has some frequency and nocturia x 2.  Brian [name] has some AM intermittency but feels Brian [name] empties. Brian [name] has no hematuria or dysuria.  Brian [name] had a UTI last summer.   PVR is minimal.  ROS:  ROS  Allergies  Allergen Reactions  . Contrast Media [Iodinated Diagnostic Agents] Other (See Comments)    Allergic to MRI contrast. Eyes swelling very badly   . Penicillin G   . Penicillins Other (See Comments)    dizziness    Past Medical History:  Diagnosis Date  . Arthritis   . Atypical chest pain    Low-risk exercise stress Cardiolite EF 60% April 2008. Reportedly normal remote cardiac catheterization @ [facility] EF 60%...nuclear....2008  . Bradycardia    hospital 03/2010 stopped beta blocker  . Complication of anesthesia   . Diabetes mellitus without complication ([place])   . Dyslipidemia   . Hypertension   . Mild aortic regurgitation with LV dilation by prior echocardiogram   . Neuropathy    in feet  . Obesity   . Obstructive sleep apnea    C-Pap compliant  . PONV (postoperative nausea and vomiting)   . Thrombocytopenia (HCC)    mild follow Dr. Alonna Minium  . Vertigo    Hospital 03/2010    Past Surgical History:  Procedure Laterality Date  . CARDIAC CATHETERIZATION    . KNEE ARTHROSCOPY WITH MEDIAL MENISECTOMY Left 05/31/2014   Procedure: LEFT KNEE ARTHROSCOPY WITH MEDIAL MENISECTOMY;  Surgeon: Carole Civil, MD;  Location: [facility];  Service: Orthopedics;  Laterality: Left;  . Status post bilateral  arthroscopic knee surgery    . 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 Marital status: Married    Spouse name: Not on file  . Number of children: 2  . Years of education: Not on file  . Highest education level: Some college, no degree  Occupational History  . Occupation: retired  Tobacco Use  . Smoking status: Never Smoker  . Smokeless tobacco: Never Used  Vaping Use  . Vaping Use: Never used  Substance and Sexual Activity  . Alcohol use: Not Currently    Comment: wine on occasion  . Drug use: No  . Sexual activity: Not Currently  Other Topics Concern  . Not on file  Social History Narrative   Lives at home with wife   Right handed   Caffeine: Diet coke roughly 12 oz in a day    Social Determinants of Health   Financial Resource Strain: Low Risk   . Difficulty of Paying Living Expenses: Not hard at all  Food Insecurity: No Food Insecurity  . Worried About Charity fundraiser in the Last  Year: Never true  . Ran Out of Food in the Last Year: Never true  Transportation Needs: No Transportation Needs  . Lack of Transportation (Medical): No  . Lack of Transportation (Non-Medical): No  Physical Activity: Inactive  . Days of Exercise per Week: 0 days  . Minutes of Exercise per Session: 0 min  Stress: No Stress Concern Present  . Feeling of Stress : Not at all  Social Connections: Moderately Integrated  . Frequency of Communication with Friends and Family: More than three times a week  . Frequency of Social Gatherings with Friends and Family: Three times a week  . Attends Religious Services: 1 to 4 times per year  . Active Member of Clubs or Organizations: No  . Attends Archivist Meetings: Never  . Marital Status: Married  Human resources officer Violence: Not At Risk  . Fear of Current  or Ex-Partner: No  . Emotionally Abused: No  . Physically Abused: No  . Sexually Abused: No    Family History  Problem Relation Age of Onset  . Heart attack Mother        fatal, cause of death  . Heart attack Maternal Grandmother   . Memory loss Neg Hx     Anti-infectives: Anti-infectives (From admission, onward)   None      Current Outpatient Medications  Medication Sig Dispense Refill  . acetaminophen (TYLENOL) 500 MG tablet Take 1,000 mg by mouth every 6 (six) hours as needed for mild pain or moderate pain.     Marland Kitchen amLODipine (NORVASC) 10 MG tablet Take 10 mg by mouth daily.     Marland Kitchen atorvastatin (LIPITOR) 20 MG tablet Take 20 mg by mouth daily.    . B Complex Vitamins (VITAMIN B-COMPLEX) TABS Take 1 tablet by mouth daily.     . cyanocobalamin (,VITAMIN B-12,) 1000 MCG/ML injection every 30 (thirty) days.    . fluticasone (FLONASE) 50 MCG/ACT nasal spray Place 2 sprays into both nostrils at bedtime.     . gabapentin (NEURONTIN) 100 MG capsule Take 100-300 mg by mouth at bedtime as needed (for pain).     Marland Kitchen glipiZIDE (GLUCOTROL XL) 5 MG 24 hr tablet Take 5 mg by mouth 2 (two) times daily.    . Glucosamine-Chondroit-Vit C-Mn (GLUCOSAMINE 1500 COMPLEX PO) Take 1 tablet by mouth daily.     . Glucose Blood (GNP TRUE METRIX GLUCOSE STRIPS VI) USE THREE TIMES DAILY    . meclizine (ANTIVERT) 25 MG tablet Take 1 tablet (25 mg total) by mouth 3 (three) times daily as needed for dizziness. may cause drowsiness 21 tablet 0  . Melatonin 10 MG CAPS Take 10 mg by mouth at bedtime.     . metFORMIN (GLUCOPHAGE) 500 MG tablet Take 500 mg by mouth 4 (four) times daily.     . mupirocin ointment (BACTROBAN) 2 % Apply 1 application topically 3 times/day as needed-between meals & bedtime.    Marland Kitchen olmesartan (BENICAR) 40 MG tablet Take 40 mg by mouth daily.     . vitamin B-12 (CYANOCOBALAMIN) 1000 MCG tablet Take 1,000 mcg by mouth daily.     No current facility-administered medications for this visit.      Objective: Vital signs in last 24 hours: BP (!) 168/81   Pulse 82   Temp 99.3 F (37.4 C)   Ht 5\' 11"  (1.803 m)   Wt 263 lb (119.3 kg)   BMI 36.68 kg/m   Intake/Output from previous day: No intake/output data recorded. Intake/Output this  shift: @IOTHISSHIFT @   Physical Exam Vitals reviewed.  Constitutional:      Appearance: Normal appearance. Brian [name] is obese.  Cardiovascular:     Rate and Rhythm: Normal rate and regular rhythm.     Heart sounds: Normal heart sounds.  Pulmonary:     Effort: Pulmonary effort is normal.     Breath sounds: Normal breath sounds.  Abdominal:     Palpations: Abdomen is soft.     Tenderness: There is no abdominal tenderness.     Hernia: No hernia is present.  Genitourinary:    Comments: Normal penis, scrotum, testes and epididymis.  AP without lesions. NST without mass. Prostate 1.5+ benign. SV non-palpable.  Musculoskeletal:        General: No swelling or tenderness. Normal range of motion.  Skin:    General: Skin is warm and dry.  Neurological:     General: No focal deficit present.     Mental Status: Brian [name] is alert and oriented to person, place, and time.  Psychiatric:        Mood and Affect: Mood normal.        Behavior: Behavior normal.     Lab Results:  Results for orders placed or performed in visit on 12/25/20 (from the past 24 hour(s))  Urinalysis, Routine w reflex microscopic     Status: Abnormal   Collection Time: 12/25/20  2:09 PM  Result Value Ref Range   Specific Gravity, UA 1.025 1.005 - 1.030   pH, UA 5.5 5.0 - 7.5   Color, UA Yellow Yellow   Appearance Ur Clear Clear   Leukocytes,UA Negative Negative   Protein,UA 2+ (A) Negative/Trace   Glucose, UA Negative Negative   Ketones, UA 1+ (A) Negative   RBC, UA Negative Negative   Bilirubin, UA Negative Negative   Urobilinogen, Ur 0.2 0.2 - 1.0 mg/dL   Nitrite, UA Negative Negative   Microscopic Examination See below:    Narrative   Performed at:  [facility] [address] [place], [place]  [id number] Lab Director: Mina Marble MT, Phone:  [phone]  Microscopic Examination     Status: None   Collection Time: 12/25/20  2:09 PM   Urine  Result Value Ref Range   WBC, UA None seen 0 - 5 /hpf   RBC None seen 0 - 2 /hpf   Epithelial Cells (non renal) None seen 0 - 10 /hpf   Renal Epithel, UA None seen None seen /hpf   Mucus, UA Present Not Estab.   Bacteria, UA Few None seen/Few   Narrative   Performed at:  [facility] [address] [place], [place]  [id number] Lab Director: Drew, Phone:  [phone]    BMET No results for input(s): NA, K, CL, CO2, GLUCOSE, BUN, CREATININE, CALCIUM in the last 72 hours. PT/INR No results for input(s): LABPROT, INR in the last 72 hours. ABG No results for input(s): PHART, HCO3 in the last 72 hours.  Invalid input(s): PCO2, PO2  Studies/Results: No results found. Outside records reviewed.   Assessment/Plan: Elevated PSA.  His exam is unremarkable and a PSA of 4.7 is not unreasonable in a 80 yo.  I will repeat the PSA in 6 mo to make sure it is not rising rapidly.  BPH with BOO and OAB.  I discussed options including avoiding dietary irritants, behavioral therapy, anticholinergics and beta 3 agonists and neuromodulation.   With his underlying medical issues, I will try Gemtesa and  have given samples.  Brian [name] had tried reducing dietary irritants without noticeable impact.   No orders of the defined types were placed in this encounter.    Orders Placed This Encounter  Procedures  . Microscopic Examination  . Urinalysis, Routine w reflex microscopic  . PSA, total and free    Standing Status:   Future    Standing Expiration Date:   12/25/2021  . Bladder scan     Return in about 4 weeks (around 01/22/2021).    CC: Dr. Wende Neighbors.      Irine Seal 12/25/2020 [phone]

## 2020-12-25 NOTE — Progress Notes (Signed)
Urological Symptom Review  Patient is experiencing the following symptoms: Frequent urination Hard to postpone urination Get up at night to urinate Leakage of urine   Review of Systems  Gastrointestinal (upper)  : Negative for upper GI symptoms  Gastrointestinal (lower) : Negative for lower GI symptoms  Constitutional : Fatigue  Skin: Negative for skin symptoms  Eyes: Negative for eye symptoms  Ear/Nose/Throat : Negative for Ear/Nose/Throat symptoms  Hematologic/Lymphatic: Easy bruising  Cardiovascular : Negative for cardiovascular symptoms  Respiratory : Cough  Endocrine: Negative for endocrine symptoms  Musculoskeletal: Negative for musculoskeletal symptoms  Neurological: Dizziness  Psychologic: Negative for psychiatric symptoms

## 2020-12-25 NOTE — Progress Notes (Signed)
Bladder Scan Patient can void: 0 ml Performed By: Estill Bamberg RN

## 2020-12-31 DIAGNOSIS — R32 Unspecified urinary incontinence: Secondary | ICD-10-CM | POA: Diagnosis not present

## 2020-12-31 DIAGNOSIS — R809 Proteinuria, unspecified: Secondary | ICD-10-CM | POA: Diagnosis not present

## 2020-12-31 DIAGNOSIS — I129 Hypertensive chronic kidney disease with stage 1 through stage 4 chronic kidney disease, or unspecified chronic kidney disease: Secondary | ICD-10-CM | POA: Diagnosis not present

## 2020-12-31 DIAGNOSIS — E1129 Type 2 diabetes mellitus with other diabetic kidney complication: Secondary | ICD-10-CM | POA: Diagnosis not present

## 2020-12-31 DIAGNOSIS — E1165 Type 2 diabetes mellitus with hyperglycemia: Secondary | ICD-10-CM | POA: Diagnosis not present

## 2020-12-31 DIAGNOSIS — D696 Thrombocytopenia, unspecified: Secondary | ICD-10-CM | POA: Diagnosis not present

## 2020-12-31 DIAGNOSIS — N401 Enlarged prostate with lower urinary tract symptoms: Secondary | ICD-10-CM | POA: Diagnosis not present

## 2020-12-31 DIAGNOSIS — I10 Essential (primary) hypertension: Secondary | ICD-10-CM | POA: Diagnosis not present

## 2020-12-31 DIAGNOSIS — N182 Chronic kidney disease, stage 2 (mild): Secondary | ICD-10-CM | POA: Diagnosis not present

## 2020-12-31 DIAGNOSIS — E119 Type 2 diabetes mellitus without complications: Secondary | ICD-10-CM | POA: Diagnosis not present

## 2020-12-31 DIAGNOSIS — N529 Male erectile dysfunction, unspecified: Secondary | ICD-10-CM | POA: Diagnosis not present

## 2020-12-31 DIAGNOSIS — D519 Vitamin B12 deficiency anemia, unspecified: Secondary | ICD-10-CM | POA: Diagnosis not present

## 2021-01-02 ENCOUNTER — Telehealth: Payer: Self-pay

## 2021-01-02 DIAGNOSIS — D696 Thrombocytopenia, unspecified: Secondary | ICD-10-CM | POA: Diagnosis not present

## 2021-01-02 DIAGNOSIS — G9009 Other idiopathic peripheral autonomic neuropathy: Secondary | ICD-10-CM | POA: Diagnosis not present

## 2021-01-02 DIAGNOSIS — R2689 Other abnormalities of gait and mobility: Secondary | ICD-10-CM | POA: Diagnosis not present

## 2021-01-02 DIAGNOSIS — E782 Mixed hyperlipidemia: Secondary | ICD-10-CM | POA: Diagnosis not present

## 2021-01-02 DIAGNOSIS — G3184 Mild cognitive impairment, so stated: Secondary | ICD-10-CM | POA: Diagnosis not present

## 2021-01-02 DIAGNOSIS — N182 Chronic kidney disease, stage 2 (mild): Secondary | ICD-10-CM | POA: Diagnosis not present

## 2021-01-02 DIAGNOSIS — I129 Hypertensive chronic kidney disease with stage 1 through stage 4 chronic kidney disease, or unspecified chronic kidney disease: Secondary | ICD-10-CM | POA: Diagnosis not present

## 2021-01-02 DIAGNOSIS — R809 Proteinuria, unspecified: Secondary | ICD-10-CM | POA: Diagnosis not present

## 2021-01-02 DIAGNOSIS — G4733 Obstructive sleep apnea (adult) (pediatric): Secondary | ICD-10-CM | POA: Diagnosis not present

## 2021-01-02 DIAGNOSIS — E1165 Type 2 diabetes mellitus with hyperglycemia: Secondary | ICD-10-CM | POA: Diagnosis not present

## 2021-01-02 DIAGNOSIS — Z9181 History of falling: Secondary | ICD-10-CM | POA: Diagnosis not present

## 2021-01-02 DIAGNOSIS — E1140 Type 2 diabetes mellitus with diabetic neuropathy, unspecified: Secondary | ICD-10-CM | POA: Diagnosis not present

## 2021-01-02 NOTE — Telephone Encounter (Signed)
thanks

## 2021-01-02 NOTE — Telephone Encounter (Signed)
Dr. [name] [name] called our office today to inquire if recently gave patient creon samples for urinary incontinence.   Per last OV note Dr. Jeffie Pollock gave [name] samples for his urinary issues. I relayed that information to Dr Brian [name] of [name] was given and we do not sample Creon. Creon also is not on the patients [facility] list.

## 2021-01-16 ENCOUNTER — Encounter: Payer: Self-pay | Admitting: Psychology

## 2021-01-16 ENCOUNTER — Encounter: Payer: Medicare Other | Attending: Psychology | Admitting: Psychology

## 2021-01-16 ENCOUNTER — Other Ambulatory Visit: Payer: Self-pay

## 2021-01-16 DIAGNOSIS — D519 Vitamin B12 deficiency anemia, unspecified: Secondary | ICD-10-CM | POA: Diagnosis not present

## 2021-01-16 DIAGNOSIS — R413 Other amnesia: Secondary | ICD-10-CM | POA: Insufficient documentation

## 2021-01-16 NOTE — Progress Notes (Signed)
   Neuropsychology Note  Brian [name] completed 240 minutes of neuropsychological testing with this provider Health and safety inspector). He appeared his stated age. His dress was appropriate and he was heavy set. Well groomed and appropriate His participation was indicative of Appropriate and Attentive behaviors. There were no physical disabilities noted related to gait and he ambulated without difficulty. He displayed an appropriate level of cooperation and motivation. The patient did not appear overtly distressed by the testing session, per behavioral observation or via self-report. Rest breaks were offered.    Clinical Decision Making: In considering the patient's current level of functioning, level of presumed impairment, nature of symptoms, emotional and behavioral responses during the interview, level of literacy, and observed level of motivation/effort, a battery of tests was selected. Changes were made as deemed necessary based on patient performance on testing, behavioral observations and additional pertinent factors such as those listed above.   Tests Administered:  [place] (BNT)  Controlled Oral Word Association Test (COWAT; [employer] Animals  Finger Tapping Test (FTT)  Grooved Pegboard  Wechsler Adult Intelligence Scale, 4th Edition (WAIS-IV)   Wechsler Memory Scale, 4th Edition (WMS-IV); Older Adult Battery   Results: To be included once scored  Feedback to Patient: Brian [name] will return on 01/28/21  for an interactive feedback session with at which time his test performances, clinical impressions and treatment recommendations will be reviewed in detail. The patient understands he can contact our office should he require our assistance before this time.  Full report to follow.

## 2021-01-21 DIAGNOSIS — R809 Proteinuria, unspecified: Secondary | ICD-10-CM | POA: Diagnosis not present

## 2021-01-21 DIAGNOSIS — E782 Mixed hyperlipidemia: Secondary | ICD-10-CM | POA: Diagnosis not present

## 2021-01-21 DIAGNOSIS — G9009 Other idiopathic peripheral autonomic neuropathy: Secondary | ICD-10-CM | POA: Diagnosis not present

## 2021-01-21 DIAGNOSIS — R2689 Other abnormalities of gait and mobility: Secondary | ICD-10-CM | POA: Diagnosis not present

## 2021-01-21 DIAGNOSIS — Z6835 Body mass index (BMI) 35.0-35.9, adult: Secondary | ICD-10-CM | POA: Diagnosis not present

## 2021-01-21 DIAGNOSIS — Z9181 History of falling: Secondary | ICD-10-CM | POA: Diagnosis not present

## 2021-01-21 DIAGNOSIS — N182 Chronic kidney disease, stage 2 (mild): Secondary | ICD-10-CM | POA: Diagnosis not present

## 2021-01-21 DIAGNOSIS — G4733 Obstructive sleep apnea (adult) (pediatric): Secondary | ICD-10-CM | POA: Diagnosis not present

## 2021-01-21 DIAGNOSIS — I129 Hypertensive chronic kidney disease with stage 1 through stage 4 chronic kidney disease, or unspecified chronic kidney disease: Secondary | ICD-10-CM | POA: Diagnosis not present

## 2021-01-21 DIAGNOSIS — D696 Thrombocytopenia, unspecified: Secondary | ICD-10-CM | POA: Diagnosis not present

## 2021-01-21 DIAGNOSIS — G3184 Mild cognitive impairment, so stated: Secondary | ICD-10-CM | POA: Diagnosis not present

## 2021-01-22 ENCOUNTER — Encounter (HOSPITAL_BASED_OUTPATIENT_CLINIC_OR_DEPARTMENT_OTHER): Payer: Medicare Other | Admitting: Psychology

## 2021-01-22 ENCOUNTER — Other Ambulatory Visit: Payer: Self-pay

## 2021-01-22 DIAGNOSIS — I6381 Other cerebral infarction due to occlusion or stenosis of small artery: Secondary | ICD-10-CM | POA: Diagnosis not present

## 2021-01-22 DIAGNOSIS — R269 Unspecified abnormalities of gait and mobility: Secondary | ICD-10-CM

## 2021-01-22 DIAGNOSIS — Z789 Other specified health status: Secondary | ICD-10-CM | POA: Diagnosis not present

## 2021-01-22 DIAGNOSIS — R413 Other amnesia: Secondary | ICD-10-CM | POA: Diagnosis not present

## 2021-01-23 ENCOUNTER — Encounter: Payer: Self-pay | Admitting: Psychology

## 2021-01-23 NOTE — Progress Notes (Addendum)
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 and foods made from whole grains (e.g., whole wheat bread and pasta)  . Consume a significant amount of omega-3 essential fats and oils.  These can be found in natural food sources such as salmon and other fatty fish, and also products made from flax seed and flax seed oil.  Alternately, dietary supplementation with fish oil capsules and flax seed oil capsules is a good way to boost one's level of omega-3 consumption.  It is important to check with your doctor before taking these supplements, especially if you take blood thinning medication.  . If you do not already do so, consider taking a quality multivitamin supplement under the guidance of your primary care physician.   . Consider keeping consumption of the following foods to a minimum: 1) foods made from white flour and white sugar; 2) artificial sweeteners; 3) deep-fried foods; 4) animal fat other than fish; 5) any foods containing "hydrogenated" or "trans" fats; 6) most other types of highly processed packaged/prepared foods.    Diagnosis:                             No impairment of memory ; Multiple lacunar infarcts, Abnormality of gait  Feedback to Patient:  Brian [name] will return for a feedback appointment on 01/28/2021  to review the results of his neuropsychological evaluation with this provider.   Thank you for your referral  of Brian [name]. Please feel free to contact me if you have any questions or concerns regarding this report.

## 2021-01-28 ENCOUNTER — Encounter: Payer: Self-pay | Admitting: Psychology

## 2021-01-28 ENCOUNTER — Other Ambulatory Visit: Payer: Self-pay

## 2021-01-28 ENCOUNTER — Encounter: Payer: Medicare Other | Attending: Psychology | Admitting: Psychology

## 2021-01-28 DIAGNOSIS — I6381 Other cerebral infarction due to occlusion or stenosis of small artery: Secondary | ICD-10-CM | POA: Insufficient documentation

## 2021-01-28 DIAGNOSIS — Z789 Other specified health status: Secondary | ICD-10-CM | POA: Insufficient documentation

## 2021-01-28 DIAGNOSIS — R269 Unspecified abnormalities of gait and mobility: Secondary | ICD-10-CM | POA: Diagnosis not present

## 2021-01-28 NOTE — Progress Notes (Addendum)
Neuropsychology Feedback Appointment  Brian [name] and his wife returned for a feedback appointment today to review the results of his recent neuropsychological evaluation with this provider. 60 minutes face-to-face time was spent reviewing his test results, my impressions and my recommendations as detailed in his report. Education was provided about lacunar infarcts and vascular risk factors (I.e., hypertension, OSA, diabetes, dyslipidemia, obesity, gait abnormality). The patient and wife were given the opportunity to ask questions, and I did my best to answer these to their satisfaction.   Below is a copy of the Summary of Testing Results, and my clinical impressions and recommendations from the evaluation dated 01/22/21 in EMR. Initial consultation note can be found on 12/01/20 ___________________________________________________________________________________________________  Summary of Testing Results: Overall, the results of the patient's objective neuropsychological assessment showed that he is performing in the average to high average range on global cognitive measures with no objective findings of cognitive deficits, with the exception of a relative weakness in basic auditory attention and working memory capacity. There was no pattern of consistent cognitive deficits in any cognitive domain assessed.  He did showed a relative strength on measures of visual spatial and perceptual reasoning, verbal abstract reasoning, mental sequencing, learning and memory, verbal memory both short-term, intermediate and delayed functions. Performance was average and within normal limits on measures of overall information processing speed, language (i.e., confrontation naming, semantic and lexical fluency), fine motor coordination, and aspects of executive functioning involving set shifting and cognitive flexibility   Impression/Diagnosis:                     Results of the current objective neuropsychological  assessment were within normal limits and commensurate with estimated premorbid intellectual abilities.  Specifically, processing speed, auditory attention, language, visual spatial skills, learning and memory, and executive functioning were all normal, with most scores falling in the average to high average range. There were no impaired performances on testing. 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 The combination of abnormal findings on recent MRI of brain, changes in gait, balance and coordination, dizziness, and weakness in extremities are concerning for future vascular insults and cognitive decline, including dementia, if risk factors remain uncontrolled (i.e., hypertension, cholesterol, etc). Despite these concerns, the pattern of performance observed on his profile did not exceed the normal age-related cognitive decline expected for his age and cohort and do not warrant a neurocognitive disorder, even mild, at this time. There is no evidence of a primary psychiatric disorder.   It is possible that there are some functional issues that could be playing a role in his day-to-day functioning such as sleep disturbance. Patient stays up late at night (1-3am) and has trouble falling asleep. He wakes up several times during the night to urinate. He is reportedly compliant with CPAP. His subjective memory complaints may improve to some degree with better sleep hygiene and more restorative sleep. He does not consume alcohol at present nor been treated for abuse/dependence in the past.   We have a very good baseline of current cognitive functioning to compare with future assessments as  needed. He will return for repeat testing in 12-18 months to monitor is cognitive functioning over time.   Recommendations/Plan: Based  on the findings of the present evaluation, the following recommendations are offered:  1.  The patient will be reassured that his testing results are within normal limits and not indicative of dementia at this time. I will review strategies to maintain brain health, including cardiovascular exercise, mental stimulation and social engagement.   2.  Optimal control of vascular risk factors is necessary to reduce the risk of further cognitive decline.   3. The impact of stress on cognitive functioning will be reviewed. He will likely benefit from regular stress management and self care activities.   4.  He may benefit from additional PT and balance training.   5.  Neuropsychological re-evaluation is recommended in approximately one year (or sooner) to monitor treatment efficacy, to assist with the management of the patient, to determine any clinical and functional significance of brain abnormality over time, as well as to document any potential improvement or decline in cognitive functioning. Lastly, any follow-up testing will help delineate the specific cognitive basis of any new functional complaints.   6.   Nutritional factors can have a significant effect on psychological and emotional status, as well as overall brain functioning.  The following general recommendations have been associated with improvements in depression and other psychological symptoms, as well as lower risk for dementia and other forms of cognitive impairment.  Please discuss these recommendations with your physician and/or dietitian before initiating:  . Consume a wide variety of fresh fruits and vegetables, particularly including brightly colored items such as berries, oranges, tomatoes, peppers, carrots, broccoli, spinach, dark green lettuces, sweet potatoes, etc., all of which are high in  vitamins and antioxidants.  . Consume foods that are high in fiber, such as legumes (e.g., beans, peas, lentils) and foods made from whole grains (e.g., whole wheat bread and pasta)  . Consume a significant amount of omega-3 essential fats and oils.  These can be found in natural food sources such as salmon and other fatty fish, and also products made from flax seed and flax seed oil.  Alternately, dietary supplementation with fish oil capsules and flax seed oil capsules is a good way to boost one's level of omega-3 consumption.  It is important to check with your doctor before taking these supplements, especially if you take blood thinning medication.  . If you do not already do so, consider taking a quality multivitamin supplement under the guidance of your primary care physician.   . Consider keeping consumption of the following foods to a minimum: 1) foods made from white flour and white sugar; 2) artificial sweeteners; 3) deep-fried foods; 4) animal fat other than fish; 5) any foods containing "hydrogenated" or "trans" fats; 6) most other types of highly processed packaged/prepared foods.     Diagnosis:                             No impairment of memory; Multiple lacunar infarcts, Abnormality of gait   Thank you for your referral of Brian [name]. Please feel free to contact me if you have any questions or concerns regarding this report.

## 2021-01-29 ENCOUNTER — Ambulatory Visit (INDEPENDENT_AMBULATORY_CARE_PROVIDER_SITE_OTHER): Payer: Medicare Other | Admitting: Urology

## 2021-01-29 ENCOUNTER — Encounter: Payer: Self-pay | Admitting: Urology

## 2021-01-29 VITALS — BP 196/93 | HR 75 | Temp 98.3°F | Ht 71.0 in | Wt 267.0 lb

## 2021-01-29 DIAGNOSIS — N401 Enlarged prostate with lower urinary tract symptoms: Secondary | ICD-10-CM | POA: Diagnosis not present

## 2021-01-29 DIAGNOSIS — I6381 Other cerebral infarction due to occlusion or stenosis of small artery: Secondary | ICD-10-CM

## 2021-01-29 DIAGNOSIS — N138 Other obstructive and reflux uropathy: Secondary | ICD-10-CM | POA: Diagnosis not present

## 2021-01-29 DIAGNOSIS — R3915 Urgency of urination: Secondary | ICD-10-CM | POA: Diagnosis not present

## 2021-01-29 DIAGNOSIS — R972 Elevated prostate specific antigen [PSA]: Secondary | ICD-10-CM

## 2021-01-29 LAB — URINALYSIS, ROUTINE W REFLEX MICROSCOPIC
Bilirubin, UA: NEGATIVE
Glucose, UA: NEGATIVE
Ketones, UA: NEGATIVE
Leukocytes,UA: NEGATIVE
Nitrite, UA: NEGATIVE
Specific Gravity, UA: 1.02 (ref 1.005–1.030)
Urobilinogen, Ur: 0.2 mg/dL (ref 0.2–1.0)
pH, UA: 6 (ref 5.0–7.5)

## 2021-01-29 LAB — MICROSCOPIC EXAMINATION
Epithelial Cells (non renal): NONE SEEN /hpf (ref 0–10)
Renal Epithel, UA: NONE SEEN /hpf
WBC, UA: NONE SEEN /hpf (ref 0–5)

## 2021-01-29 NOTE — Progress Notes (Signed)
Subjective: 1. Elevated PSA      01/29/21: [name] returns today in f/u.  He had an allergic reaction with a rash and dizziness to the [place].  He also didn't notice a benefit.  He has nocturia x 2.  He has urgency but generally can get to the bathroom if he is careful.  He has a good stream and feels he empties but can have some mild intermittecny.    12/25/20: [name] is a 80 yo make who is sent for evaluation of an elevated PSA of 4.7 on 11/05/20.  He reports he had a similar level about 3 months prior.  I had seen him in the past with the last visit in 2018 for BPH with BOO and OAB symptoms.  He was on tamsulosin and finasteride at that time.   He is no longer on those meds.  His IPSS is 13 with severe urgency with UUI.  He has some frequency and nocturia x 2.  He has some AM intermittency but feels he empties. He has no hematuria or dysuria.  He had a UTI last summer.   PVR is minimal.  ROS:  ROS  Allergies  Allergen Reactions  . Contrast Media [Iodinated Diagnostic Agents] Other (See Comments)    Allergic to MRI contrast. Eyes swelling very badly   . Penicillin G   . Penicillins Other (See Comments)    dizziness  . Gemtesa [Vibegron] Rash    Patient reports red spots on his arms and itching    Past Medical History:  Diagnosis Date  . Arthritis   . Atypical chest pain    Low-risk exercise stress Cardiolite EF 60% April 2008. Reportedly normal remote cardiac catheterization @ [facility] EF 60%...nuclear....2008  . Bradycardia    hospital 03/2010 stopped beta blocker  . Complication of anesthesia   . Diabetes mellitus without complication ([place])   . Dyslipidemia   . Hypertension   . Mild aortic regurgitation with LV dilation by prior echocardiogram   . Neuropathy    in feet  . Obesity   . Obstructive sleep apnea    C-Pap compliant  . PONV (postoperative nausea and vomiting)   . Thrombocytopenia ([facility])    mild follow Dr. Alonna Minium  . Vertigo    Hospital 03/2010    Past Surgical  History:  Procedure Laterality Date  . CARDIAC CATHETERIZATION    . KNEE ARTHROSCOPY WITH MEDIAL MENISECTOMY Left 05/31/2014   Procedure: LEFT KNEE ARTHROSCOPY WITH MEDIAL MENISECTOMY;  Surgeon: Carole Civil, MD;  Location: [facility];  Service: Orthopedics;  Laterality: Left;  . Status post bilateral arthroscopic knee surgery    . 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 Marital status: Married    Spouse name: Not on file  . Number of children: 2  . Years of education: Not on file  . Highest education level: Some college, no degree  Occupational History  . Occupation: retired  Tobacco Use  . Smoking status: Never Smoker  . Smokeless tobacco: Never Used  Vaping Use  . Vaping Use: Never used  Substance and Sexual Activity  . Alcohol use: Not Currently    Comment: wine on occasion  . Drug use: No  . Sexual activity: Not Currently  Other Topics Concern  . Not on file  Social History  Narrative   Lives at home with wife   Right handed   Caffeine: Diet coke roughly 12 oz in a day    Social Determinants of Health   Financial Resource Strain: Low Risk   . Difficulty of Paying Living Expenses: Not hard at all  Food Insecurity: No Food Insecurity  . Worried About Charity fundraiser in the Last Year: Never true  . Ran Out of Food in the Last Year: Never true  Transportation Needs: No Transportation Needs  . Lack of Transportation (Medical): No  . Lack of Transportation (Non-Medical): No  Physical Activity: Inactive  . Days of Exercise per Week: 0 days  . Minutes of Exercise per Session: 0 min  Stress: No Stress Concern Present  . Feeling of Stress : Not at all  Social Connections: Moderately Integrated  . Frequency of Communication with Friends and Family: More than three times a week  .  Frequency of Social Gatherings with Friends and Family: Three times a week  . Attends Religious Services: 1 to 4 times per year  . Active Member of Clubs or Organizations: No  . Attends Archivist Meetings: Never  . Marital Status: Married  Human resources officer Violence: Not At Risk  . Fear of Current or Ex-Partner: No  . Emotionally Abused: No  . Physically Abused: No  . Sexually Abused: No    Family History  Problem Relation Age of Onset  . Heart attack Mother        fatal, cause of death  . Heart attack Maternal Grandmother   . Memory loss Neg Hx     Anti-infectives: Anti-infectives (From admission, onward)   None      Current Outpatient Medications  Medication Sig Dispense Refill  . acetaminophen (TYLENOL) 500 MG tablet Take 1,000 mg by mouth every 6 (six) hours as needed for mild pain or moderate pain.     Marland Kitchen amLODipine (NORVASC) 10 MG tablet Take 10 mg by mouth daily.     Marland Kitchen atorvastatin (LIPITOR) 20 MG tablet Take 20 mg by mouth daily.    . B Complex Vitamins (VITAMIN B-COMPLEX) TABS Take 1 tablet by mouth daily.     . cyanocobalamin (,VITAMIN B-12,) 1000 MCG/ML injection every 30 (thirty) days.    . fluticasone (FLONASE) 50 MCG/ACT nasal spray Place 2 sprays into both nostrils at bedtime.     . gabapentin (NEURONTIN) 100 MG capsule Take 100-300 mg by mouth at bedtime as needed (for pain).     Marland Kitchen glipiZIDE (GLUCOTROL XL) 5 MG 24 hr tablet Take 5 mg by mouth 2 (two) times daily.    . Glucosamine-Chondroit-Vit C-Mn (GLUCOSAMINE 1500 COMPLEX PO) Take 1 tablet by mouth daily.     . Glucose Blood (GNP TRUE METRIX GLUCOSE STRIPS VI) USE THREE TIMES DAILY    . meclizine (ANTIVERT) 25 MG tablet Take 1 tablet (25 mg total) by mouth 3 (three) times daily as needed for dizziness. may cause drowsiness 21 tablet 0  . Melatonin 10 MG CAPS Take 10 mg by mouth at bedtime.     . metFORMIN (GLUCOPHAGE) 500 MG tablet Take 500 mg by mouth 4 (four) times daily.     . mupirocin ointment  (BACTROBAN) 2 % Apply 1 application topically 3 times/day as needed-between meals & bedtime.    Marland Kitchen olmesartan (BENICAR) 40 MG tablet Take 40 mg by mouth daily.     . vitamin B-12 (CYANOCOBALAMIN) 1000 MCG tablet Take 1,000 mcg by mouth daily.  No current facility-administered medications for this visit.     Objective: Vital signs in last 24 hours: BP (!) 196/93   Pulse 75   Temp 98.3 F (36.8 C)   Ht 5\' 11"  (1.803 m)   Wt 267 lb (121.1 kg)   BMI 37.24 kg/m   Intake/Output from previous day: No intake/output data recorded. Intake/Output this shift: @IOTHISSHIFT @   Physical Exam  Lab Results:  No results found for this or any previous visit (from the past 24 hour(s)).  BMET No results for input(s): NA, K, CL, CO2, GLUCOSE, BUN, CREATININE, CALCIUM in the last 72 hours. PT/INR No results for input(s): LABPROT, INR in the last 72 hours. ABG No results for input(s): PHART, HCO3 in the last 72 hours.  Invalid input(s): PCO2, PO2   UA is unremarkable.   Studies/Results: No results found.  Assessment/Plan: Elevated PSA.  His exam is unremarkable and a PSA of 4.7 is not unreasonable in a 80 yo.  I will repeat the PSA in 6 mo to make sure it is not rising rapidly.  BPH with BOO and OAB.   He had side effects and no benefit with Gemtesa.  I discussed alternative treatments but he doesn't want to pursue any active treatment at this time.   No orders of the defined types were placed in this encounter.    Orders Placed This Encounter  Procedures  . Urinalysis, Routine w reflex microscopic     Return in about 6 months (around 07/31/2021) for with psa.    CC: Dr. Wende Neighbors.      Irine Seal 01/29/2021 [phone] ID: Eulis Manly, male   DOB: 1941/01/22, 80 y.o.   MRN: [id number]

## 2021-01-29 NOTE — Progress Notes (Signed)
Subjective: 1. Elevated PSA   2. BPH with urinary obstruction   3. Urgency of urination      Consult requested by Dr. Wende Neighbors.  [name] is a 80 yo make who is sent for evaluation of an elevated PSA of 4.7 on 11/05/20.  He reports he had a similar level about 3 months prior.  I had seen him in the past with the last visit in 2018 for BPH with BOO and OAB symptoms.  He was on tamsulosin and finasteride at that time.   He is no longer on those meds.  His IPSS is 13 with severe urgency with UUI.  He has some frequency and nocturia x 2.  He has some AM intermittency but feels he empties. He has no hematuria or dysuria.  He had a UTI last summer.   PVR is minimal.  ROS:  ROS  Allergies  Allergen Reactions  . Contrast Media [Iodinated Diagnostic Agents] Other (See Comments)    Allergic to MRI contrast. Eyes swelling very badly   . Penicillin G   . Penicillins Other (See Comments)    dizziness  . Gemtesa [Vibegron] Rash    Patient reports red spots on his arms and itching    Past Medical History:  Diagnosis Date  . Arthritis   . Atypical chest pain    Low-risk exercise stress Cardiolite EF 60% April 2008. Reportedly normal remote cardiac catheterization @ [facility] EF 60%...nuclear....2008  . Bradycardia    hospital 03/2010 stopped beta blocker  . Complication of anesthesia   . Diabetes mellitus without complication ([place])   . Dyslipidemia   . Hypertension   . Mild aortic regurgitation with LV dilation by prior echocardiogram   . Neuropathy    in feet  . Obesity   . Obstructive sleep apnea    C-Pap compliant  . PONV (postoperative nausea and vomiting)   . Thrombocytopenia (HCC)    mild follow Dr. Alonna Minium  . Vertigo    Hospital 03/2010    Past Surgical History:  Procedure Laterality Date  . CARDIAC CATHETERIZATION    . KNEE ARTHROSCOPY WITH MEDIAL MENISECTOMY Left 05/31/2014   Procedure: LEFT KNEE ARTHROSCOPY WITH MEDIAL MENISECTOMY;  Surgeon: Carole Civil, MD;  Location: [facility];  Service: Orthopedics;  Laterality: Left;  . Status post bilateral arthroscopic knee surgery    . 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 Marital status: Married    Spouse name: Not on file  . Number of children: 2  . Years of education: Not on file  . Highest education level: Some college, no degree  Occupational History  . Occupation: retired  Tobacco Use  . Smoking status: Never Smoker  . Smokeless tobacco: Never Used  Vaping Use  . Vaping Use: Never used  Substance and Sexual Activity  . Alcohol use: Not Currently    Comment: wine on occasion  . Drug use: No  . Sexual activity: Not Currently  Other Topics Concern  . Not on file  Social History Narrative   Lives at home with wife   Right handed   Caffeine: Diet coke roughly 12 oz in a day    Social Determinants of Health   Financial Resource Strain: Low Risk   . Difficulty of Paying Living Expenses: Not hard at all  Food Insecurity: No Food Insecurity  . Worried About Charity fundraiser in the Last Year: Never true  . Ran Out of Food in the Last Year: Never true  Transportation Needs: No Transportation Needs  . Lack of Transportation (Medical): No  . Lack of Transportation (Non-Medical): No  Physical Activity: Inactive  . Days of Exercise per Week: 0 days  . Minutes of Exercise per Session: 0 min  Stress: No Stress Concern Present  . Feeling of Stress : Not at all  Social Connections: Moderately Integrated  . Frequency of Communication with Friends and Family: More than three times a week  . Frequency of Social Gatherings with Friends and Family: Three times a week  . Attends Religious Services: 1 to 4 times per year  . Active Member of Clubs or Organizations: No  . Attends Archivist Meetings: Never  . Marital  Status: Married  Human resources officer Violence: Not At Risk  . Fear of Current or Ex-Partner: No  . Emotionally Abused: No  . Physically Abused: No  . Sexually Abused: No    Family History  Problem Relation Age of Onset  . Heart attack Mother        fatal, cause of death  . Heart attack Maternal Grandmother   . Memory loss Neg Hx     Anti-infectives: Anti-infectives (From admission, onward)   None      Current Outpatient Medications  Medication Sig Dispense Refill  . acetaminophen (TYLENOL) 500 MG tablet Take 1,000 mg by mouth every 6 (six) hours as needed for mild pain or moderate pain.     Marland Kitchen amLODipine (NORVASC) 10 MG tablet Take 10 mg by mouth daily.     Marland Kitchen atorvastatin (LIPITOR) 20 MG tablet Take 20 mg by mouth daily.    . B Complex Vitamins (VITAMIN B-COMPLEX) TABS Take 1 tablet by mouth daily.     . cyanocobalamin (,VITAMIN B-12,) 1000 MCG/ML injection every 30 (thirty) days.    . fluticasone (FLONASE) 50 MCG/ACT nasal spray Place 2 sprays into both nostrils at bedtime.     . gabapentin (NEURONTIN) 100 MG capsule Take 100-300 mg by mouth at bedtime as needed (for pain).     Marland Kitchen glipiZIDE (GLUCOTROL XL) 5 MG 24 hr tablet Take 5 mg by mouth 2 (two) times daily.    . Glucosamine-Chondroit-Vit C-Mn (GLUCOSAMINE 1500 COMPLEX PO) Take 1 tablet by mouth daily.     . Glucose Blood (GNP TRUE METRIX GLUCOSE STRIPS VI) USE THREE TIMES DAILY    . meclizine (ANTIVERT) 25 MG tablet Take 1 tablet (25 mg total) by mouth 3 (three) times daily as needed for dizziness. may cause drowsiness 21 tablet 0  . Melatonin 10 MG CAPS Take 10 mg by mouth at bedtime.     . metFORMIN (GLUCOPHAGE) 500 MG tablet Take 500 mg by mouth 4 (four) times daily.     . mupirocin ointment (BACTROBAN) 2 % Apply 1 application topically 3 times/day as needed-between meals & bedtime.    Marland Kitchen olmesartan (BENICAR) 40 MG tablet Take 40 mg by mouth daily.     . vitamin B-12 (CYANOCOBALAMIN) 1000 MCG tablet Take 1,000 mcg by mouth  daily.     No current facility-administered medications for this visit.     Objective: Vital signs in last 24 hours: BP (!) 196/93   Pulse 75   Temp 98.3 F (36.8 C)   Ht 5\' 11"  (1.803 m)   Wt 267 lb (121.1 kg)  BMI 37.24 kg/m   Intake/Output from previous day: No intake/output data recorded. Intake/Output this shift: @IOTHISSHIFT @   Physical Exam  Lab Results:  Results for orders placed or performed in visit on 01/29/21 (from the past 24 hour(s))  Urinalysis, Routine w reflex microscopic     Status: Abnormal   Collection Time: 01/29/21  2:57 PM  Result Value Ref Range   Specific Gravity, UA 1.020 1.005 - 1.030   pH, UA 6.0 5.0 - 7.5   Color, UA Yellow Yellow   Appearance Ur Clear Clear   Leukocytes,UA Negative Negative   Protein,UA 2+ (A) Negative/Trace   Glucose, UA Negative Negative   Ketones, UA Negative Negative   RBC, UA Trace (A) Negative   Bilirubin, UA Negative Negative   Urobilinogen, Ur 0.2 0.2 - 1.0 mg/dL   Nitrite, UA Negative Negative   Microscopic Examination See below:    Narrative   Performed at:  [facility] [address], [place], [place]  [id number] Lab Director: Mina Marble [place], Phone:  [phone]  Microscopic Examination     Status: None   Collection Time: 01/29/21  2:57 PM   Urine  Result Value Ref Range   WBC, UA None seen 0 - 5 /hpf   RBC 0-2 0 - 2 /hpf   Epithelial Cells (non renal) None seen 0 - 10 /hpf   Renal Epithel, UA None seen None seen /hpf   Mucus, UA Present Not Estab.   Bacteria, UA Few None seen/Few   Narrative   Performed at:  [facility] [address], [place], [place]  [id number] Lab Director: [place], Phone:  [phone]    BMET No results for input(s): NA, K, CL, CO2, GLUCOSE, BUN, CREATININE, CALCIUM in the last 72 hours. PT/INR No results for input(s): LABPROT, INR in the last 72 hours. ABG No results for input(s): PHART, HCO3 in the last 72 hours.  Invalid  input(s): PCO2, PO2  Studies/Results: No results found. Outside records reviewed.   Assessment/Plan: Elevated PSA. He is scheduled for a PSA prior to 6 month f/u.  BPH with BOO and OAB.  He didn't improve with Gemtesa and had unacceptable side effects.  I discussed consideration of other meds or neuromodulation but he wants to avoid further therapy.  No orders of the defined types were placed in this encounter.    Orders Placed This Encounter  Procedures  . Microscopic Examination  . Urinalysis, Routine w reflex microscopic     Return in about 6 months (around 07/31/2021) for with psa.    CC: Dr. Wende Neighbors.      Irine Seal 01/29/2021 [phone] ID: Brian [name], male   DOB: [birth year], 80 y.o.   MRN: [id number]

## 2021-01-29 NOTE — Progress Notes (Signed)

## 2021-02-02 ENCOUNTER — Ambulatory Visit: Payer: Medicare Other | Admitting: Psychology

## 2021-02-09 DIAGNOSIS — L237 Allergic contact dermatitis due to plants, except food: Secondary | ICD-10-CM | POA: Diagnosis not present

## 2021-02-13 DIAGNOSIS — D519 Vitamin B12 deficiency anemia, unspecified: Secondary | ICD-10-CM | POA: Diagnosis not present

## 2021-02-13 DIAGNOSIS — L237 Allergic contact dermatitis due to plants, except food: Secondary | ICD-10-CM | POA: Diagnosis not present

## 2021-02-17 DIAGNOSIS — L237 Allergic contact dermatitis due to plants, except food: Secondary | ICD-10-CM | POA: Diagnosis not present

## 2021-02-17 DIAGNOSIS — I10 Essential (primary) hypertension: Secondary | ICD-10-CM | POA: Diagnosis not present

## 2021-02-23 ENCOUNTER — Other Ambulatory Visit (HOSPITAL_COMMUNITY): Payer: Self-pay

## 2021-02-23 ENCOUNTER — Inpatient Hospital Stay (HOSPITAL_COMMUNITY): Payer: Medicare Other | Attending: Hematology

## 2021-02-23 ENCOUNTER — Other Ambulatory Visit: Payer: Self-pay

## 2021-02-23 DIAGNOSIS — D696 Thrombocytopenia, unspecified: Secondary | ICD-10-CM | POA: Insufficient documentation

## 2021-02-23 DIAGNOSIS — K760 Fatty (change of) liver, not elsewhere classified: Secondary | ICD-10-CM | POA: Insufficient documentation

## 2021-02-23 DIAGNOSIS — R066 Hiccough: Secondary | ICD-10-CM | POA: Diagnosis not present

## 2021-02-23 DIAGNOSIS — R5383 Other fatigue: Secondary | ICD-10-CM | POA: Diagnosis not present

## 2021-02-23 DIAGNOSIS — E669 Obesity, unspecified: Secondary | ICD-10-CM | POA: Insufficient documentation

## 2021-02-23 DIAGNOSIS — Z79899 Other long term (current) drug therapy: Secondary | ICD-10-CM | POA: Diagnosis not present

## 2021-02-23 DIAGNOSIS — D693 Immune thrombocytopenic purpura: Secondary | ICD-10-CM

## 2021-02-23 DIAGNOSIS — R42 Dizziness and giddiness: Secondary | ICD-10-CM | POA: Diagnosis not present

## 2021-02-23 DIAGNOSIS — Z888 Allergy status to other drugs, medicaments and biological substances status: Secondary | ICD-10-CM | POA: Insufficient documentation

## 2021-02-23 DIAGNOSIS — K30 Functional dyspepsia: Secondary | ICD-10-CM | POA: Diagnosis not present

## 2021-02-23 DIAGNOSIS — R21 Rash and other nonspecific skin eruption: Secondary | ICD-10-CM | POA: Diagnosis not present

## 2021-02-23 DIAGNOSIS — Z8249 Family history of ischemic heart disease and other diseases of the circulatory system: Secondary | ICD-10-CM | POA: Insufficient documentation

## 2021-02-23 DIAGNOSIS — Z880 Allergy status to penicillin: Secondary | ICD-10-CM | POA: Insufficient documentation

## 2021-02-23 LAB — CBC WITH DIFFERENTIAL/PLATELET
Abs Immature Granulocytes: 0.04 10*3/uL (ref 0.00–0.07)
Basophils Absolute: 0 10*3/uL (ref 0.0–0.1)
Basophils Relative: 1 %
Eosinophils Absolute: 0.3 10*3/uL (ref 0.0–0.5)
Eosinophils Relative: 5 %
HCT: 40.3 % (ref 39.0–52.0)
Hemoglobin: 13.8 g/dL (ref 13.0–17.0)
Immature Granulocytes: 1 %
Lymphocytes Relative: 21 %
Lymphs Abs: 1.4 10*3/uL (ref 0.7–4.0)
MCH: 32.1 pg (ref 26.0–34.0)
MCHC: 34.2 g/dL (ref 30.0–36.0)
MCV: 93.7 fL (ref 80.0–100.0)
Monocytes Absolute: 0.6 10*3/uL (ref 0.1–1.0)
Monocytes Relative: 9 %
Neutro Abs: 4.5 10*3/uL (ref 1.7–7.7)
Neutrophils Relative %: 63 %
Platelets: 45 10*3/uL — ABNORMAL LOW (ref 150–400)
RBC: 4.3 MIL/uL (ref 4.22–5.81)
RDW: 12.7 % (ref 11.5–15.5)
WBC: 6.9 10*3/uL (ref 4.0–10.5)
nRBC: 0 % (ref 0.0–0.2)

## 2021-03-02 ENCOUNTER — Other Ambulatory Visit: Payer: Self-pay

## 2021-03-02 ENCOUNTER — Inpatient Hospital Stay (HOSPITAL_BASED_OUTPATIENT_CLINIC_OR_DEPARTMENT_OTHER): Payer: Medicare Other | Admitting: Hematology

## 2021-03-02 VITALS — BP 158/91 | HR 76 | Temp 98.1°F | Resp 18 | Wt 265.0 lb

## 2021-03-02 DIAGNOSIS — E669 Obesity, unspecified: Secondary | ICD-10-CM | POA: Diagnosis not present

## 2021-03-02 DIAGNOSIS — R066 Hiccough: Secondary | ICD-10-CM | POA: Diagnosis not present

## 2021-03-02 DIAGNOSIS — R21 Rash and other nonspecific skin eruption: Secondary | ICD-10-CM | POA: Diagnosis not present

## 2021-03-02 DIAGNOSIS — R5383 Other fatigue: Secondary | ICD-10-CM | POA: Diagnosis not present

## 2021-03-02 DIAGNOSIS — K30 Functional dyspepsia: Secondary | ICD-10-CM | POA: Diagnosis not present

## 2021-03-02 DIAGNOSIS — D696 Thrombocytopenia, unspecified: Secondary | ICD-10-CM | POA: Diagnosis not present

## 2021-03-02 DIAGNOSIS — D693 Immune thrombocytopenic purpura: Secondary | ICD-10-CM | POA: Diagnosis not present

## 2021-03-02 NOTE — Patient Instructions (Signed)
[facility] °Discharge Instructions ° °You were seen today by Dr. Katragadda. He went over your recent results. Dr. Katragadda will see you back in 4 months for labs and follow up. ° ° °Thank you for choosing [facility] to provide your oncology and hematology care.  To afford each patient quality time with our provider, please arrive at least 15 minutes before your scheduled appointment time.  ° °If you have a lab appointment with the Cancer Center please come in thru the Main Entrance and check in at the main information desk ° °You need to re-schedule your appointment should you arrive 10 or more minutes late.  We strive to give you quality time with our providers, and arriving late affects you and other patients whose appointments are after yours.  Also, if you no show three or more times for appointments you may be dismissed from the clinic at the providers discretion.     °Again, thank you for choosing [facility].  Our hope is that these requests will decrease the amount of time that you wait before being seen by our physicians.       °_____________________________________________________________ ° °Should you have questions after your visit to  Cancer Center, please contact our office at [phone] between the hours of 8:00 a.m. and 4:30 p.m.  Voicemails left after 4:00 p.m. will not be returned until the following business day.  For prescription refill requests, have your pharmacy contact our office and allow 72 hours.   ° °Cancer Center Support Programs:  ° °> Cancer Support Group  °2nd Tuesday of the month 1pm-2pm, Journey Room  ° ° °

## 2021-03-02 NOTE — Progress Notes (Signed)
[place] Brian [name], [place] [address]   CLINIC:  Medical Oncology/Hematology  PCP:  Celene Squibb, MD [address] [name] [address]  [phone]  REASON FOR VISIT:  Follow-up for immune-mediated thrombocytopenia  PRIOR THERAPY: none  CURRENT THERAPY: observation  INTERVAL HISTORY:  Brian [name], a 80 y.o. 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 Brian [name] was last seen on 11/24/2020.  Today he reports feeling okay. He reports having poison ivy rash on his skin 5 weeks ago and it was treated with a medrol which helped. He sees blood when he blows his nose. He denies easy bruising or hematuria.   REVIEW OF SYSTEMS:  Review of Systems  Constitutional: Positive for fatigue (50%). Negative for appetite change.  HENT:   Positive for nosebleeds (specs of blood when blowing nose).   Skin: Positive for rash (poison ivy 5 weeks ago).  Neurological: Positive for dizziness.  All other systems reviewed and are negative.   PAST MEDICAL/SURGICAL HISTORY:  Past Medical History:  Diagnosis Date  . Arthritis   . Atypical chest pain    Low-risk exercise stress Cardiolite EF 60% April 2008. Reportedly normal remote cardiac catheterization @ [facility] EF 60%...nuclear....2008  . Bradycardia    hospital 03/2010 stopped beta blocker  . Complication of anesthesia   . Diabetes mellitus without complication ([place])   . Dyslipidemia   . Hypertension   . Mild aortic regurgitation with LV dilation by prior echocardiogram   . Neuropathy    in feet  . Obesity   . Obstructive sleep apnea    C-Pap compliant  . PONV (postoperative nausea and vomiting)   . Thrombocytopenia (HCC)    mild follow Dr. Alonna Minium  . Vertigo    Hospital 03/2010   Past Surgical History:  Procedure Laterality Date  . CARDIAC CATHETERIZATION    . KNEE ARTHROSCOPY WITH MEDIAL MENISECTOMY Left 05/31/2014   Procedure: LEFT KNEE ARTHROSCOPY WITH MEDIAL  MENISECTOMY;  Surgeon: Carole Civil, MD;  Location: [facility];  Service: Orthopedics;  Laterality: Left;  . Status post bilateral arthroscopic knee surgery    . 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 Marital status: Married    Spouse name: Not on file  . Number of children: 2  . Years of education: Not on file  . Highest education level: Some college, no degree  Occupational History  . Occupation: retired  Tobacco Use  . Smoking status: Never Smoker  . Smokeless tobacco: Never Used  Vaping Use  . Vaping Use: Never used  Substance and Sexual Activity  . Alcohol use: Not Currently    Comment: wine on occasion  . Drug use: No  . Sexual activity: Not Currently  Other Topics Concern  . Not on file  Social History Narrative   Lives at home with wife   Right handed   Caffeine: Diet coke roughly 12 oz in a day    Social Determinants of Health   Financial Resource Strain: Low Risk   . Difficulty of Paying Living Expenses: Not hard at all  Food Insecurity: No Food Insecurity  . Worried About Charity fundraiser in the Last Year: Never true  . Ran Out of Food in the Last Year:  Never true  Transportation Needs: No Transportation Needs  . Lack of Transportation (Medical): No  . Lack of Transportation (Non-Medical): No  Physical Activity: Inactive  . Days of Exercise per Week: 0 days  . Minutes of Exercise per Session: 0 min  Stress: No Stress Concern Present  . Feeling of Stress : Not at all  Social Connections: Moderately Integrated  . Frequency of Communication with Friends and Family: More than three times a week  . Frequency of Social Gatherings with Friends and Family: Three times a week  . Attends Religious Services: 1 to 4 times per year  . Active Member of Clubs or  Organizations: No  . Attends Archivist Meetings: Never  . Marital Status: Married  Human resources officer Violence: Not At Risk  . Fear of Current or Ex-Partner: No  . Emotionally Abused: No  . Physically Abused: No  . Sexually Abused: No    FAMILY HISTORY:  Family History  Problem Relation Age of Onset  . Heart attack Mother        fatal, cause of death  . Heart attack Maternal Grandmother   . Memory loss Neg Hx     CURRENT MEDICATIONS:  Current Outpatient Medications  Medication Sig Dispense Refill  . acetaminophen (TYLENOL) 500 MG tablet Take 1,000 mg by mouth every 6 (six) hours as needed for mild pain or moderate pain.     Marland Kitchen amLODipine (NORVASC) 10 MG tablet Take 10 mg by mouth daily.     Marland Kitchen atorvastatin (LIPITOR) 20 MG tablet Take 20 mg by mouth daily.    . B Complex Vitamins (VITAMIN B-COMPLEX) TABS Take 1 tablet by mouth daily.     . cyanocobalamin (,VITAMIN B-12,) 1000 MCG/ML injection every 30 (thirty) days.    . fluticasone (FLONASE) 50 MCG/ACT nasal spray Place 2 sprays into both nostrils at bedtime.     . gabapentin (NEURONTIN) 100 MG capsule Take 100-300 mg by mouth at bedtime as needed (for pain).     Marland Kitchen glipiZIDE (GLUCOTROL XL) 5 MG 24 hr tablet Take 5 mg by mouth 2 (two) times daily.    . Glucosamine-Chondroit-Vit C-Mn (GLUCOSAMINE 1500 COMPLEX PO) Take 1 tablet by mouth daily.     . Glucose Blood (GNP TRUE METRIX GLUCOSE STRIPS VI) USE THREE TIMES DAILY    . meclizine (ANTIVERT) 25 MG tablet Take 1 tablet (25 mg total) by mouth 3 (three) times daily as needed for dizziness. may cause drowsiness 21 tablet 0  . Melatonin 10 MG CAPS Take 10 mg by mouth at bedtime.     . metFORMIN (GLUCOPHAGE) 500 MG tablet Take 500 mg by mouth 4 (four) times daily.     . mupirocin ointment (BACTROBAN) 2 % Apply 1 application topically 3 times/day as needed-between meals & bedtime.    Marland Kitchen olmesartan (BENICAR) 40 MG tablet Take 40 mg by mouth daily.     . vitamin B-12  (CYANOCOBALAMIN) 1000 MCG tablet Take 1,000 mcg by mouth daily.     No current facility-administered medications for this visit.    ALLERGIES:  Allergies  Allergen Reactions  . Contrast Media [Iodinated Diagnostic Agents] Other (See Comments)    Allergic to MRI contrast. Eyes swelling very badly   . Penicillin G   . Penicillins Other (See Comments)    dizziness  . Gemtesa [Vibegron] Rash    Patient reports red spots on his arms and itching    PHYSICAL EXAM:  Performance status (ECOG): 1 - Symptomatic but  completely ambulatory  Vitals:   03/02/21 1528 03/02/21 1530  BP: (!) 165/93 (!) 158/91  Pulse: 76   Resp: 18   Temp: 98.1 F (36.7 C)   SpO2: 96%    Wt Readings from Last 3 Encounters:  03/02/21 265 lb (120.2 kg)  01/29/21 267 lb (121.1 kg)  12/25/20 263 lb (119.3 kg)   Physical Exam Vitals reviewed.  Constitutional:      Appearance: Normal appearance. He is obese.  Neurological:     General: No focal deficit present.     Mental Status: He is alert and oriented to person, place, and time.  Psychiatric:        Mood and Affect: Mood normal.        Behavior: Behavior normal.     LABORATORY DATA:  I have reviewed the labs as listed.  CBC Latest Ref Rng & Units 02/23/2021 11/20/2020 09/26/2020  WBC 4.0 - 10.5 K/uL 6.9 7.8 7.5  Hemoglobin 13.0 - 17.0 g/dL 13.8 14.6 14.0  Hematocrit 39.0 - 52.0 % 40.3 42.9 40.5  Platelets 150 - 400 K/uL 45(L) 46(L) 30(L)   CMP Latest Ref Rng & Units 10/07/2020 05/08/2020 02/22/2020  Glucose 65 - 99 mg/dL 223(H) 155(H) 170(H)  BUN 8 - 27 mg/dL 17 19 30(H)  Creatinine 0.76 - 1.27 mg/dL 0.99 0.90 1.14  Sodium 134 - 144 mmol/L 137 137 134(L)  Potassium 3.5 - 5.2 mmol/L 3.5 3.8 3.8  Chloride 96 - 106 mmol/L 101 106 104  CO2 20 - 29 mmol/L 20 22 21(L)  Calcium 8.6 - 10.2 mg/dL 9.4 9.1 9.3  Total Protein 6.5 - 8.1 g/dL - 7.1 6.6  Total Bilirubin 0.3 - 1.2 mg/dL - 0.6 0.7  Alkaline Phos 38 - 126 U/L - 53 50  AST 15 - 41 U/L - 17 19   ALT 0 - 44 U/L - 18 18      Component Value Date/Time   RBC 4.30 02/23/2021 1050   MCV 93.7 02/23/2021 1050   MCH 32.1 02/23/2021 1050   MCHC 34.2 02/23/2021 1050   RDW 12.7 02/23/2021 1050   LYMPHSABS 1.4 02/23/2021 1050   MONOABS 0.6 02/23/2021 1050   EOSABS 0.3 02/23/2021 1050   BASOSABS 0.0 02/23/2021 1050    DIAGNOSTIC IMAGING:  I have independently reviewed the scans and discussed with the patient. No results found.   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 Bone marrow biopsy in 2009 was reportedly normal. This was done in [place]. -CTAP showed fatty infiltration of the liver with normal spleen. -Work-up for nutritional deficiencies, plasma cell disorder was negative. Infectious etiology was negative. LDH was normal. -Platelet count dropped to 29 on 01/26/2020 and was treated with dexamethasone for 4 days. -Dexamethasone made to him have epigastric pain, indigestion, burping and hiccups. -Peak platelet count reached 108 on 01/31/2020. -As he cannot tolerate dexamethasone, options include Promacta.  PLAN:  1. Immune mediated thrombocytopenia: -Denies any nosebleeds, gum bleeds or bleeding per rectum. - He reportedly had poison ivy rash for which he was treated with Medrol Dosepak about 5 weeks ago. - Physical examination did not reveal any suspicious findings. - Reviewed labs from 02/23/2021 which showed stable platelet count 45.  White count and hemoglobin was normal. - No indication for treatment.  RTC 4 months with repeat labs.  Orders placed this encounter:  Orders Placed This Encounter  Procedures  . CBC with Differential/Platelet  . Comprehensive metabolic panel  . Lactate  dehydrogenase     Derek Jack, MD [place] [phone]   I, Milinda Antis, am acting as a scribe for Dr. Sanda Linger.  I, Derek Jack MD, have reviewed the above documentation  for accuracy and completeness, and I agree with the above.

## 2021-03-05 IMAGING — DX DG CHEST 1V
1 series · 1 of 1 positions shown · non-contrast
Comparison: None.

CLINICAL DATA: Dizziness

EXAM:
CHEST  1 VIEW

[chest ap]
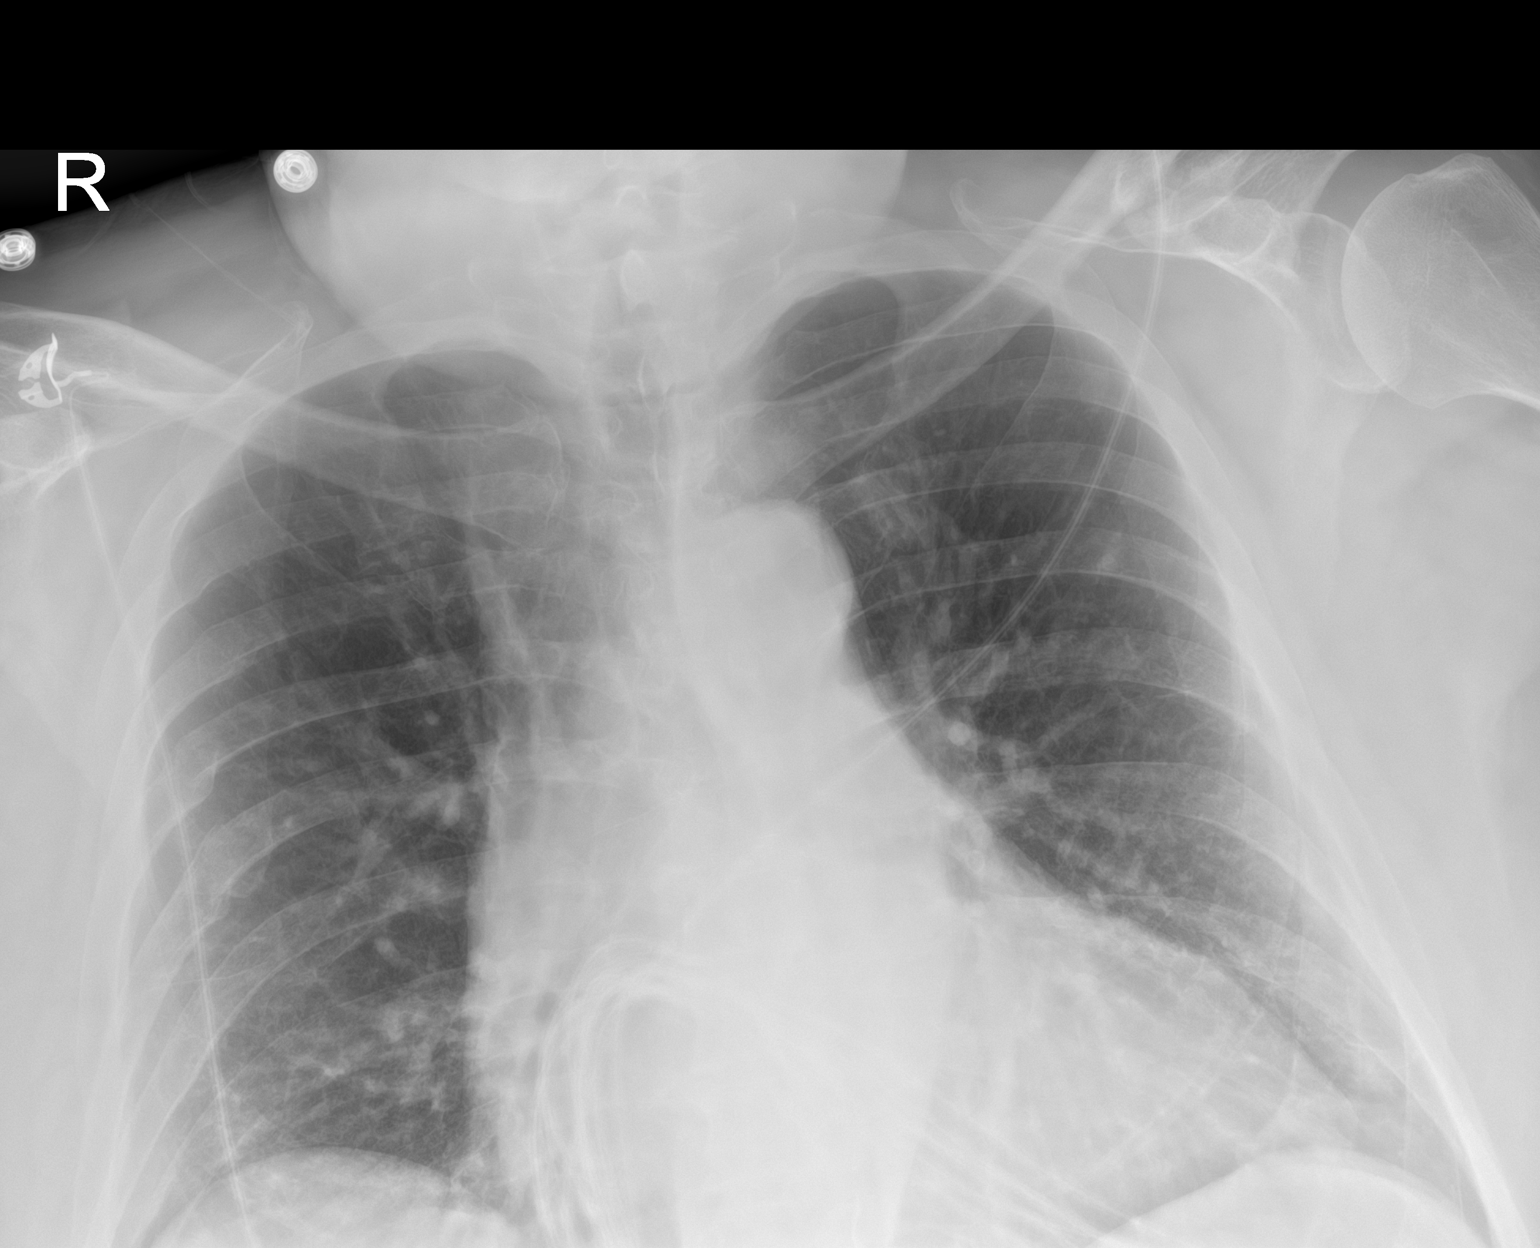

[1 of 1 positions shown; findings below may reference images not displayed]

FINDINGS: A small portion of the lateral right base is not included on this
study. There is mild apparent scarring in the left base. Visualized
lungs elsewhere clear. Heart is mildly enlarged with pulmonary
vascularity normal. No adenopathy. No evident pneumothorax. There
old healed rib fractures on the right.
IMPRESSION: Note that a portion of the inferolateral right base is not included
on this study. Slight scarring left base. Lungs elsewhere clear.
There is cardiomegaly. No adenopathy evident.

## 2021-03-13 DIAGNOSIS — Z23 Encounter for immunization: Secondary | ICD-10-CM | POA: Diagnosis not present

## 2021-03-16 DIAGNOSIS — E538 Deficiency of other specified B group vitamins: Secondary | ICD-10-CM | POA: Diagnosis not present

## 2021-03-16 DIAGNOSIS — G629 Polyneuropathy, unspecified: Secondary | ICD-10-CM | POA: Insufficient documentation

## 2021-03-16 DIAGNOSIS — E119 Type 2 diabetes mellitus without complications: Secondary | ICD-10-CM | POA: Insufficient documentation

## 2021-04-15 ENCOUNTER — Ambulatory Visit: Payer: Medicare Other | Admitting: Orthopedic Surgery

## 2021-04-17 DIAGNOSIS — E538 Deficiency of other specified B group vitamins: Secondary | ICD-10-CM | POA: Diagnosis not present

## 2021-04-17 DIAGNOSIS — D519 Vitamin B12 deficiency anemia, unspecified: Secondary | ICD-10-CM | POA: Diagnosis not present

## 2021-04-23 DIAGNOSIS — I10 Essential (primary) hypertension: Secondary | ICD-10-CM | POA: Diagnosis not present

## 2021-04-23 DIAGNOSIS — E1165 Type 2 diabetes mellitus with hyperglycemia: Secondary | ICD-10-CM | POA: Diagnosis not present

## 2021-04-30 ENCOUNTER — Ambulatory Visit (INDEPENDENT_AMBULATORY_CARE_PROVIDER_SITE_OTHER): Payer: Medicare Other | Admitting: Orthopedic Surgery

## 2021-04-30 ENCOUNTER — Encounter: Payer: Self-pay | Admitting: Orthopedic Surgery

## 2021-04-30 ENCOUNTER — Other Ambulatory Visit: Payer: Self-pay

## 2021-04-30 ENCOUNTER — Ambulatory Visit: Payer: Medicare Other

## 2021-04-30 VITALS — BP 142/95 | HR 81 | Ht 71.0 in | Wt 263.0 lb

## 2021-04-30 DIAGNOSIS — Z96652 Presence of left artificial knee joint: Secondary | ICD-10-CM | POA: Diagnosis not present

## 2021-04-30 DIAGNOSIS — M1712 Unilateral primary osteoarthritis, left knee: Secondary | ICD-10-CM

## 2021-04-30 DIAGNOSIS — I6381 Other cerebral infarction due to occlusion or stenosis of small artery: Secondary | ICD-10-CM

## 2021-04-30 NOTE — Progress Notes (Signed)
FOLLOW UP   Encounter Diagnoses  Name Primary?   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

## 2021-05-04 DIAGNOSIS — E782 Mixed hyperlipidemia: Secondary | ICD-10-CM | POA: Diagnosis not present

## 2021-05-04 DIAGNOSIS — I10 Essential (primary) hypertension: Secondary | ICD-10-CM | POA: Diagnosis not present

## 2021-05-04 DIAGNOSIS — E1129 Type 2 diabetes mellitus with other diabetic kidney complication: Secondary | ICD-10-CM | POA: Diagnosis not present

## 2021-05-06 DIAGNOSIS — H524 Presbyopia: Secondary | ICD-10-CM | POA: Diagnosis not present

## 2021-05-06 DIAGNOSIS — H43813 Vitreous degeneration, bilateral: Secondary | ICD-10-CM | POA: Diagnosis not present

## 2021-05-06 DIAGNOSIS — E113291 Type 2 diabetes mellitus with mild nonproliferative diabetic retinopathy without macular edema, right eye: Secondary | ICD-10-CM | POA: Diagnosis not present

## 2021-05-06 DIAGNOSIS — H2513 Age-related nuclear cataract, bilateral: Secondary | ICD-10-CM | POA: Diagnosis not present

## 2021-05-07 DIAGNOSIS — D696 Thrombocytopenia, unspecified: Secondary | ICD-10-CM | POA: Diagnosis not present

## 2021-05-07 DIAGNOSIS — I129 Hypertensive chronic kidney disease with stage 1 through stage 4 chronic kidney disease, or unspecified chronic kidney disease: Secondary | ICD-10-CM | POA: Insufficient documentation

## 2021-05-07 DIAGNOSIS — R972 Elevated prostate specific antigen [PSA]: Secondary | ICD-10-CM | POA: Insufficient documentation

## 2021-05-07 DIAGNOSIS — L0292 Furuncle, unspecified: Secondary | ICD-10-CM | POA: Diagnosis not present

## 2021-05-07 DIAGNOSIS — R270 Ataxia, unspecified: Secondary | ICD-10-CM | POA: Insufficient documentation

## 2021-05-07 DIAGNOSIS — L853 Xerosis cutis: Secondary | ICD-10-CM | POA: Insufficient documentation

## 2021-05-07 DIAGNOSIS — E1142 Type 2 diabetes mellitus with diabetic polyneuropathy: Secondary | ICD-10-CM | POA: Insufficient documentation

## 2021-05-07 DIAGNOSIS — R809 Proteinuria, unspecified: Secondary | ICD-10-CM | POA: Insufficient documentation

## 2021-05-07 DIAGNOSIS — E782 Mixed hyperlipidemia: Secondary | ICD-10-CM | POA: Diagnosis not present

## 2021-05-07 DIAGNOSIS — Z8639 Personal history of other endocrine, nutritional and metabolic disease: Secondary | ICD-10-CM | POA: Insufficient documentation

## 2021-05-07 DIAGNOSIS — R42 Dizziness and giddiness: Secondary | ICD-10-CM | POA: Diagnosis not present

## 2021-05-07 DIAGNOSIS — G4733 Obstructive sleep apnea (adult) (pediatric): Secondary | ICD-10-CM | POA: Diagnosis not present

## 2021-05-07 DIAGNOSIS — G9009 Other idiopathic peripheral autonomic neuropathy: Secondary | ICD-10-CM | POA: Insufficient documentation

## 2021-05-07 DIAGNOSIS — E1140 Type 2 diabetes mellitus with diabetic neuropathy, unspecified: Secondary | ICD-10-CM | POA: Diagnosis not present

## 2021-05-14 DIAGNOSIS — H25011 Cortical age-related cataract, right eye: Secondary | ICD-10-CM | POA: Diagnosis not present

## 2021-05-14 DIAGNOSIS — H25811 Combined forms of age-related cataract, right eye: Secondary | ICD-10-CM | POA: Diagnosis not present

## 2021-05-14 DIAGNOSIS — H2511 Age-related nuclear cataract, right eye: Secondary | ICD-10-CM | POA: Diagnosis not present

## 2021-05-18 IMAGING — CT CT CERVICAL SPINE W/O CM
4 series · 16 of 33 positions shown, 18 images · non-contrast
Comparison: None.

CLINICAL DATA: Fall with facial injury, right orbital fracture.

EXAM:
CT CERVICAL SPINE WITHOUT CONTRAST
TECHNIQUE: Multidetector CT imaging of the cervical spine was performed without
intravenous contrast. Multiplanar CT image reconstructions were also
generated.

[Series 4: c spine soft · axial · 0.35mm/px · z∈[+1490,+1578]mm · 4 of 104 slices shown]
[im 15/104  soft-tissue]
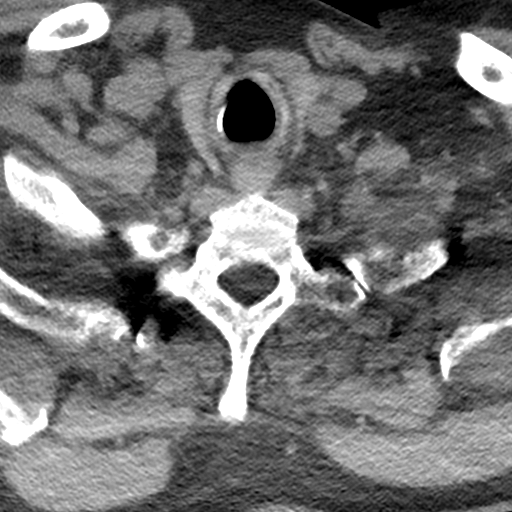
[im 30/104  soft-tissue]
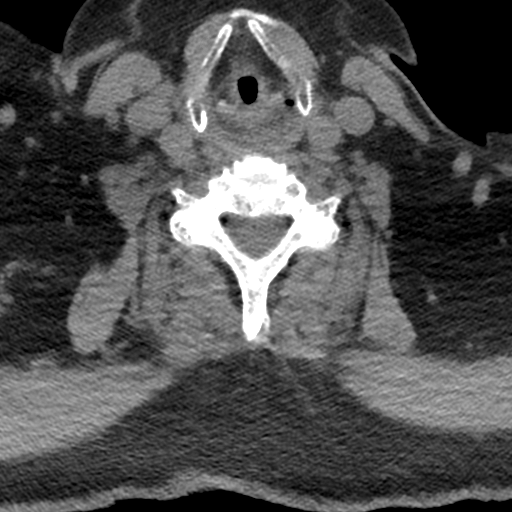
[im 45/104  soft-tissue]
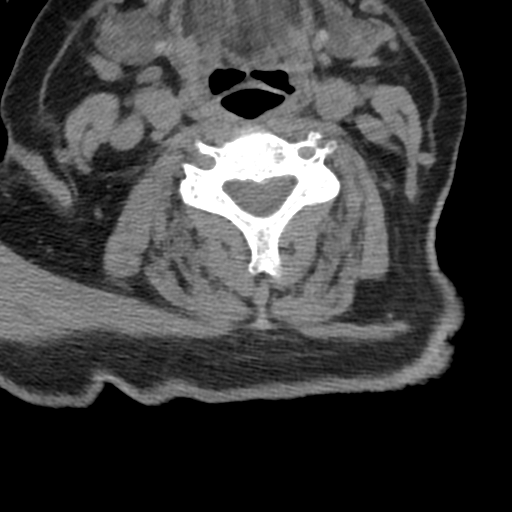
[im 59/104  soft-tissue]
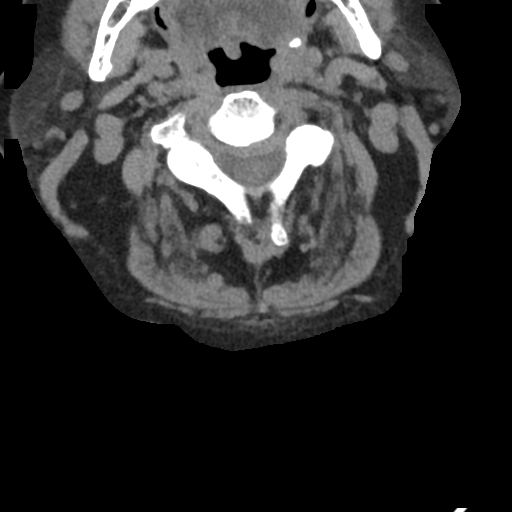

[Series 5: sag bone · sagittal · 0.36mm/px · 5 of 94 slices shown, 6 images]
[im 32/94  bone]
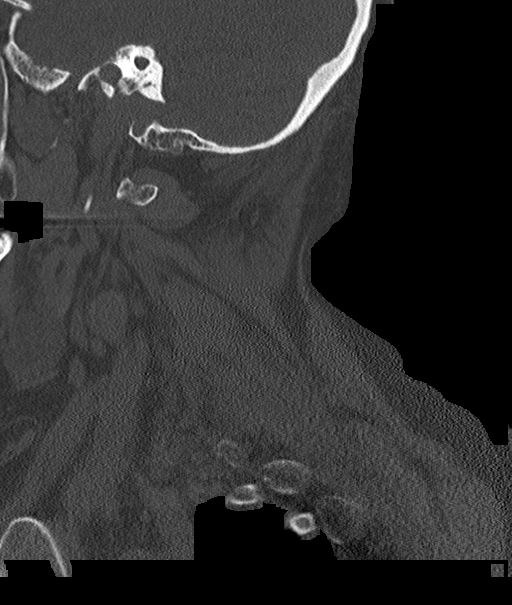
[im 39/94  bone]
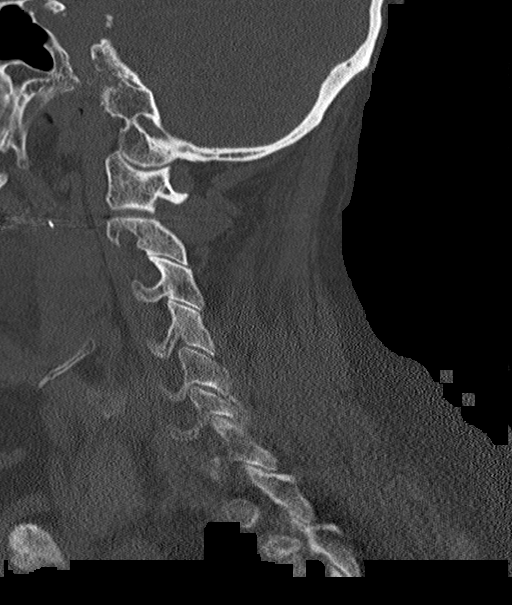
[im 47/94  soft-tissue]
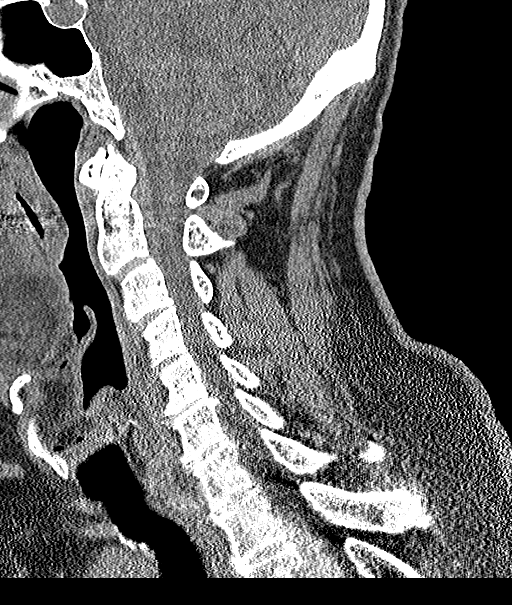
[im 47/94  bone]
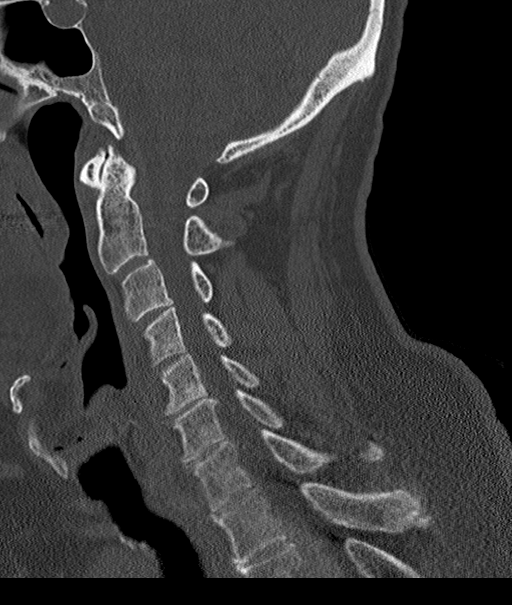
[im 55/94  bone]
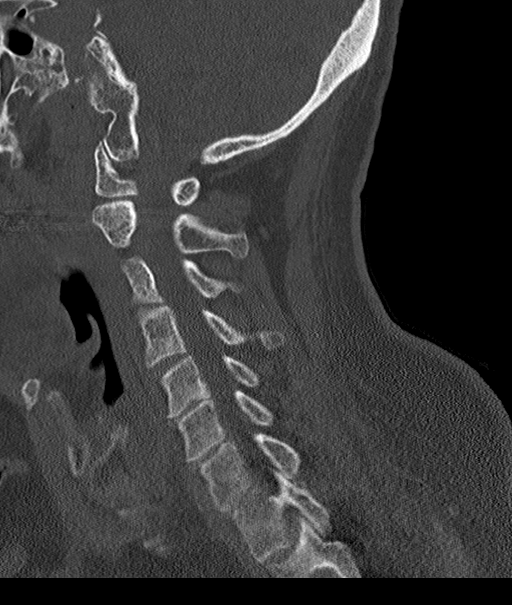
[im 63/94  bone]
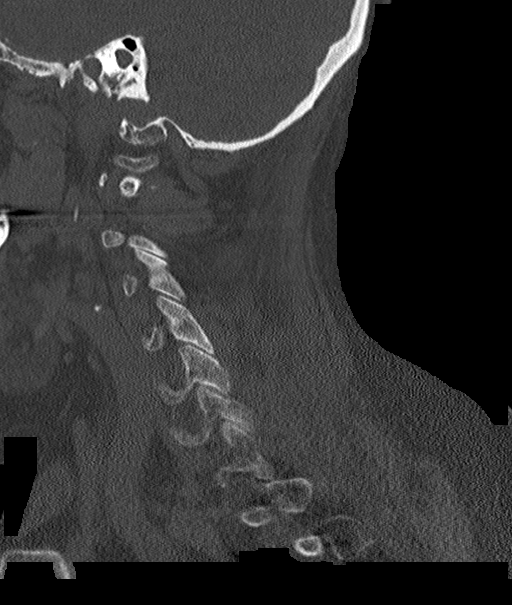

[Series 6: cor bone · coronal · 0.35mm/px · 3 of 77 slices shown]
[im 18/77  bone]
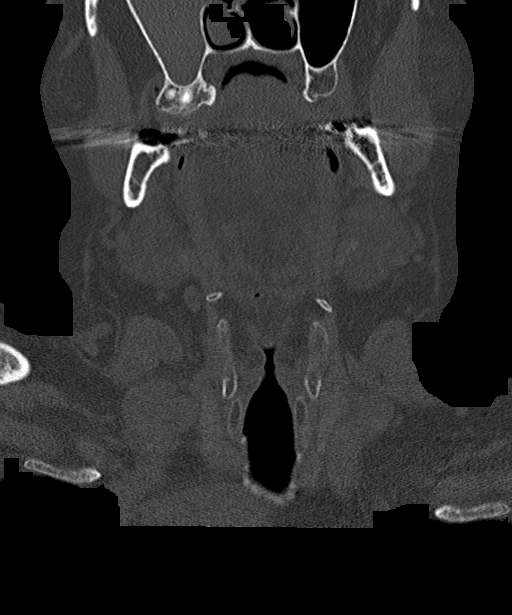
[im 32/77  bone]
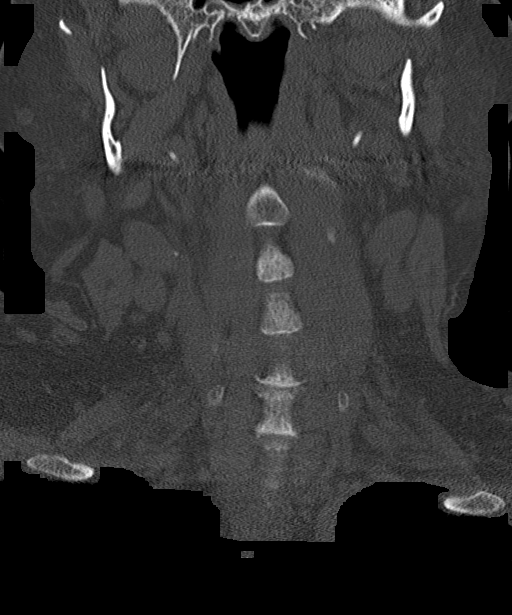
[im 45/77  bone]
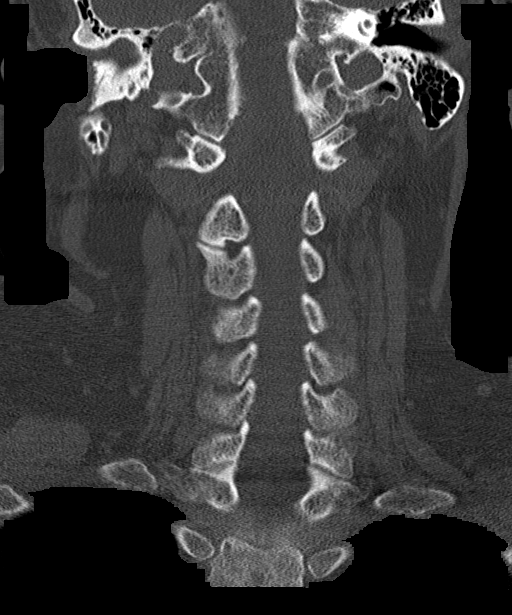

[Series 7: orthogonal axials · axial · 0.21mm/px · z∈[+1486,+1561]mm · 4 of 87 slices shown, 5 images]
[im 18/87  soft-tissue]
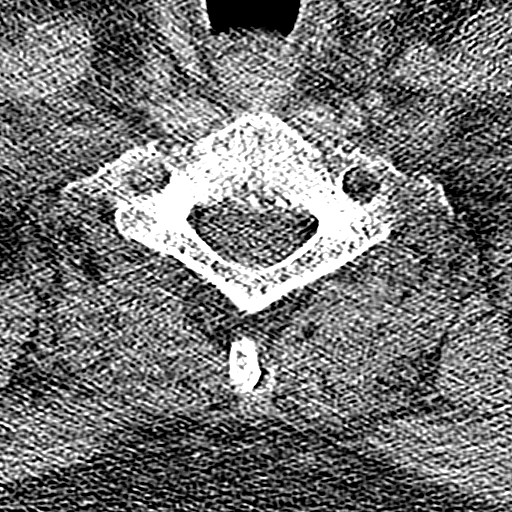
[im 18/87  bone]
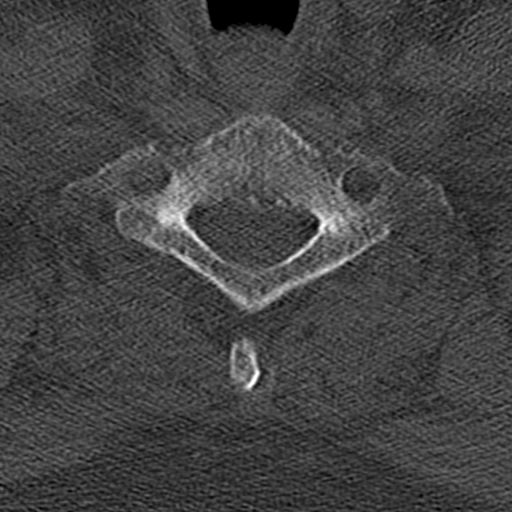
[im 35/87  bone]
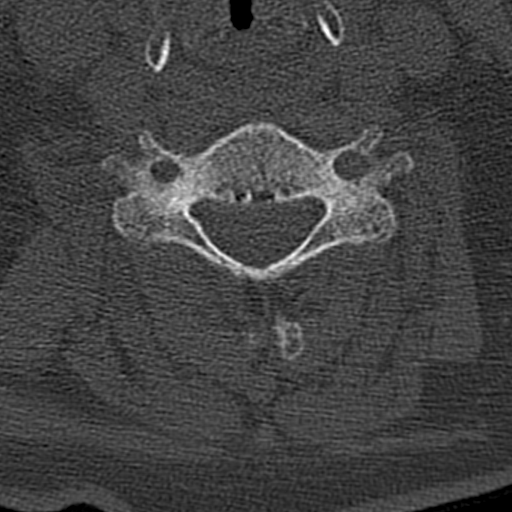
[im 52/87  bone]
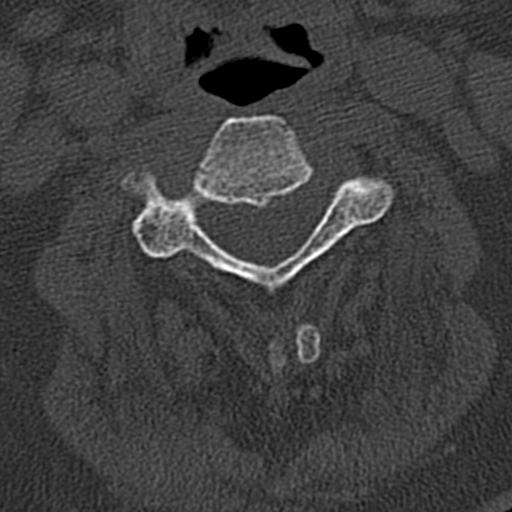
[im 69/87  bone]
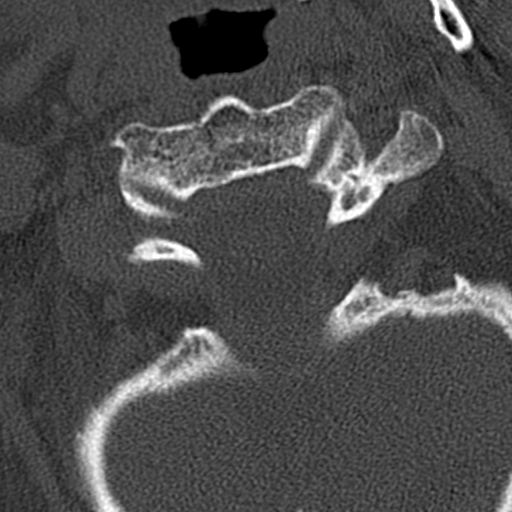

[16 of 33 positions shown; findings below may reference images not displayed]

FINDINGS: Alignment: No vertebral subluxation is observed.

Skull base and vertebrae: No fracture or acute bony findings.

Soft tissues and spinal canal: Mild bilateral carotid bulb
atherosclerotic calcification.

Disc levels: Mild bilateral osseous foraminal impingement at C5-6
due to uncinate spurring.

Upper chest: Mild scarring in the apical segment right upper lobe.

Other: No supplemental non-categorized findings.
IMPRESSION: 1. No cervical spine fracture or acute subluxation.
2. Mild bilateral osseous foraminal impingement at C5-6 due to
uncinate spurring.
3. Mild bilateral carotid bulb atherosclerotic calcification.
4. Mild scarring in the apical segment right upper lobe.

## 2021-06-04 DIAGNOSIS — H2512 Age-related nuclear cataract, left eye: Secondary | ICD-10-CM | POA: Diagnosis not present

## 2021-06-04 DIAGNOSIS — H25812 Combined forms of age-related cataract, left eye: Secondary | ICD-10-CM | POA: Diagnosis not present

## 2021-06-04 DIAGNOSIS — H25012 Cortical age-related cataract, left eye: Secondary | ICD-10-CM | POA: Diagnosis not present

## 2021-06-24 DIAGNOSIS — E1165 Type 2 diabetes mellitus with hyperglycemia: Secondary | ICD-10-CM | POA: Diagnosis not present

## 2021-06-24 DIAGNOSIS — I10 Essential (primary) hypertension: Secondary | ICD-10-CM | POA: Diagnosis not present

## 2021-07-01 ENCOUNTER — Other Ambulatory Visit (HOSPITAL_COMMUNITY): Payer: Medicare Other

## 2021-07-02 ENCOUNTER — Inpatient Hospital Stay (HOSPITAL_COMMUNITY): Payer: Medicare Other | Attending: Hematology

## 2021-07-02 ENCOUNTER — Other Ambulatory Visit: Payer: Self-pay

## 2021-07-02 DIAGNOSIS — E785 Hyperlipidemia, unspecified: Secondary | ICD-10-CM | POA: Insufficient documentation

## 2021-07-02 DIAGNOSIS — Z79899 Other long term (current) drug therapy: Secondary | ICD-10-CM | POA: Insufficient documentation

## 2021-07-02 DIAGNOSIS — E119 Type 2 diabetes mellitus without complications: Secondary | ICD-10-CM | POA: Diagnosis not present

## 2021-07-02 DIAGNOSIS — I10 Essential (primary) hypertension: Secondary | ICD-10-CM | POA: Diagnosis not present

## 2021-07-02 DIAGNOSIS — D696 Thrombocytopenia, unspecified: Secondary | ICD-10-CM | POA: Diagnosis not present

## 2021-07-02 DIAGNOSIS — Z7984 Long term (current) use of oral hypoglycemic drugs: Secondary | ICD-10-CM | POA: Diagnosis not present

## 2021-07-02 DIAGNOSIS — D693 Immune thrombocytopenic purpura: Secondary | ICD-10-CM

## 2021-07-02 LAB — CBC WITH DIFFERENTIAL/PLATELET
Abs Immature Granulocytes: 0.02 10*3/uL (ref 0.00–0.07)
Basophils Absolute: 0 10*3/uL (ref 0.0–0.1)
Basophils Relative: 1 %
Eosinophils Absolute: 0.3 10*3/uL (ref 0.0–0.5)
Eosinophils Relative: 5 %
HCT: 41.5 % (ref 39.0–52.0)
Hemoglobin: 14.7 g/dL (ref 13.0–17.0)
Immature Granulocytes: 0 %
Lymphocytes Relative: 27 %
Lymphs Abs: 1.6 10*3/uL (ref 0.7–4.0)
MCH: 32.3 pg (ref 26.0–34.0)
MCHC: 35.4 g/dL (ref 30.0–36.0)
MCV: 91.2 fL (ref 80.0–100.0)
Monocytes Absolute: 0.5 10*3/uL (ref 0.1–1.0)
Monocytes Relative: 8 %
Neutro Abs: 3.4 10*3/uL (ref 1.7–7.7)
Neutrophils Relative %: 59 %
Platelets: 61 10*3/uL — ABNORMAL LOW (ref 150–400)
RBC: 4.55 MIL/uL (ref 4.22–5.81)
RDW: 12.4 % (ref 11.5–15.5)
WBC: 5.8 10*3/uL (ref 4.0–10.5)
nRBC: 0 % (ref 0.0–0.2)

## 2021-07-02 LAB — COMPREHENSIVE METABOLIC PANEL
ALT: 23 U/L (ref 0–44)
AST: 24 U/L (ref 15–41)
Albumin: 4.2 g/dL (ref 3.5–5.0)
Alkaline Phosphatase: 57 U/L (ref 38–126)
Anion gap: 8 (ref 5–15)
BUN: 20 mg/dL (ref 8–23)
CO2: 22 mmol/L (ref 22–32)
Calcium: 8.6 mg/dL — ABNORMAL LOW (ref 8.9–10.3)
Chloride: 104 mmol/L (ref 98–111)
Creatinine, Ser: 0.97 mg/dL (ref 0.61–1.24)
GFR, Estimated: >60 mL/min (ref >60)
Glucose, Bld: 191 mg/dL — ABNORMAL HIGH (ref 70–99)
Potassium: 3.6 mmol/L (ref 3.5–5.1)
Sodium: 134 mmol/L — ABNORMAL LOW (ref 135–145)
Total Bilirubin: 0.6 mg/dL (ref 0.3–1.2)
Total Protein: 7.3 g/dL (ref 6.5–8.1)

## 2021-07-02 LAB — LACTATE DEHYDROGENASE: LDH: 147 U/L (ref 98–192)

## 2021-07-07 NOTE — Progress Notes (Signed)
Brian [name], [place] [id number]   CLINIC:  Medical Oncology/Hematology  PCP:  Brian Squibb, MD [place] [address] [phone]   REASON FOR VISIT:  Follow-up for immune mediated thrombocytopenia  PRIOR THERAPY: None  CURRENT THERAPY: Observation  INTERVAL HISTORY:  Brian [name] 80 y.o. 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  He was last seen by Brian [name] on 03/02/2021.  At today's visit, he reports feeling fair.  No recent hospitalizations, surgeries, or changes in baseline health status.  He complains of some intermittent dizziness.    He admits to easy bruising, but denies petechial rash.  No major blood loss episodes such as [employer] taxis, hematemesis, hematochezia, melena, or hematuria.  He denies any B symptoms such as fever, chills, night sweats, unintentional weight loss.  He has 50% energy and 100% appetite. He endorses that he is maintaining a stable weight.    REVIEW OF SYSTEMS:  Review of Systems  Constitutional:  Positive for fatigue. Negative for appetite change, chills, diaphoresis, fever and unexpected weight change.  HENT:   Negative for lump/mass and nosebleeds.   Eyes:  Negative for eye problems.  Respiratory:  Negative for cough, hemoptysis and shortness of breath.   Cardiovascular:  Negative for chest pain, leg swelling and palpitations.  Gastrointestinal:  Negative for abdominal pain, blood in stool, constipation, diarrhea, nausea and vomiting.  Genitourinary:  Negative for hematuria.   Skin: Negative.   Neurological:  Positive for dizziness. Negative for headaches and light-headedness.  Hematological:  Does Brian bruise/bleed easily.     PAST MEDICAL/SURGICAL HISTORY:  Past Medical History:  Diagnosis Date   Arthritis    Atypical chest pain    Low-risk exercise stress Cardiolite EF 60% April 2008. Reportedly normal remote cardiac catheterization @ [facility] EF  60%...nuclear....2008   Bradycardia    hospital 03/2010 stopped beta blocker   Complication of anesthesia    Diabetes mellitus without complication ([place])    Dyslipidemia    Hypertension    Mild aortic regurgitation with LV dilation by prior echocardiogram    Neuropathy    in feet   Obesity    Obstructive sleep apnea    C-Pap compliant   PONV (postoperative nausea and vomiting)    Thrombocytopenia ([place])    mild follow Dr. 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   Procedure: LEFT TOTAL KNEE ARTHROPLASTY;  Surgeon: Brian Civil, MD;  Location: [facility];  Service: Orthopedics;  Laterality: Left;     SOCIAL HISTORY:  Social History   Socioeconomic History   Marital status: Married    Spouse name: Brian [name]   Number of children: 2   Years of education: Brian [name]   Highest education level: Some college, no degree  Occupational History   Occupation: retired  Tobacco Use   Smoking status: Never   Smokeless tobacco: Never  Vaping Use   Vaping Use: Never used  Substance and Sexual Activity   Alcohol use: Brian Currently    Comment: wine on occasion  Drug use: No   Sexual activity: Brian Currently  Other Topics Concern   Brian [name]  Social History Narrative   Lives at home with wife   Right handed   Caffeine: Diet coke roughly 12 oz in a day    Social Determinants of Health   Financial Resource Strain: Low Risk    Difficulty of Paying Living Expenses: Brian hard at all  Food Insecurity: No Food Insecurity   Worried About Charity fundraiser in the Last Year: Never true   Arboriculturist in  the Last Year: Never true  Transportation Needs: No Transportation Needs   Lack of Transportation (Medical): No   Lack of Transportation (Non-Medical): No  Physical Activity: Inactive   Days of Exercise per Week: 0 days   Minutes of Exercise per Session: 0 min  Stress: No Stress Concern Present   Feeling of Stress : Brian at all  Social Connections: Moderately Integrated   Frequency of Communication with Friends and Family: More than three times a week   Frequency of Social Gatherings with Friends and Family: Three times a week   Attends Religious Services: 1 to 4 times per year   Active Member of Clubs or Organizations: No   Attends Archivist Meetings: Never   Marital Status: Married  Human resources officer Violence: Brian At Risk   Fear of Current or Ex-Partner: No   Emotionally Abused: No   Physically Abused: No   Sexually Abused: No    FAMILY HISTORY:  Family History  Problem Relation Age of Onset   Heart attack Mother        fatal, cause of death   Heart attack Maternal Grandmother    Memory loss Neg Hx     CURRENT MEDICATIONS:  Outpatient Encounter Medications as of 07/08/2021  Medication Sig Note   acetaminophen (TYLENOL) 500 MG tablet Take 1,000 mg by mouth every 6 (six) hours as needed for mild pain or moderate pain.     amLODipine (NORVASC) 10 MG tablet Take 10 mg by mouth daily.     atorvastatin (LIPITOR) 20 MG tablet Take 20 mg by mouth daily.    B Complex Vitamins (VITAMIN B-COMPLEX) TABS Take 1 tablet by mouth daily.     fluticasone (FLONASE) 50 MCG/ACT nasal spray Place 2 sprays into both nostrils at bedtime.     gabapentin (NEURONTIN) 100 MG capsule Take 100-300 mg by mouth at bedtime as needed (for pain).     glipiZIDE (GLUCOTROL XL) 5 MG 24 hr tablet Take 5 mg by mouth 2 (two) times daily.    Glucosamine-Chondroit-Vit C-Mn (GLUCOSAMINE 1500 COMPLEX PO) Take 1 tablet by mouth daily.     Glucose Blood (GNP TRUE METRIX GLUCOSE STRIPS VI) USE THREE TIMES DAILY     Melatonin 10 MG CAPS Take 10 mg by mouth at bedtime.     metFORMIN (GLUCOPHAGE) 500 MG tablet Take 500 mg by mouth 4 (four) times daily.     mupirocin ointment (BACTROBAN) 2 % Apply 1 application topically 3 times/day as needed-between meals & bedtime. 01/26/2020: on hand if needed   olmesartan (BENICAR) 40 MG tablet Take 40 mg by mouth daily.     vitamin B-12 (CYANOCOBALAMIN) 1000 MCG tablet Take 1,000 mcg by mouth daily.    No facility-administered encounter medications on [name] as of 07/08/2021.    ALLERGIES:  Allergies  Allergen Reactions   Contrast Media [Iodinated Diagnostic Agents] Other (See Comments)    Allergic to MRI  contrast. Eyes swelling very badly    Penicillin G    Penicillins Other (See Comments)    dizziness   Gemtesa [Vibegron] Rash    Patient reports red spots on his arms and itching     PHYSICAL EXAM:  ECOG PERFORMANCE STATUS: 1 - Symptomatic but completely ambulatory  There were no vitals filed for this visit. There were no vitals filed for this visit. Physical Exam Constitutional:      Appearance: Normal appearance. He is obese.  HENT:     Head: Normocephalic and atraumatic.     Mouth/Throat:     Mouth: Mucous membranes are moist.  Eyes:     Extraocular Movements: Extraocular movements intact.     Pupils: Pupils are equal, round, and reactive to light.  Cardiovascular:     Rate and Rhythm: Normal rate and regular rhythm.     Pulses: Normal pulses.     Heart sounds: Normal heart sounds.  Pulmonary:     Effort: Pulmonary effort is normal.     Breath sounds: Normal breath sounds.  Abdominal:     General: Bowel sounds are normal.     Palpations: Abdomen is soft.     Tenderness: There is no abdominal tenderness.  Musculoskeletal:        General: No swelling.     Right lower leg: No edema.     Left lower leg: No edema.  Lymphadenopathy:     Cervical: No cervical adenopathy.  Skin:    General: Skin is warm and dry.     Findings: Bruising  present.  Neurological:     General: No focal deficit present.     Mental Status: He is alert and oriented to person, place, and time.  Psychiatric:        Mood and Affect: Mood normal.        Behavior: Behavior normal.     LABORATORY DATA:  I have reviewed the labs as listed.  CBC    Component Value Date/Time   WBC 5.8 07/02/2021 1049   RBC 4.55 07/02/2021 1049   HGB 14.7 07/02/2021 1049   HCT 41.5 07/02/2021 1049   PLT 61 (L) 07/02/2021 1049   MCV 91.2 07/02/2021 1049   MCH 32.3 07/02/2021 1049   MCHC 35.4 07/02/2021 1049   RDW 12.4 07/02/2021 1049   LYMPHSABS 1.6 07/02/2021 1049   MONOABS 0.5 07/02/2021 1049   EOSABS 0.3 07/02/2021 1049   BASOSABS 0.0 07/02/2021 1049   CMP Latest Ref Rng & Units 07/02/2021 10/07/2020 05/08/2020  Glucose 70 - 99 mg/dL 191(H) 223(H) 155(H)  BUN 8 - 23 mg/dL _0 Creatinine 0.61 - 1.24 mg/dL 0.97 0.99 0.90  Sodium 135 - 145 mmol/L 134(L) 137 137  Potassium 3.5 - 5.1 mmol/L 3.6 3.5 3.8  Chloride 98 - 111 mmol/L 104 101 106  CO2 22 - 32 mmol/L _1 Calcium 8.9 - 10.3 mg/dL 8.6(L) 9.4 9.1  Total Protein 6.5 - 8.1 g/dL 7.3 - 7.1  Total Bilirubin 0.3 - 1.2 mg/dL 0.6 - 0.6  Alkaline Phos 38 - 126 U/L 57 - 53  AST 15 - 41 U/L 24 - 17  ALT 0 - 44 U/L 23 - 18    DIAGNOSTIC IMAGING:  I have independently reviewed the relevant imaging and discussed with the patient.  ASSESSMENT & PLAN: 1.  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  Bone marrow biopsy done in [place] in 2009  was reportedly normal.   - CTAP showed fatty infiltration of the liver with normal spleen. - Work-up for nutritional deficiencies, plasma cell disorder was negative.  Infectious etiology was negative.  LDH was normal. - Platelet count dropped to 29 on 01/26/2020 and was treated with dexamethasone for 4 days. (Dexamethasone made to him have epigastric pain, indigestion, burping and hiccups.) - Peak platelet count  reached 108 on 01/31/2020. - As he cannot tolerate dexamethasone, future treatment options include Promacta. - Denies any bleeding episodes.  Admits to easy bruising - Most recent labs (07/02/2021): Platelets 61, at baseline.  LDH normal. - PLAN: No indication for treatment at this time.  RTC in 4 months with repeat labs.  We will also check B12 and methylmalonic acid.    PLAN SUMMARY & DISPOSITION: - Labs in 6 months - RTC after labs  All questions were answered. The patient knows to call the clinic with any problems, questions or concerns.  Medical decision making: Low  Time spent on visit: I spent 15 minutes counseling the patient face to face. The total time spent in the appointment was 25 minutes and more than 50% was on counseling.   Harriett Rush, PA-C  07/08/21 1:00 PM

## 2021-07-08 ENCOUNTER — Other Ambulatory Visit: Payer: Self-pay

## 2021-07-08 ENCOUNTER — Inpatient Hospital Stay (HOSPITAL_BASED_OUTPATIENT_CLINIC_OR_DEPARTMENT_OTHER): Payer: Medicare Other | Admitting: Physician Assistant

## 2021-07-08 VITALS — BP 164/87 | HR 69 | Temp 98.7°F | Resp 16 | Wt 269.3 lb

## 2021-07-08 DIAGNOSIS — Z79899 Other long term (current) drug therapy: Secondary | ICD-10-CM | POA: Diagnosis not present

## 2021-07-08 DIAGNOSIS — I10 Essential (primary) hypertension: Secondary | ICD-10-CM | POA: Diagnosis not present

## 2021-07-08 DIAGNOSIS — Z7984 Long term (current) use of oral hypoglycemic drugs: Secondary | ICD-10-CM | POA: Diagnosis not present

## 2021-07-08 DIAGNOSIS — E119 Type 2 diabetes mellitus without complications: Secondary | ICD-10-CM | POA: Diagnosis not present

## 2021-07-08 DIAGNOSIS — E538 Deficiency of other specified B group vitamins: Secondary | ICD-10-CM

## 2021-07-08 DIAGNOSIS — D693 Immune thrombocytopenic purpura: Secondary | ICD-10-CM | POA: Diagnosis not present

## 2021-07-08 DIAGNOSIS — E785 Hyperlipidemia, unspecified: Secondary | ICD-10-CM | POA: Diagnosis not present

## 2021-07-08 DIAGNOSIS — D696 Thrombocytopenia, unspecified: Secondary | ICD-10-CM | POA: Diagnosis not present

## 2021-07-08 NOTE — Patient Instructions (Signed)
[place] [facility] Discharge Instructions  You were seen today by Tarri Abernethy PA-C for your low platelets (thrombocytopenia).  Your platelets are stable right now - no need for treatment at this time.     LABS: Return in 4 months for repeat labs   OTHER TESTS: None at this time  MEDICATIONS: Continue B12 supplements  FOLLOW-UP APPOINTMENT: Office visit in 4 months, after labs   Thank you for choosing [place] [facility] to provide your oncology and hematology care.  To afford each patient quality time with our provider, please arrive at least 15 minutes before your scheduled appointment time.   If you have a lab appointment with the [facility] please come in thru the Main Entrance and check in at the main information desk.  You need to re-schedule your appointment should you arrive 10 or more minutes late.  We strive to give you quality time with our providers, and arriving late affects you and other patients whose appointments are after yours.  Also, if you no show three or more times for appointments you may be dismissed from the clinic at the providers discretion.     Again, thank you for choosing [facility].  Our hope is that these requests will decrease the amount of time that you wait before being seen by our physicians.       _____________________________________________________________  Should you have questions after your visit to [facility], please contact our office at [phone] and follow the prompts.  Our office hours are 8:00 a.m. and 4:30 p.m. Monday - Friday.  Please note that voicemails left after 4:00 p.m. may not be returned until the following business day.  We are closed weekends and major holidays.  You do have access to a nurse 24-7, just call the main number to the clinic [phone] and do not press any options, hold on the line and a nurse will answer the phone.     For prescription refill requests, have your pharmacy contact our office and allow 72 hours.    Due to Covid, you will need to wear a mask upon entering the hospital. If you do not have a mask, a mask will be given to you at the Main Entrance upon arrival. For doctor visits, patients may have 1 support person age 60 or older with them. For treatment visits, patients can not have anyone with them due to social distancing guidelines and our immunocompromised population.

## 2021-07-10 DIAGNOSIS — E113211 Type 2 diabetes mellitus with mild nonproliferative diabetic retinopathy with macular edema, right eye: Secondary | ICD-10-CM | POA: Diagnosis not present

## 2021-07-23 ENCOUNTER — Other Ambulatory Visit: Payer: Medicare Other

## 2021-07-24 DIAGNOSIS — I10 Essential (primary) hypertension: Secondary | ICD-10-CM | POA: Diagnosis not present

## 2021-07-24 DIAGNOSIS — E1165 Type 2 diabetes mellitus with hyperglycemia: Secondary | ICD-10-CM | POA: Diagnosis not present

## 2021-07-25 DIAGNOSIS — R030 Elevated blood-pressure reading, without diagnosis of hypertension: Secondary | ICD-10-CM | POA: Diagnosis not present

## 2021-07-25 DIAGNOSIS — L03317 Cellulitis of buttock: Secondary | ICD-10-CM | POA: Diagnosis not present

## 2021-07-30 ENCOUNTER — Ambulatory Visit: Payer: Medicare Other | Admitting: Urology

## 2021-08-06 DIAGNOSIS — Z20828 Contact with and (suspected) exposure to other viral communicable diseases: Secondary | ICD-10-CM | POA: Diagnosis not present

## 2021-08-06 DIAGNOSIS — Z23 Encounter for immunization: Secondary | ICD-10-CM | POA: Diagnosis not present

## 2021-08-25 DIAGNOSIS — E782 Mixed hyperlipidemia: Secondary | ICD-10-CM | POA: Diagnosis not present

## 2021-08-25 DIAGNOSIS — E1140 Type 2 diabetes mellitus with diabetic neuropathy, unspecified: Secondary | ICD-10-CM | POA: Diagnosis not present

## 2021-09-01 DIAGNOSIS — D696 Thrombocytopenia, unspecified: Secondary | ICD-10-CM | POA: Diagnosis not present

## 2021-09-01 DIAGNOSIS — L853 Xerosis cutis: Secondary | ICD-10-CM | POA: Diagnosis not present

## 2021-09-01 DIAGNOSIS — I129 Hypertensive chronic kidney disease with stage 1 through stage 4 chronic kidney disease, or unspecified chronic kidney disease: Secondary | ICD-10-CM | POA: Diagnosis not present

## 2021-09-01 DIAGNOSIS — R809 Proteinuria, unspecified: Secondary | ICD-10-CM | POA: Diagnosis not present

## 2021-09-01 DIAGNOSIS — R42 Dizziness and giddiness: Secondary | ICD-10-CM | POA: Diagnosis not present

## 2021-09-01 DIAGNOSIS — G9009 Other idiopathic peripheral autonomic neuropathy: Secondary | ICD-10-CM | POA: Diagnosis not present

## 2021-09-01 DIAGNOSIS — N182 Chronic kidney disease, stage 2 (mild): Secondary | ICD-10-CM | POA: Insufficient documentation

## 2021-09-01 DIAGNOSIS — E782 Mixed hyperlipidemia: Secondary | ICD-10-CM | POA: Diagnosis not present

## 2021-09-01 DIAGNOSIS — E1140 Type 2 diabetes mellitus with diabetic neuropathy, unspecified: Secondary | ICD-10-CM | POA: Diagnosis not present

## 2021-09-01 DIAGNOSIS — R972 Elevated prostate specific antigen [PSA]: Secondary | ICD-10-CM | POA: Diagnosis not present

## 2021-09-01 DIAGNOSIS — G4733 Obstructive sleep apnea (adult) (pediatric): Secondary | ICD-10-CM | POA: Diagnosis not present

## 2021-09-01 DIAGNOSIS — Z0001 Encounter for general adult medical examination with abnormal findings: Secondary | ICD-10-CM | POA: Diagnosis not present

## 2021-09-28 DIAGNOSIS — E1140 Type 2 diabetes mellitus with diabetic neuropathy, unspecified: Secondary | ICD-10-CM | POA: Diagnosis not present

## 2021-09-28 DIAGNOSIS — L0291 Cutaneous abscess, unspecified: Secondary | ICD-10-CM | POA: Diagnosis not present

## 2021-09-29 DIAGNOSIS — E113391 Type 2 diabetes mellitus with moderate nonproliferative diabetic retinopathy without macular edema, right eye: Secondary | ICD-10-CM | POA: Diagnosis not present

## 2021-09-29 DIAGNOSIS — H52201 Unspecified astigmatism, right eye: Secondary | ICD-10-CM | POA: Diagnosis not present

## 2021-09-29 DIAGNOSIS — Z961 Presence of intraocular lens: Secondary | ICD-10-CM | POA: Diagnosis not present

## 2021-09-30 DIAGNOSIS — H6121 Impacted cerumen, right ear: Secondary | ICD-10-CM | POA: Diagnosis not present

## 2021-11-02 ENCOUNTER — Inpatient Hospital Stay (HOSPITAL_COMMUNITY): Payer: Medicare Other

## 2021-11-09 ENCOUNTER — Ambulatory Visit (HOSPITAL_COMMUNITY): Payer: Medicare Other | Admitting: Physician Assistant

## 2021-11-26 DIAGNOSIS — L720 Epidermal cyst: Secondary | ICD-10-CM | POA: Diagnosis not present

## 2021-12-25 DIAGNOSIS — E1140 Type 2 diabetes mellitus with diabetic neuropathy, unspecified: Secondary | ICD-10-CM | POA: Diagnosis not present

## 2021-12-25 DIAGNOSIS — E782 Mixed hyperlipidemia: Secondary | ICD-10-CM | POA: Diagnosis not present

## 2021-12-30 DIAGNOSIS — G4733 Obstructive sleep apnea (adult) (pediatric): Secondary | ICD-10-CM | POA: Diagnosis not present

## 2021-12-30 DIAGNOSIS — G9009 Other idiopathic peripheral autonomic neuropathy: Secondary | ICD-10-CM | POA: Diagnosis not present

## 2021-12-30 DIAGNOSIS — D696 Thrombocytopenia, unspecified: Secondary | ICD-10-CM | POA: Diagnosis not present

## 2021-12-30 DIAGNOSIS — S80811A Abrasion, right lower leg, initial encounter: Secondary | ICD-10-CM | POA: Insufficient documentation

## 2021-12-30 DIAGNOSIS — L853 Xerosis cutis: Secondary | ICD-10-CM | POA: Diagnosis not present

## 2021-12-30 DIAGNOSIS — E1140 Type 2 diabetes mellitus with diabetic neuropathy, unspecified: Secondary | ICD-10-CM | POA: Diagnosis not present

## 2021-12-30 DIAGNOSIS — B351 Tinea unguium: Secondary | ICD-10-CM | POA: Insufficient documentation

## 2021-12-30 DIAGNOSIS — R42 Dizziness and giddiness: Secondary | ICD-10-CM | POA: Diagnosis not present

## 2021-12-30 DIAGNOSIS — R32 Unspecified urinary incontinence: Secondary | ICD-10-CM | POA: Diagnosis not present

## 2021-12-30 DIAGNOSIS — R972 Elevated prostate specific antigen [PSA]: Secondary | ICD-10-CM | POA: Diagnosis not present

## 2021-12-30 DIAGNOSIS — E782 Mixed hyperlipidemia: Secondary | ICD-10-CM | POA: Diagnosis not present

## 2021-12-30 DIAGNOSIS — I129 Hypertensive chronic kidney disease with stage 1 through stage 4 chronic kidney disease, or unspecified chronic kidney disease: Secondary | ICD-10-CM | POA: Diagnosis not present

## 2021-12-30 DIAGNOSIS — R809 Proteinuria, unspecified: Secondary | ICD-10-CM | POA: Diagnosis not present

## 2022-01-12 DIAGNOSIS — H04123 Dry eye syndrome of bilateral lacrimal glands: Secondary | ICD-10-CM | POA: Diagnosis not present

## 2022-01-12 DIAGNOSIS — H35033 Hypertensive retinopathy, bilateral: Secondary | ICD-10-CM | POA: Diagnosis not present

## 2022-01-12 DIAGNOSIS — E113393 Type 2 diabetes mellitus with moderate nonproliferative diabetic retinopathy without macular edema, bilateral: Secondary | ICD-10-CM | POA: Diagnosis not present

## 2022-01-12 DIAGNOSIS — H43813 Vitreous degeneration, bilateral: Secondary | ICD-10-CM | POA: Diagnosis not present

## 2022-01-15 ENCOUNTER — Inpatient Hospital Stay (HOSPITAL_COMMUNITY): Payer: Medicare Other | Attending: Hematology

## 2022-01-15 DIAGNOSIS — E538 Deficiency of other specified B group vitamins: Secondary | ICD-10-CM

## 2022-01-15 DIAGNOSIS — D693 Immune thrombocytopenic purpura: Secondary | ICD-10-CM

## 2022-01-15 DIAGNOSIS — D696 Thrombocytopenia, unspecified: Secondary | ICD-10-CM | POA: Diagnosis not present

## 2022-01-15 LAB — LACTATE DEHYDROGENASE: LDH: 137 U/L (ref 98–192)

## 2022-01-15 LAB — CBC WITH DIFFERENTIAL/PLATELET
Abs Immature Granulocytes: 0.03 10*3/uL (ref 0.00–0.07)
Basophils Absolute: 0 10*3/uL (ref 0.0–0.1)
Basophils Relative: 1 %
Eosinophils Absolute: 0.3 10*3/uL (ref 0.0–0.5)
Eosinophils Relative: 5 %
HCT: 40.3 % (ref 39.0–52.0)
Hemoglobin: 14.1 g/dL (ref 13.0–17.0)
Immature Granulocytes: 1 %
Lymphocytes Relative: 27 %
Lymphs Abs: 1.8 10*3/uL (ref 0.7–4.0)
MCH: 31.7 pg (ref 26.0–34.0)
MCHC: 35 g/dL (ref 30.0–36.0)
MCV: 90.6 fL (ref 80.0–100.0)
Monocytes Absolute: 0.5 10*3/uL (ref 0.1–1.0)
Monocytes Relative: 8 %
Neutro Abs: 4 10*3/uL (ref 1.7–7.7)
Neutrophils Relative %: 58 %
Platelets: 54 10*3/uL — ABNORMAL LOW (ref 150–400)
RBC: 4.45 MIL/uL (ref 4.22–5.81)
RDW: 12.6 % (ref 11.5–15.5)
WBC: 6.7 10*3/uL (ref 4.0–10.5)
nRBC: 0 % (ref 0.0–0.2)

## 2022-01-15 LAB — COMPREHENSIVE METABOLIC PANEL
ALT: 21 U/L (ref 0–44)
AST: 23 U/L (ref 15–41)
Albumin: 4 g/dL (ref 3.5–5.0)
Alkaline Phosphatase: 58 U/L (ref 38–126)
Anion gap: 10 (ref 5–15)
BUN: 21 mg/dL (ref 8–23)
CO2: 21 mmol/L — ABNORMAL LOW (ref 22–32)
Calcium: 9.1 mg/dL (ref 8.9–10.3)
Chloride: 102 mmol/L (ref 98–111)
Creatinine, Ser: 1.16 mg/dL (ref 0.61–1.24)
GFR, Estimated: >60 mL/min (ref >60)
Glucose, Bld: 197 mg/dL — ABNORMAL HIGH (ref 70–99)
Potassium: 4.1 mmol/L (ref 3.5–5.1)
Sodium: 133 mmol/L — ABNORMAL LOW (ref 135–145)
Total Bilirubin: 0.7 mg/dL (ref 0.3–1.2)
Total Protein: 7.1 g/dL (ref 6.5–8.1)

## 2022-01-15 LAB — VITAMIN B12: Vitamin B-12: 355 pg/mL (ref 180–914)

## 2022-01-17 LAB — METHYLMALONIC ACID, SERUM: Methylmalonic Acid, Quantitative: 173 nmol/L (ref 0–378)

## 2022-01-20 ENCOUNTER — Encounter: Payer: Self-pay | Admitting: Orthopedic Surgery

## 2022-01-20 ENCOUNTER — Ambulatory Visit (INDEPENDENT_AMBULATORY_CARE_PROVIDER_SITE_OTHER): Payer: Medicare Other | Admitting: Orthopedic Surgery

## 2022-01-20 ENCOUNTER — Ambulatory Visit: Payer: Medicare Other | Admitting: Orthopedic Surgery

## 2022-01-20 ENCOUNTER — Ambulatory Visit: Payer: Medicare Other

## 2022-01-20 ENCOUNTER — Other Ambulatory Visit: Payer: Self-pay

## 2022-01-20 VITALS — BP 187/93 | HR 76 | Ht 71.0 in | Wt 266.8 lb

## 2022-01-20 DIAGNOSIS — M25561 Pain in right knee: Secondary | ICD-10-CM

## 2022-01-20 DIAGNOSIS — M25361 Other instability, right knee: Secondary | ICD-10-CM

## 2022-01-20 DIAGNOSIS — S83521A Sprain of posterior cruciate ligament of right knee, initial encounter: Secondary | ICD-10-CM

## 2022-01-20 NOTE — Patient Instructions (Signed)
Wear the knee brace for activity during the day ? ?Do the exercises for 6 weeks ?

## 2022-01-20 NOTE — Progress Notes (Signed)
Chief Complaint  ?Patient presents with  ? Knee Pain  ?  Pt fell/ right ?DOI 01/06/22 he states he injured his lower leg but now his knee feels like it wants to lock up    ? ? ?HPI: Brian [name] is status post left total knee did well comes in today because of right leg pain after falling ? ?The patient says he falls frequently and has been doing so over the last few years because not clear ? ?He hit his shin bone when he tripped on the steps going down the steps.  He then noticed that his right leg seems like it is giving way a lot.  Although the knee is getting better he came in for evaluation to see what was going on ? ?Past Medical History:  ?Diagnosis Date  ? Arthritis   ? Atypical chest pain   ? Low-risk exercise stress Cardiolite EF 60% April 2008. Reportedly normal remote cardiac catheterization @ [place] EF 60%...nuclear....2008  ? Bradycardia   ? hospital 03/2010 stopped beta blocker  ? Complication of anesthesia   ? Diabetes mellitus without complication ([place])   ? Dyslipidemia   ? Hypertension   ? Mild aortic regurgitation with LV dilation by prior echocardiogram   ? Neuropathy   ? in feet  ? Obesity   ? Obstructive sleep apnea   ? C-Pap compliant  ? PONV (postoperative nausea and vomiting)   ? Thrombocytopenia ([place])   ? mild follow Dr. Alonna Minium  ? Vertigo   ? Hospital 03/2010  ? ? ?BP (!) 187/93   Pulse 76   Ht '5\' 11"'$  (1.803 m)   Wt 266 lb 12.8 oz (121 kg)   BMI 37.21 kg/m?  ? ? ?General appearance: Well-developed well-nourished no gross deformities ? ?Cardiovascular normal pulse and perfusion normal color without edema ? ?Neurologically no sensation loss or deficits or pathologic reflexes ? ?Psychological: Awake alert and oriented x3 mood and affect normal ? ?Skin no lacerations or ulcerations no nodularity no palpable masses, no erythema or nodularity ? ?Musculoskeletal: The right knee actually has a posterior drawer sign grade 1 with a firm posterior endpoint the knee is subluxated posteriorly and the femoral  condyle and tibial plateau are not offset in the normal manner.  There is no effusion he has full range of motion collateral ligaments are stable. ? ?Imaging Right knee x-ray AP lateral and sunrise ?  ?Pain instability after falling ?  ?The x-rays show some mild narrowing of the joint spaces no osteophytes ?  ?The knee is in normal alignment ?  ?Mild OA grade 1 right knee ? ?A/P ? 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

## 2022-01-22 ENCOUNTER — Ambulatory Visit (HOSPITAL_COMMUNITY): Payer: Medicare Other | Admitting: Physician Assistant

## 2022-01-27 ENCOUNTER — Ambulatory Visit: Payer: Medicare Other | Admitting: Psychology

## 2022-02-11 NOTE — Progress Notes (Signed)
? ?Virtual Visit via Telephone Note ?[place] ? ?I connected with Brian [name]  on 02/12/22 at  9:09 AM by telephone and verified that I am speaking with the correct person using two identifiers. ? ?Location: ?Patient: Home ?Provider: [place] ?  ?I discussed the limitations, risks, security and privacy concerns of performing an evaluation and management service by telephone and the availability of in person appointments. I also discussed with the patient that there may be a patient responsible charge related to this service. The patient expressed understanding and agreed to proceed. ? ? ?REASON FOR VISIT:  ?Follow-up for immune mediated thrombocytopenia ?  ?PRIOR THERAPY: None ?  ?CURRENT THERAPY: Observation ?  ?INTERVAL HISTORY:  ?Brian [name] 81 y.o. 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  He was last seen by Brian Abernethy PA-C on 07/08/2021. ?  ?At today's visit, he reports feeling fair.  No recent hospitalizations, surgeries, or changes in baseline health status. He admits to easy bruising, but denies petechial rash.  No major blood loss episodes such as epistaxis, hematemesis, hematochezia, melena, or hematuria.  He denies any B symptoms such as fever, chills, night sweats, unintentional weight loss. ?  ?He has 50% energy and 100% appetite. He endorses that he is maintaining a stable weight. ? ?  ?OBSERVATIONS/OBJECTIVE: ?Review of Systems  ?Constitutional:  Positive for malaise/fatigue. Negative for chills, diaphoresis, fever and weight loss.  ?Respiratory:  Positive for cough. Negative for shortness of breath.   ?Cardiovascular:  Negative for chest pain and palpitations.  ?Gastrointestinal:  Negative for abdominal pain, blood in stool, melena, nausea and vomiting.  ?Neurological:  Positive for dizziness. Negative for headaches.   ? ?PHYSICAL EXAM (per limitations of virtual telephone visit): The patient is alert and oriented x 3,  exhibiting adequate mentation, good mood, and ability to speak in full sentences and execute sound judgement. ? ? ?ASSESSMENT & PLAN: ?1.  Immune mediated thrombocytopenia: ?- History of mild to moderate thrombocytopenia since 2000.  Bone marrow biopsy done in [place] in 2009 was reportedly normal.   ?- CTAP showed fatty infiltration of the liver with normal spleen. ?- Work-up for nutritional deficiencies, plasma cell disorder was negative.  Infectious etiology was negative.  LDH was normal. ?- Platelet count dropped to 29 on 01/26/2020 and was treated with dexamethasone for 4 days. (Dexamethasone made to him have epigastric pain, indigestion, burping and hiccups.) ?- Peak platelet count reached 108 on 01/31/2020. ?- As he cannot tolerate dexamethasone, future treatment options include Promacta. ?- Denies any bleeding episodes.  Admits to easy bruising   ?- Most recent labs (01/15/2022): Platelets 54, at baseline.  CMP unremarkable.  LDH normal.  Normal vitamin B12/methylmalonic acid. ?- PLAN: No indication for treatment at this time.  RTC in 4 months with repeat labs.  We will also check B12 and methylmalonic acid.  ?** We will plan on alternating phone and office visits. ? ? ?FOLLOW UP INSTRUCTIONS: ?Same-day labs (CBC only) and office visit in 4 months ? ?  ?I discussed the assessment and treatment plan with the patient. The patient was provided an opportunity to ask questions and all were answered. The patient agreed with the plan and demonstrated an understanding of the instructions. ?  ?The patient was advised to call back or seek an in-person evaluation if the symptoms worsen or if the condition fails to improve as anticipated. ? ?I provided 12 minutes of non-face-to-face time during this encounter. ? ? ?  Brian Rush, PA-C ?02/12/22 9:32 AM  ?

## 2022-02-12 ENCOUNTER — Inpatient Hospital Stay (HOSPITAL_COMMUNITY): Payer: Medicare Other | Attending: Hematology | Admitting: Physician Assistant

## 2022-02-12 DIAGNOSIS — D693 Immune thrombocytopenic purpura: Secondary | ICD-10-CM | POA: Diagnosis not present

## 2022-03-14 DIAGNOSIS — R5383 Other fatigue: Secondary | ICD-10-CM | POA: Insufficient documentation

## 2022-03-16 DIAGNOSIS — E662 Morbid (severe) obesity with alveolar hypoventilation: Secondary | ICD-10-CM | POA: Diagnosis not present

## 2022-03-16 DIAGNOSIS — Z6837 Body mass index (BMI) 37.0-37.9, adult: Secondary | ICD-10-CM | POA: Diagnosis not present

## 2022-03-16 DIAGNOSIS — F5104 Psychophysiologic insomnia: Secondary | ICD-10-CM | POA: Insufficient documentation

## 2022-03-16 DIAGNOSIS — G629 Polyneuropathy, unspecified: Secondary | ICD-10-CM | POA: Diagnosis not present

## 2022-03-16 DIAGNOSIS — G4700 Insomnia, unspecified: Secondary | ICD-10-CM | POA: Diagnosis not present

## 2022-03-16 DIAGNOSIS — R5383 Other fatigue: Secondary | ICD-10-CM | POA: Diagnosis not present

## 2022-03-16 DIAGNOSIS — E1129 Type 2 diabetes mellitus with other diabetic kidney complication: Secondary | ICD-10-CM | POA: Diagnosis not present

## 2022-04-09 DIAGNOSIS — E118 Type 2 diabetes mellitus with unspecified complications: Secondary | ICD-10-CM | POA: Diagnosis not present

## 2022-04-09 DIAGNOSIS — G629 Polyneuropathy, unspecified: Secondary | ICD-10-CM | POA: Diagnosis not present

## 2022-04-09 DIAGNOSIS — E1140 Type 2 diabetes mellitus with diabetic neuropathy, unspecified: Secondary | ICD-10-CM | POA: Diagnosis not present

## 2022-05-04 DIAGNOSIS — R972 Elevated prostate specific antigen [PSA]: Secondary | ICD-10-CM | POA: Diagnosis not present

## 2022-05-04 DIAGNOSIS — E782 Mixed hyperlipidemia: Secondary | ICD-10-CM | POA: Diagnosis not present

## 2022-05-04 DIAGNOSIS — E1140 Type 2 diabetes mellitus with diabetic neuropathy, unspecified: Secondary | ICD-10-CM | POA: Diagnosis not present

## 2022-05-06 ENCOUNTER — Other Ambulatory Visit: Payer: Self-pay

## 2022-05-06 DIAGNOSIS — E1140 Type 2 diabetes mellitus with diabetic neuropathy, unspecified: Secondary | ICD-10-CM | POA: Diagnosis not present

## 2022-05-06 DIAGNOSIS — L853 Xerosis cutis: Secondary | ICD-10-CM | POA: Diagnosis not present

## 2022-05-06 DIAGNOSIS — I129 Hypertensive chronic kidney disease with stage 1 through stage 4 chronic kidney disease, or unspecified chronic kidney disease: Secondary | ICD-10-CM | POA: Diagnosis not present

## 2022-05-06 DIAGNOSIS — R809 Proteinuria, unspecified: Secondary | ICD-10-CM | POA: Diagnosis not present

## 2022-05-06 DIAGNOSIS — G9009 Other idiopathic peripheral autonomic neuropathy: Secondary | ICD-10-CM | POA: Diagnosis not present

## 2022-05-06 DIAGNOSIS — G4733 Obstructive sleep apnea (adult) (pediatric): Secondary | ICD-10-CM | POA: Diagnosis not present

## 2022-05-06 DIAGNOSIS — D696 Thrombocytopenia, unspecified: Secondary | ICD-10-CM | POA: Diagnosis not present

## 2022-05-06 DIAGNOSIS — E782 Mixed hyperlipidemia: Secondary | ICD-10-CM | POA: Diagnosis not present

## 2022-05-06 DIAGNOSIS — R42 Dizziness and giddiness: Secondary | ICD-10-CM | POA: Diagnosis not present

## 2022-05-06 DIAGNOSIS — R32 Unspecified urinary incontinence: Secondary | ICD-10-CM | POA: Diagnosis not present

## 2022-05-06 DIAGNOSIS — R972 Elevated prostate specific antigen [PSA]: Secondary | ICD-10-CM | POA: Diagnosis not present

## 2022-06-08 DIAGNOSIS — R32 Unspecified urinary incontinence: Secondary | ICD-10-CM | POA: Diagnosis not present

## 2022-06-11 NOTE — Progress Notes (Deleted)
Brian [name], Brian [name] [id number]   CLINIC:  Medical Oncology/Hematology  PCP:  Brian Squibb, MD [place] [address] [phone]   REASON FOR VISIT:  Follow-up for immune mediated thrombocytopenia   PRIOR THERAPY: None   CURRENT THERAPY: Observation   INTERVAL HISTORY:  Brian [name] 81 y.o. 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  He was last seen by Brian Abernethy PA-C on 02/12/2022.   At today's visit, he reports feeling fair.  No recent hospitalizations, surgeries, or changes in baseline health status.  *** *** He admits to easy bruising, but denies petechial rash. *** No major blood loss episodes such as epistaxis, hematemesis, hematochezia, melena, or hematuria. *** He denies any B symptoms such as fever, chills, night sweats, unintentional weight loss.   He has ***% energy and ***% appetite. He endorses that he is maintaining a stable weight.    REVIEW OF SYSTEMS: *** Review of Systems - Oncology    PAST MEDICAL/SURGICAL HISTORY:  Past Medical History:  Diagnosis Date   Arthritis    Atypical chest pain    Low-risk exercise stress Cardiolite EF 60% April 2008. Reportedly normal remote cardiac catheterization @ [place] EF 60%...nuclear....2008   Bradycardia    hospital 03/2010 stopped beta blocker   Complication of anesthesia    Diabetes mellitus without complication ([place])    Dyslipidemia    Hypertension    Mild aortic regurgitation with LV dilation by prior echocardiogram    Neuropathy    in feet   Obesity    Obstructive sleep apnea    C-Pap compliant   PONV (postoperative nausea and vomiting)    Thrombocytopenia ([place])    mild follow Dr. 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   Procedure: LEFT TOTAL KNEE ARTHROPLASTY;  Surgeon: Carole Civil, MD;  Location: [facility];  Service: Orthopedics;  Laterality: Left;     SOCIAL HISTORY:  Social History   Socioeconomic History   Marital status: Married    Spouse name: Not on file   Number of children: 2   Years of education: Not on file   Highest education level: Some college, no degree  Occupational History   Occupation: retired  Tobacco Use   Smoking status: Never   Smokeless tobacco: Never  Vaping Use   Vaping Use: Never used  Substance and Sexual Activity   Alcohol use: Not Currently    Comment: wine on occasion   Drug use: No   Sexual activity: Not Currently  Other Topics Concern   Not on file  Social History Narrative   Lives at home with wife   Right handed   Caffeine: Diet coke roughly 12 oz in a day    Social Determinants of Health   Financial Resource Strain: Low Risk  (09/29/2020)   Overall Financial Resource Strain (CARDIA)    Difficulty of Paying Living Expenses: Not hard at all  Food Insecurity: No Food Insecurity (09/29/2020)   Hunger Vital Sign    Worried About Running Out of Food  in the Last Year: Never true    [place] in the Last Year: Never true  Transportation Needs: No Transportation Needs (09/29/2020)   PRAPARE - Hydrologist (Medical): No    Lack of Transportation (Non-Medical): No  Physical Activity: Inactive (09/29/2020)   Exercise Vital Sign    Days of Exercise per Week: 0 days    Minutes of Exercise per Session: 0 min  Stress: No Stress Concern Present (09/29/2020)   [employer]    Feeling of Stress : Not at all  Social Connections: Moderately Integrated (09/29/2020)    Social Connection and Isolation Panel [NHANES]    Frequency of Communication with Friends and Family: More than three times a week    Frequency of Social Gatherings with Friends and Family: Three times a week    Attends Religious Services: 1 to 4 times per year    Active Member of Clubs or Organizations: No    Attends Archivist Meetings: Never    Marital Status: Married  Human resources officer Violence: Not At Risk (09/29/2020)   Humiliation, Afraid, Rape, and Kick questionnaire    Fear of Current or Ex-Partner: No    Emotionally Abused: No    Physically Abused: No    Sexually Abused: No    FAMILY HISTORY:  Family History  Problem Relation Age of Onset   Heart attack Mother        fatal, cause of death   Heart attack Maternal Grandmother    Memory loss Neg Hx     CURRENT MEDICATIONS:  Outpatient Encounter Medications as of 06/14/2022  Medication Sig Note   acetaminophen (TYLENOL) 500 MG tablet Take 1,000 mg by mouth every 6 (six) hours as needed for mild pain or moderate pain.  (Patient not taking: Reported on 07/08/2021)    amLODipine (NORVASC) 10 MG tablet Take 10 mg by mouth daily.     atorvastatin (LIPITOR) 20 MG tablet Take 20 mg by mouth daily.    B Complex Vitamins (VITAMIN B-COMPLEX) TABS Take 1 tablet by mouth daily.     doxycycline (VIBRAMYCIN) 100 MG capsule Take 100 mg by mouth in the morning and at bedtime.    fluticasone (FLONASE) 50 MCG/ACT nasal spray Place 2 sprays into both nostrils at bedtime.     gabapentin (NEURONTIN) 600 MG tablet Take 1,200 mg by mouth daily at 2 am.    glipiZIDE (GLUCOTROL XL) 5 MG 24 hr tablet Take 5 mg by mouth 2 (two) times daily.    Glucose Blood (GNP TRUE METRIX GLUCOSE STRIPS VI) USE THREE TIMES DAILY    Melatonin 10 MG CAPS Take 10 mg by mouth at bedtime.     metFORMIN (GLUCOPHAGE) 500 MG tablet Take 500 mg by mouth 4 (four) times daily.     mupirocin ointment (BACTROBAN) 2 % Apply 1 application topically 3 times/day as  needed-between meals & bedtime. (Patient not taking: Reported on 01/20/2022) 01/26/2020: on hand if needed   olmesartan (BENICAR) 40 MG tablet Take 40 mg by mouth daily.     triamcinolone cream (KENALOG) 0.5 %  (Patient not taking: Reported on 01/20/2022)    vitamin B-12 (CYANOCOBALAMIN) 1000 MCG tablet Take 1,000 mcg by mouth daily.    No facility-administered encounter medications on file as of 06/14/2022.    ALLERGIES:  Allergies  Allergen Reactions   Contrast Media [Iodinated Contrast Media] Other (See Comments)    Allergic to  MRI contrast. Eyes swelling very badly    Penicillin G    Penicillins Other (See Comments)    dizziness   Gemtesa [Vibegron] Rash    Patient reports red spots on his arms and itching     PHYSICAL EXAM: *** ECOG PERFORMANCE STATUS: {CHL ONC ECOG PS:[phone]}  There were no vitals filed for this visit. There were no vitals filed for this visit. Physical Exam   LABORATORY DATA:  I have reviewed the labs as listed.  CBC    Component Value Date/Time   WBC 6.7 01/15/2022 1018   RBC 4.45 01/15/2022 1018   HGB 14.1 01/15/2022 1018   HCT 40.3 01/15/2022 1018   PLT 54 (L) 01/15/2022 1018   MCV 90.6 01/15/2022 1018   MCH 31.7 01/15/2022 1018   MCHC 35.0 01/15/2022 1018   RDW 12.6 01/15/2022 1018   LYMPHSABS 1.8 01/15/2022 1018   MONOABS 0.5 01/15/2022 1018   EOSABS 0.3 01/15/2022 1018   BASOSABS 0.0 01/15/2022 1018      Latest Ref Rng & Units 01/15/2022   10:18 AM 07/02/2021   10:49 AM 10/07/2020   11:17 AM  CMP  Glucose 70 - 99 mg/dL 197  191  223   BUN 8 - 23 mg/dL 21  20  17    Creatinine 0.61 - 1.24 mg/dL 1.16  0.97  0.99   Sodium 135 - 145 mmol/L 133  134  137   Potassium 3.5 - 5.1 mmol/L 4.1  3.6  3.5   Chloride 98 - 111 mmol/L 102  104  101   CO2 22 - 32 mmol/L 21  22  20    Calcium 8.9 - 10.3 mg/dL 9.1  8.6  9.4   Total Protein 6.5 - 8.1 g/dL 7.1  7.3    Total Bilirubin 0.3 - 1.2 mg/dL 0.7  0.6    Alkaline Phos 38 - 126 U/L 58  57     AST 15 - 41 U/L 23  24    ALT 0 - 44 U/L 21  23      DIAGNOSTIC IMAGING:  I have independently reviewed the relevant imaging and discussed with the patient.  ASSESSMENT & PLAN: 1.  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  Bone marrow biopsy done in [place] in 2009 was reportedly normal.   - CTAP showed fatty infiltration of the liver with normal spleen. - Work-up for nutritional deficiencies, plasma cell disorder was negative.  Infectious etiology was negative.  LDH was normal. - Platelet count dropped to 29 on 01/26/2020 and was treated with dexamethasone for 4 days. (Dexamethasone made to him have epigastric pain, indigestion, burping and hiccups.) - Peak platelet count reached 108 on 01/31/2020. - As he cannot tolerate dexamethasone, future treatment options include Promacta. - Denies any bleeding episodes.  Admits to easy bruising  *** - Most recent CBC (06/14/2022): ***.  CMP, LDH, B12/MMA were unremarkable when checked in March 2023.  - PLAN: No indication for treatment at this time.  (Alternating phone and office visits) ***  - RTC in 4 months with repeat labs and phone visit.  ***    All questions were answered. The patient knows to call the clinic with any problems, questions or concerns.  Medical decision making: ***  Time spent on visit: I spent *** minutes counseling the patient face to face. The total time spent in the appointment was *** minutes and more than 50% was on counseling.   Eugene Garnet  Hervey Ard, PA-C  ***

## 2022-06-14 ENCOUNTER — Inpatient Hospital Stay: Payer: Medicare Other | Admitting: Physician Assistant

## 2022-06-14 ENCOUNTER — Inpatient Hospital Stay: Payer: Medicare Other | Attending: Hematology

## 2022-08-02 DIAGNOSIS — Z23 Encounter for immunization: Secondary | ICD-10-CM | POA: Diagnosis not present

## 2022-08-12 ENCOUNTER — Ambulatory Visit (INDEPENDENT_AMBULATORY_CARE_PROVIDER_SITE_OTHER): Payer: Medicare Other

## 2022-08-12 ENCOUNTER — Encounter: Payer: Self-pay | Admitting: Orthopedic Surgery

## 2022-08-12 ENCOUNTER — Telehealth: Payer: Self-pay | Admitting: Orthopedic Surgery

## 2022-08-12 ENCOUNTER — Ambulatory Visit (INDEPENDENT_AMBULATORY_CARE_PROVIDER_SITE_OTHER): Payer: Medicare Other | Admitting: Orthopedic Surgery

## 2022-08-12 VITALS — Ht 71.0 in | Wt 270.0 lb

## 2022-08-12 DIAGNOSIS — M25562 Pain in left knee: Secondary | ICD-10-CM

## 2022-08-12 DIAGNOSIS — Z96652 Presence of left artificial knee joint: Secondary | ICD-10-CM | POA: Diagnosis not present

## 2022-08-12 NOTE — Telephone Encounter (Signed)
Patient called via voice message received 08/11/22, 4:51pm, relaying he had a fall a few days ago, on the knee he had a total knee replacement done (2017); states it is bruised down to the ankle. State he wants to see Dr Aline Brochure only. I returned patient's call this morning, verified he has had no treatment or emergency care as of yet.  Based on current template, please advise.

## 2022-08-12 NOTE — Progress Notes (Signed)
New problem same day appt   Chief Complaint  Patient presents with   Knee Pain    Lt knee pain after fall DOI 08/03/22. Pt states he was fine the first few days, now having pain going down to the ankle. Also having a lot of swelling and discoloration going down into the lt foot.    81 year old male had a left total knee years ago was doing well in his normal state of health he fell had some anterior knee pain on the left and then a couple of days later he noticed bruising all the way down to his toes.  He does have a history of thrombocytopenia  His x-ray is normal and negative for fracture or loosening of the prosthesis  His knee function is such that he can walk with a cane he has full extension full flexion good straight leg raise  His skin is discolored from the knee to the toes but there is no abnormality that we can detect  Expect this to resolve continue the Tylenol return to activities as tolerated

## 2022-08-12 NOTE — Telephone Encounter (Signed)
Noted. Done.

## 2022-08-24 ENCOUNTER — Inpatient Hospital Stay: Payer: Medicare Other

## 2022-08-25 ENCOUNTER — Inpatient Hospital Stay: Payer: Medicare Other | Attending: Hematology

## 2022-08-25 DIAGNOSIS — E785 Hyperlipidemia, unspecified: Secondary | ICD-10-CM | POA: Diagnosis not present

## 2022-08-25 DIAGNOSIS — E119 Type 2 diabetes mellitus without complications: Secondary | ICD-10-CM | POA: Diagnosis not present

## 2022-08-25 DIAGNOSIS — I10 Essential (primary) hypertension: Secondary | ICD-10-CM | POA: Insufficient documentation

## 2022-08-25 DIAGNOSIS — E538 Deficiency of other specified B group vitamins: Secondary | ICD-10-CM | POA: Diagnosis not present

## 2022-08-25 DIAGNOSIS — G4730 Sleep apnea, unspecified: Secondary | ICD-10-CM | POA: Insufficient documentation

## 2022-08-25 DIAGNOSIS — D696 Thrombocytopenia, unspecified: Secondary | ICD-10-CM | POA: Diagnosis not present

## 2022-08-25 DIAGNOSIS — M129 Arthropathy, unspecified: Secondary | ICD-10-CM | POA: Diagnosis not present

## 2022-08-25 DIAGNOSIS — Z7984 Long term (current) use of oral hypoglycemic drugs: Secondary | ICD-10-CM | POA: Insufficient documentation

## 2022-08-25 DIAGNOSIS — Z79899 Other long term (current) drug therapy: Secondary | ICD-10-CM | POA: Insufficient documentation

## 2022-08-25 DIAGNOSIS — D693 Immune thrombocytopenic purpura: Secondary | ICD-10-CM

## 2022-08-25 DIAGNOSIS — E669 Obesity, unspecified: Secondary | ICD-10-CM | POA: Diagnosis not present

## 2022-08-25 LAB — CBC WITH DIFFERENTIAL/PLATELET
Abs Immature Granulocytes: 0.02 10*3/uL (ref 0.00–0.07)
Basophils Absolute: 0 10*3/uL (ref 0.0–0.1)
Basophils Relative: 1 %
Eosinophils Absolute: 0.2 10*3/uL (ref 0.0–0.5)
Eosinophils Relative: 4 %
HCT: 37.7 % — ABNORMAL LOW (ref 39.0–52.0)
Hemoglobin: 13.3 g/dL (ref 13.0–17.0)
Immature Granulocytes: 0 %
Lymphocytes Relative: 26 %
Lymphs Abs: 1.4 10*3/uL (ref 0.7–4.0)
MCH: 32.1 pg (ref 26.0–34.0)
MCHC: 35.3 g/dL (ref 30.0–36.0)
MCV: 91.1 fL (ref 80.0–100.0)
Monocytes Absolute: 0.4 10*3/uL (ref 0.1–1.0)
Monocytes Relative: 8 %
Neutro Abs: 3.3 10*3/uL (ref 1.7–7.7)
Neutrophils Relative %: 61 %
Platelets: 60 10*3/uL — ABNORMAL LOW (ref 150–400)
RBC: 4.14 MIL/uL — ABNORMAL LOW (ref 4.22–5.81)
RDW: 12.6 % (ref 11.5–15.5)
WBC: 5.4 10*3/uL (ref 4.0–10.5)
nRBC: 0 % (ref 0.0–0.2)

## 2022-08-27 DIAGNOSIS — E782 Mixed hyperlipidemia: Secondary | ICD-10-CM | POA: Diagnosis not present

## 2022-08-27 DIAGNOSIS — E1140 Type 2 diabetes mellitus with diabetic neuropathy, unspecified: Secondary | ICD-10-CM | POA: Diagnosis not present

## 2022-08-31 ENCOUNTER — Inpatient Hospital Stay (HOSPITAL_BASED_OUTPATIENT_CLINIC_OR_DEPARTMENT_OTHER): Payer: Medicare Other | Admitting: Hematology

## 2022-08-31 VITALS — BP 161/95 | HR 75 | Temp 97.7°F | Resp 18 | Ht 71.0 in | Wt 265.5 lb

## 2022-08-31 DIAGNOSIS — D693 Immune thrombocytopenic purpura: Secondary | ICD-10-CM

## 2022-08-31 DIAGNOSIS — E538 Deficiency of other specified B group vitamins: Secondary | ICD-10-CM

## 2022-08-31 DIAGNOSIS — E785 Hyperlipidemia, unspecified: Secondary | ICD-10-CM | POA: Diagnosis not present

## 2022-08-31 DIAGNOSIS — D696 Thrombocytopenia, unspecified: Secondary | ICD-10-CM | POA: Diagnosis not present

## 2022-08-31 DIAGNOSIS — I10 Essential (primary) hypertension: Secondary | ICD-10-CM | POA: Diagnosis not present

## 2022-08-31 DIAGNOSIS — E119 Type 2 diabetes mellitus without complications: Secondary | ICD-10-CM | POA: Diagnosis not present

## 2022-08-31 DIAGNOSIS — M129 Arthropathy, unspecified: Secondary | ICD-10-CM | POA: Diagnosis not present

## 2022-08-31 NOTE — Progress Notes (Signed)
[place], [place] [address]   CLINIC:  Medical Oncology/Hematology  PCP:  Celene Squibb, MD [address] [name] [address]  [phone]  REASON FOR VISIT:  Follow-up for immune-mediated thrombocytopenia  PRIOR THERAPY: none  CURRENT THERAPY: observation  INTERVAL HISTORY:  [name], a 81 y.o. male, seen for follow-up of immune mediated thrombocytopenia and B12 deficiency.  Denies any nosebleeds, hematuria, bleeding per rectum.  He has easy bruising of the upper extremity, mostly on the forearms.  No other bruising reported.  Denies any fevers, night sweats or weight loss.  REVIEW OF SYSTEMS:  Review of Systems  Constitutional:  Negative for appetite change.  Neurological:  Positive for dizziness.  All other systems reviewed and are negative.   PAST MEDICAL/SURGICAL HISTORY:  Past Medical History:  Diagnosis Date   Arthritis    Atypical chest pain    Low-risk exercise stress Cardiolite EF 60% April 2008. Reportedly normal remote cardiac catheterization @ [place] EF 60%...nuclear....2008   Bradycardia    hospital 03/2010 stopped beta blocker   Complication of anesthesia    Diabetes mellitus without complication ([place])    Dyslipidemia    Hypertension    Mild aortic regurgitation with LV dilation by prior echocardiogram    Neuropathy    in feet   Obesity    Obstructive sleep apnea    C-Pap compliant   PONV (postoperative nausea and vomiting)    Thrombocytopenia ([place])    mild follow Dr. 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    Procedure: LEFT TOTAL KNEE ARTHROPLASTY;  Surgeon: Carole Civil, MD;  Location: [facility];  Service: Orthopedics;  Laterality: Left;    SOCIAL HISTORY:  Social History   Socioeconomic History   Marital status: Married    Spouse name: Not on file   Number of children: 2   Years of education: Not on file   Highest education level: Some college, no degree  Occupational History   Occupation: retired  Tobacco Use   Smoking status: Never   Smokeless tobacco: Never  Vaping Use   Vaping Use: Never used  Substance and Sexual Activity   Alcohol use: Not Currently    Comment: wine on occasion   Drug use: No   Sexual activity: Not Currently  Other Topics Concern   Not on file  Social History Narrative   Lives at home with wife   Right handed   Caffeine: Diet coke roughly 12 oz in a day    Social Determinants of Health   Financial Resource Strain: Low Risk  (09/29/2020)   Overall Financial Resource Strain (CARDIA)    Difficulty of Paying Living Expenses: Not hard at all  Food Insecurity: No Food Insecurity (09/29/2020)   Hunger Vital Sign    Worried About Running Out of Food in the Last Year: Never true    [place] in the Last Year: Never true  Transportation Needs: No Transportation Needs (09/29/2020)   PRAPARE - Hydrologist (Medical): No  Lack of Transportation (Non-Medical): No  Physical Activity: Inactive (09/29/2020)   Exercise Vital Sign    Days of Exercise per Week: 0 days    Minutes of Exercise per Session: 0 min  Stress: No Stress Concern Present (09/29/2020)   [place]    Feeling of Stress : Not at all  Social Connections: Moderately Integrated (09/29/2020)   Social Connection and Isolation Panel [NHANES]    Frequency of Communication with Friends and Family: More than three times a week    Frequency of Social Gatherings with Friends and Family: Three times a week     Attends Religious Services: 1 to 4 times per year    Active Member of Clubs or Organizations: No    Attends Archivist Meetings: Never    Marital Status: Married  Human resources officer Violence: Not At Risk (09/29/2020)   Humiliation, Afraid, Rape, and Kick questionnaire    Fear of Current or Ex-Partner: No    Emotionally Abused: No    Physically Abused: No    Sexually Abused: No    FAMILY HISTORY:  Family History  Problem Relation Age of Onset   Heart attack Mother        fatal, cause of death   Heart attack Maternal Grandmother    Memory loss Neg Hx     CURRENT MEDICATIONS:  Current Outpatient Medications  Medication Sig Dispense Refill   acetaminophen (TYLENOL) 500 MG tablet Take 1,000 mg by mouth every 6 (six) hours as needed for mild pain or moderate pain.     amLODipine (NORVASC) 10 MG tablet Take 10 mg by mouth daily.      atorvastatin (LIPITOR) 20 MG tablet Take 20 mg by mouth daily.     B Complex Vitamins (VITAMIN B-COMPLEX) TABS Take 1 tablet by mouth daily.      doxycycline (VIBRAMYCIN) 100 MG capsule Take 100 mg by mouth in the morning and at bedtime.     fluticasone (FLONASE) 50 MCG/ACT nasal spray Place 2 sprays into both nostrils at bedtime.      gabapentin (NEURONTIN) 600 MG tablet Take 1,200 mg by mouth daily at 2 am.     glipiZIDE (GLUCOTROL XL) 5 MG 24 hr tablet Take 5 mg by mouth 2 (two) times daily.     GLUCOSAMINE-CHONDROITIN-MSM PO Take 1,500 mg by mouth daily.     Glucose Blood (GNP TRUE METRIX GLUCOSE STRIPS VI) USE THREE TIMES DAILY     LORazepam (ATIVAN) 0.5 MG tablet Take 0.5 mg by mouth daily as needed.     Melatonin 10 MG CAPS Take 10 mg by mouth at bedtime.      metFORMIN (GLUCOPHAGE) 500 MG tablet Take 500 mg by mouth 4 (four) times daily.      mupirocin ointment (BACTROBAN) 2 % Apply 1 application  topically 3 times/day as needed-between meals & bedtime.     olmesartan (BENICAR) 40 MG tablet Take 40 mg by mouth daily.      triamcinolone  cream (KENALOG) 0.5 %      vitamin B-12 (CYANOCOBALAMIN) 1000 MCG tablet Take 1,000 mcg by mouth daily.     No current facility-administered medications for this visit.    ALLERGIES:  Allergies  Allergen Reactions   Contrast Media [Iodinated Contrast Media] Other (See Comments)    Allergic to MRI contrast. Eyes swelling very badly    Penicillin G    Penicillins Other (See Comments)    dizziness  Gemtesa [Vibegron] Rash    Patient reports red spots on his arms and itching    PHYSICAL EXAM:  Performance status (ECOG): 1 - Symptomatic but completely ambulatory  Vitals:   08/31/22 1124  BP: (!) 161/95  Pulse: 75  Resp: 18  Temp: 97.7 F (36.5 C)  SpO2: 95%   Wt Readings from Last 3 Encounters:  08/31/22 265 lb 8 oz (120.4 kg)  08/12/22 270 lb (122.5 kg)  01/20/22 266 lb 12.8 oz (121 kg)   Physical Exam Vitals reviewed.  Constitutional:      Appearance: Normal appearance. He is obese.  Neurological:     General: No focal deficit present.     Mental Status: He is alert and oriented to person, place, and time.  Psychiatric:        Mood and Affect: Mood normal.        Behavior: Behavior normal.     LABORATORY DATA:  I have reviewed the labs as listed.     Latest Ref Rng & Units 08/25/2022   12:57 PM 01/15/2022   10:18 AM 07/02/2021   10:49 AM  CBC  WBC 4.0 - 10.5 K/uL 5.4  6.7  5.8   Hemoglobin 13.0 - 17.0 g/dL 13.3  14.1  14.7   Hematocrit 39.0 - 52.0 % 37.7  40.3  41.5   Platelets 150 - 400 K/uL 60  54  61       Latest Ref Rng & Units 01/15/2022   10:18 AM 07/02/2021   10:49 AM 10/07/2020   11:17 AM  CMP  Glucose 70 - 99 mg/dL 197  191  223   BUN 8 - 23 mg/dL _0 Creatinine 0.61 - 1.24 mg/dL 1.16  0.97  0.99   Sodium 135 - 145 mmol/L 133  134  137   Potassium 3.5 - 5.1 mmol/L 4.1  3.6  3.5   Chloride 98 - 111 mmol/L 102  104  101   CO2 22 - 32 mmol/L _1 Calcium 8.9 - 10.3 mg/dL 9.1  8.6  9.4   Total Protein 6.5 - 8.1 g/dL 7.1  7.3     Total Bilirubin 0.3 - 1.2 mg/dL 0.7  0.6    Alkaline Phos 38 - 126 U/L 58  57    AST 15 - 41 U/L 23  24    ALT 0 - 44 U/L 21  23        Component Value Date/Time   RBC 4.14 (L) 08/25/2022 1257   MCV 91.1 08/25/2022 1257   MCH 32.1 08/25/2022 1257   MCHC 35.3 08/25/2022 1257   RDW 12.6 08/25/2022 1257   LYMPHSABS 1.4 08/25/2022 1257   MONOABS 0.4 08/25/2022 1257   EOSABS 0.2 08/25/2022 1257   BASOSABS 0.0 08/25/2022 1257    DIAGNOSTIC IMAGING:  I have independently reviewed the scans and discussed with the patient. DG Knee AP/LAT W/[place] Left  Result Date: 08/12/2022 Left knee x-ray History of total knee Status post fall 2 days ago X-ray shows no fracture dislocation or loosening Impression normal left knee     ASSESSMENT:  1.  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  Bone marrow biopsy in 2009 was reportedly normal.  This was done in [place]. -CTAP showed fatty infiltration of the liver with normal spleen. -Work-up for nutritional deficiencies, plasma cell disorder was negative.  Infectious etiology was negative.  LDH was normal. -  Platelet count dropped to 29 on 01/26/2020 and was treated with dexamethasone for 4 days. -Dexamethasone made to him have epigastric pain, indigestion, burping and hiccups. -Peak platelet count reached 108 on 01/31/2020. -As he cannot tolerate dexamethasone, options include Promacta.  PLAN:  1.  Immune mediated thrombocytopenia: - Denies any nosebleeds, gum bleeds, bleeding per rectum or hematuria. - He has easy bruising of the forearms.  He is not on any antiplatelet agents. - Reviewed labs today which showed platelet count 60,000.  Hemoglobin and white count with differential was normal. - No indication for treatment at this time. - He is seeing Dr. [name] Crane every 3 to 4 months and he will have CBC checked there.  If there is any drop in platelet count below 50 K, he will contact us. - I will see him  back in 6 months for follow-up with repeat CBC.  Orders placed this encounter:  Orders Placed This Encounter  Procedures   CBC with Differential   Vitamin B12     Derek Jack, MD [place] [phone]

## 2022-08-31 NOTE — Patient Instructions (Addendum)
[place] at [facility] Discharge Instructions   You were seen and examined today by Dr. Delton Coombes.  He reviewed your platelet count which is 60,000 and stable.   We will see you back in 6 months. We will repeat labs at that time.    Thank you for choosing [place] at [facility] to provide your oncology and hematology care.  To afford each patient quality time with our provider, please arrive at least 15 minutes before your scheduled appointment time.   If you have a lab appointment with the [facility] please come in thru the Main Entrance and check in at the main information desk.  You need to re-schedule your appointment should you arrive 10 or more minutes late.  We strive to give you quality time with our providers, and arriving late affects you and other patients whose appointments are after yours.  Also, if you no show three or more times for appointments you may be dismissed from the clinic at the providers discretion.     Again, thank you for choosing [facility].  Our hope is that these requests will decrease the amount of time that you wait before being seen by our physicians.       _____________________________________________________________  Should you have questions after your visit to [facility], please contact our office at [phone] and follow the prompts.  Our office hours are 8:00 a.m. and 4:30 p.m. Monday - Friday.  Please note that voicemails left after 4:00 p.m. may not be returned until the following business day.  We are closed weekends and major holidays.  You do have access to a nurse 24-7, just call the main number to the clinic [phone] and do not press any options, hold on the line and a nurse will answer the phone.    For prescription refill requests, have your pharmacy contact our office and allow 72 hours.    Due to Covid, you will need to wear a mask upon entering the  hospital. If you do not have a mask, a mask will be given to you at the Main Entrance upon arrival. For doctor visits, patients may have 1 support person age 94 or older with them. For treatment visits, patients can not have anyone with them due to social distancing guidelines and our immunocompromised population.

## 2022-09-01 DIAGNOSIS — Z23 Encounter for immunization: Secondary | ICD-10-CM | POA: Diagnosis not present

## 2022-09-03 DIAGNOSIS — R972 Elevated prostate specific antigen [PSA]: Secondary | ICD-10-CM | POA: Diagnosis not present

## 2022-09-03 DIAGNOSIS — L853 Xerosis cutis: Secondary | ICD-10-CM | POA: Diagnosis not present

## 2022-09-03 DIAGNOSIS — G4733 Obstructive sleep apnea (adult) (pediatric): Secondary | ICD-10-CM | POA: Diagnosis not present

## 2022-09-03 DIAGNOSIS — E782 Mixed hyperlipidemia: Secondary | ICD-10-CM | POA: Diagnosis not present

## 2022-09-03 DIAGNOSIS — D696 Thrombocytopenia, unspecified: Secondary | ICD-10-CM | POA: Diagnosis not present

## 2022-09-03 DIAGNOSIS — R42 Dizziness and giddiness: Secondary | ICD-10-CM | POA: Diagnosis not present

## 2022-09-03 DIAGNOSIS — E1140 Type 2 diabetes mellitus with diabetic neuropathy, unspecified: Secondary | ICD-10-CM | POA: Diagnosis not present

## 2022-09-03 DIAGNOSIS — I129 Hypertensive chronic kidney disease with stage 1 through stage 4 chronic kidney disease, or unspecified chronic kidney disease: Secondary | ICD-10-CM | POA: Diagnosis not present

## 2022-09-03 DIAGNOSIS — G9009 Other idiopathic peripheral autonomic neuropathy: Secondary | ICD-10-CM | POA: Diagnosis not present

## 2022-09-03 DIAGNOSIS — R809 Proteinuria, unspecified: Secondary | ICD-10-CM | POA: Diagnosis not present

## 2022-09-03 DIAGNOSIS — R32 Unspecified urinary incontinence: Secondary | ICD-10-CM | POA: Diagnosis not present

## 2022-09-15 DIAGNOSIS — H52203 Unspecified astigmatism, bilateral: Secondary | ICD-10-CM | POA: Diagnosis not present

## 2022-09-15 DIAGNOSIS — H04123 Dry eye syndrome of bilateral lacrimal glands: Secondary | ICD-10-CM | POA: Diagnosis not present

## 2022-09-15 DIAGNOSIS — H43813 Vitreous degeneration, bilateral: Secondary | ICD-10-CM | POA: Diagnosis not present

## 2022-09-15 DIAGNOSIS — H524 Presbyopia: Secondary | ICD-10-CM | POA: Diagnosis not present

## 2022-09-15 DIAGNOSIS — E113393 Type 2 diabetes mellitus with moderate nonproliferative diabetic retinopathy without macular edema, bilateral: Secondary | ICD-10-CM | POA: Diagnosis not present

## 2022-12-23 ENCOUNTER — Encounter: Payer: Self-pay | Admitting: Radiology

## 2022-12-30 DIAGNOSIS — E782 Mixed hyperlipidemia: Secondary | ICD-10-CM | POA: Diagnosis not present

## 2022-12-30 DIAGNOSIS — E1140 Type 2 diabetes mellitus with diabetic neuropathy, unspecified: Secondary | ICD-10-CM | POA: Diagnosis not present

## 2023-01-05 DIAGNOSIS — D696 Thrombocytopenia, unspecified: Secondary | ICD-10-CM | POA: Diagnosis not present

## 2023-01-05 DIAGNOSIS — G4733 Obstructive sleep apnea (adult) (pediatric): Secondary | ICD-10-CM | POA: Diagnosis not present

## 2023-01-05 DIAGNOSIS — E1142 Type 2 diabetes mellitus with diabetic polyneuropathy: Secondary | ICD-10-CM | POA: Diagnosis not present

## 2023-01-05 DIAGNOSIS — M79645 Pain in left finger(s): Secondary | ICD-10-CM | POA: Diagnosis not present

## 2023-01-05 DIAGNOSIS — F5104 Psychophysiologic insomnia: Secondary | ICD-10-CM | POA: Diagnosis not present

## 2023-01-05 DIAGNOSIS — I129 Hypertensive chronic kidney disease with stage 1 through stage 4 chronic kidney disease, or unspecified chronic kidney disease: Secondary | ICD-10-CM | POA: Diagnosis not present

## 2023-01-05 DIAGNOSIS — I872 Venous insufficiency (chronic) (peripheral): Secondary | ICD-10-CM | POA: Diagnosis not present

## 2023-01-05 DIAGNOSIS — E782 Mixed hyperlipidemia: Secondary | ICD-10-CM | POA: Diagnosis not present

## 2023-01-05 DIAGNOSIS — N182 Chronic kidney disease, stage 2 (mild): Secondary | ICD-10-CM | POA: Diagnosis not present

## 2023-01-05 DIAGNOSIS — E1140 Type 2 diabetes mellitus with diabetic neuropathy, unspecified: Secondary | ICD-10-CM | POA: Diagnosis not present

## 2023-01-05 DIAGNOSIS — L853 Xerosis cutis: Secondary | ICD-10-CM | POA: Diagnosis not present

## 2023-01-05 DIAGNOSIS — R5383 Other fatigue: Secondary | ICD-10-CM | POA: Diagnosis not present

## 2023-01-05 DIAGNOSIS — G9009 Other idiopathic peripheral autonomic neuropathy: Secondary | ICD-10-CM | POA: Diagnosis not present

## 2023-03-03 DIAGNOSIS — Z6835 Body mass index (BMI) 35.0-35.9, adult: Secondary | ICD-10-CM | POA: Diagnosis not present

## 2023-03-03 DIAGNOSIS — E118 Type 2 diabetes mellitus with unspecified complications: Secondary | ICD-10-CM | POA: Diagnosis not present

## 2023-03-03 DIAGNOSIS — E1140 Type 2 diabetes mellitus with diabetic neuropathy, unspecified: Secondary | ICD-10-CM | POA: Diagnosis not present

## 2023-03-03 DIAGNOSIS — I10 Essential (primary) hypertension: Secondary | ICD-10-CM | POA: Diagnosis not present

## 2023-03-03 DIAGNOSIS — R42 Dizziness and giddiness: Secondary | ICD-10-CM | POA: Diagnosis not present

## 2023-03-03 DIAGNOSIS — Z713 Dietary counseling and surveillance: Secondary | ICD-10-CM | POA: Diagnosis not present

## 2023-03-03 DIAGNOSIS — Z7984 Long term (current) use of oral hypoglycemic drugs: Secondary | ICD-10-CM | POA: Diagnosis not present

## 2023-03-08 ENCOUNTER — Inpatient Hospital Stay: Payer: Medicare Other | Attending: Hematology

## 2023-03-08 DIAGNOSIS — D693 Immune thrombocytopenic purpura: Secondary | ICD-10-CM | POA: Insufficient documentation

## 2023-03-08 DIAGNOSIS — E538 Deficiency of other specified B group vitamins: Secondary | ICD-10-CM

## 2023-03-08 DIAGNOSIS — Z79899 Other long term (current) drug therapy: Secondary | ICD-10-CM | POA: Insufficient documentation

## 2023-03-08 DIAGNOSIS — I10 Essential (primary) hypertension: Secondary | ICD-10-CM | POA: Diagnosis not present

## 2023-03-08 LAB — CBC WITH DIFFERENTIAL/PLATELET
Abs Immature Granulocytes: 0.02 10*3/uL (ref 0.00–0.07)
Basophils Absolute: 0 10*3/uL (ref 0.0–0.1)
Basophils Relative: 1 %
Eosinophils Absolute: 0.4 10*3/uL (ref 0.0–0.5)
Eosinophils Relative: 7 %
HCT: 38.4 % — ABNORMAL LOW (ref 39.0–52.0)
Hemoglobin: 13.8 g/dL (ref 13.0–17.0)
Immature Granulocytes: 0 %
Lymphocytes Relative: 37 %
Lymphs Abs: 2.2 10*3/uL (ref 0.7–4.0)
MCH: 32.9 pg (ref 26.0–34.0)
MCHC: 35.9 g/dL (ref 30.0–36.0)
MCV: 91.4 fL (ref 80.0–100.0)
Monocytes Absolute: 0.4 10*3/uL (ref 0.1–1.0)
Monocytes Relative: 7 %
Neutro Abs: 2.8 10*3/uL (ref 1.7–7.7)
Neutrophils Relative %: 48 %
Platelets: 38 10*3/uL — ABNORMAL LOW (ref 150–400)
RBC: 4.2 MIL/uL — ABNORMAL LOW (ref 4.22–5.81)
RDW: 12.4 % (ref 11.5–15.5)
WBC: 5.9 10*3/uL (ref 4.0–10.5)
nRBC: 0 % (ref 0.0–0.2)

## 2023-03-08 LAB — VITAMIN B12: Vitamin B-12: 258 pg/mL (ref 180–914)

## 2023-03-09 DIAGNOSIS — I10 Essential (primary) hypertension: Secondary | ICD-10-CM | POA: Diagnosis not present

## 2023-03-15 ENCOUNTER — Inpatient Hospital Stay: Payer: Medicare Other | Admitting: Hematology

## 2023-03-21 NOTE — Progress Notes (Signed)
[facility] [address] [name] [address]    Clinic Day:  03/21/2023  Referring physician: Benita Stabile, MD  Patient Care Team: Benita Stabile, MD as PCP - General (General Practice)   ASSESSMENT & PLAN:   Assessment: 1.  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  Bone marrow biopsy in 2009 was reportedly normal.  This was done in [place]. -CTAP showed fatty infiltration of the liver with normal spleen. -Work-up for nutritional deficiencies, plasma cell disorder was negative.  Infectious etiology was negative.  LDH was normal. -Platelet count dropped to 29 on 01/26/2020 and was treated with dexamethasone for 4 days. -Dexamethasone made to him have epigastric pain, indigestion, burping and hiccups. -Peak platelet count reached 108 on 01/31/2020. -As he cannot tolerate dexamethasone, options include Promacta.    Plan: 1.  Immune mediated thrombocytopenia: - He denies any nosebleeds, gum bleeds, bleeding per rectum or hematuria. - He does have easy bruising on the forearms which is stable. - Labs from 03/08/2023: PLT decreased to 38, down from 60.  Hemoglobin and white count and differential were normal.  B12 was normal at 258. - We discussed that he needs some kind of treatment to improve his platelet count.  We discussed options including fostamatinib and TPO mimetics.  Because of his poorly controlled hypertension on 3 different medications, I did not choose fostamatinib. - We discussed Promacta starting at 25 mg daily and titrate up as needed. - We discussed GI side effects and other side effects in detail. - RTC 2 weeks after starting Promacta with repeat labs.  2.  Hypertension: - Continue Norvasc 10 mg daily, Benicar 40 mg daily and HCTZ 12.5 mg daily.  Blood pressure today is 154/88.    No orders of the defined types were placed in this encounter.     I,Katie Daubenspeck,acting as a Neurosurgeon for Doreatha Massed, MD.,have documented all relevant documentation on the behalf of Doreatha Massed, MD,as directed by  Doreatha Massed, MD while in the presence of Doreatha Massed, MD.   I, Doreatha Massed MD, have reviewed the above documentation for accuracy and completeness, and I agree with the above.   Mickie Bail   5/27/20249:27 PM  CHIEF COMPLAINT:   Diagnosis: immune-mediated thrombocytopenia   Cancer Staging  No matching staging information was found for the patient.   Prior Therapy: none  Current Therapy:  observation   HISTORY OF PRESENT ILLNESS:   Oncology History   No history exists.     INTERVAL HISTORY:   Brian [name] is a 82 y.o. male presenting to clinic today for follow up of immune-mediated thrombocytopenia. He was last seen by me on 08/31/22.  Today, he states that he is doing well overall. His appetite level is at 100%. His energy level is at 40%.  PAST MEDICAL HISTORY:   Past Medical History: Past Medical History:  Diagnosis Date   Arthritis    Atypical chest pain    Low-risk exercise stress Cardiolite EF 60% April 2008. Reportedly normal remote cardiac catheterization @ [facility] EF 60%...nuclear....2008   Bradycardia    hospital 03/2010 stopped beta blocker   Complication of anesthesia    Diabetes mellitus without complication (HCC)    Dyslipidemia    Hypertension    Mild aortic regurgitation with LV dilation by prior echocardiogram    Neuropathy    in feet   Obesity    Obstructive sleep apnea    C-Pap compliant  PONV (postoperative nausea and vomiting)    Thrombocytopenia (HCC)    mild follow Dr. Barnie Mort   [facility] 03/2010    Surgical History: Past Surgical History:  Procedure Laterality Date   CARDIAC CATHETERIZATION     KNEE ARTHROSCOPY WITH MEDIAL MENISECTOMY Left 05/31/2014   Procedure: LEFT KNEE ARTHROSCOPY WITH MEDIAL MENISECTOMY;  Surgeon: Vickki Hearing, MD;  Location: [facility];  Service: Orthopedics;   Laterality: Left;   Status post bilateral arthroscopic knee surgery     TOTAL KNEE ARTHROPLASTY Left 04/14/2016   Procedure: LEFT TOTAL KNEE ARTHROPLASTY;  Surgeon: Vickki Hearing, MD;  Location: [facility];  Service: Orthopedics;  Laterality: Left;    Social History: Social History   Socioeconomic History   Marital status: Married    Spouse name: Not on file   Number of children: 2   Years of education: Not on file   Highest education level: Some college, no degree  Occupational History   Occupation: retired  Tobacco Use   Smoking status: Never   Smokeless tobacco: Never  Vaping Use   Vaping Use: Never used  Substance and Sexual Activity   Alcohol use: Not Currently    Comment: wine on occasion   Drug use: No   Sexual activity: Not Currently  Other Topics Concern   Not on file  Social History Narrative   Lives at home with wife   Right handed   Caffeine: Diet coke roughly 12 oz in a day    Social Determinants of Health   Financial Resource Strain: Low Risk  (09/29/2020)   Overall Financial Resource Strain (CARDIA)    Difficulty of Paying Living Expenses: Not hard at all  Food Insecurity: No Food Insecurity (09/29/2020)   Hunger Vital Sign    Worried About Running Out of Food in the Last Year: Never true    Ran Out of Food in the Last Year: Never true  Transportation Needs: No Transportation Needs (09/29/2020)   PRAPARE - Administrator, Civil Service (Medical): No    Lack of Transportation (Non-Medical): No  Physical Activity: Inactive (09/29/2020)   Exercise Vital Sign    Days of Exercise per Week: 0 days    Minutes of Exercise per Session: 0 min  Stress: No Stress Concern Present (09/29/2020)   [employer] of Occupational Health - Occupational Stress Questionnaire    Feeling of Stress : Not at all  Social Connections: Moderately Integrated (09/29/2020)   Social Connection and Isolation Panel [NHANES]    Frequency of Communication with Friends and  Family: More than three times a week    Frequency of Social Gatherings with Friends and Family: Three times a week    Attends Religious Services: 1 to 4 times per year    Active Member of Clubs or Organizations: No    Attends Banker Meetings: Never    Marital Status: Married  Catering manager Violence: Not At Risk (09/29/2020)   Humiliation, Afraid, Rape, and Kick questionnaire    Fear of Current or Ex-Partner: No    Emotionally Abused: No    Physically Abused: No    Sexually Abused: No    Family History: Family History  Problem Relation Age of Onset   Heart attack Mother        fatal, cause of death   Heart attack Maternal Grandmother    Memory loss Neg Hx     Current Medications:  Current Outpatient Medications:  acetaminophen (TYLENOL) 500 MG tablet, Take 1,000 mg by mouth every 6 (six) hours as needed for mild pain or moderate pain., Disp: , Rfl:    amLODipine (NORVASC) 10 MG tablet, Take 10 mg by mouth daily. , Disp: , Rfl:    atorvastatin (LIPITOR) 20 MG tablet, Take 20 mg by mouth daily., Disp: , Rfl:    B Complex Vitamins (VITAMIN B-COMPLEX) TABS, Take 1 tablet by mouth daily. , Disp: , Rfl:    doxycycline (VIBRAMYCIN) 100 MG capsule, Take 100 mg by mouth in the morning and at bedtime., Disp: , Rfl:    fluticasone (FLONASE) 50 MCG/ACT nasal spray, Place 2 sprays into both nostrils at bedtime. , Disp: , Rfl:    gabapentin (NEURONTIN) 600 MG tablet, Take 1,200 mg by mouth daily at 2 am., Disp: , Rfl:    glipiZIDE (GLUCOTROL XL) 5 MG 24 hr tablet, Take 5 mg by mouth 2 (two) times daily., Disp: , Rfl:    GLUCOSAMINE-CHONDROITIN-MSM PO, Take 1,500 mg by mouth daily., Disp: , Rfl:    Glucose Blood (GNP TRUE METRIX GLUCOSE STRIPS VI), USE THREE TIMES DAILY, Disp: , Rfl:    LORazepam (ATIVAN) 0.5 MG tablet, Take 0.5 mg by mouth daily as needed., Disp: , Rfl:    Melatonin 10 MG CAPS, Take 10 mg by mouth at bedtime. , Disp: , Rfl:    metFORMIN (GLUCOPHAGE) 500 MG  tablet, Take 500 mg by mouth 4 (four) times daily. , Disp: , Rfl:    mupirocin ointment (BACTROBAN) 2 %, Apply 1 application  topically 3 times/day as needed-between meals & bedtime., Disp: , Rfl:    olmesartan (BENICAR) 40 MG tablet, Take 40 mg by mouth daily. , Disp: , Rfl:    triamcinolone cream (KENALOG) 0.5 %, , Disp: , Rfl:    vitamin B-12 (CYANOCOBALAMIN) 1000 MCG tablet, Take 1,000 mcg by mouth daily., Disp: , Rfl:    Allergies: Allergies  Allergen Reactions   Contrast Media [Iodinated Contrast Media] Other (See Comments)    Allergic to MRI contrast. Eyes swelling very badly    Penicillin G    Penicillins Other (See Comments)    dizziness   Gemtesa [Vibegron] Rash    Patient reports red spots on his arms and itching    REVIEW OF SYSTEMS:   Review of Systems  Constitutional:  Negative for chills, fatigue and fever.  HENT:   Negative for lump/mass, mouth sores, nosebleeds, sore throat and trouble swallowing.   Eyes:  Negative for eye problems.  Respiratory:  Negative for cough and shortness of breath.   Cardiovascular:  Negative for chest pain, leg swelling and palpitations.  Gastrointestinal:  Positive for nausea. Negative for abdominal pain, constipation, diarrhea and vomiting.  Genitourinary:  Negative for bladder incontinence, difficulty urinating, dysuria, frequency, hematuria and nocturia.   Musculoskeletal:  Negative for arthralgias, back pain, flank pain, myalgias and neck pain.  Skin:  Negative for itching and rash.  Neurological:  Positive for dizziness. Negative for headaches and numbness.  Hematological:  Does not bruise/bleed easily.  Psychiatric/Behavioral:  Positive for sleep disturbance. Negative for depression and suicidal ideas. The patient is not nervous/anxious.   All other systems reviewed and are negative.    VITALS:   There were no vitals taken for this visit.  Wt Readings from Last 3 Encounters:  08/31/22 265 lb 8 oz (120.4 kg)  08/12/22 270 lb  (122.5 kg)  01/20/22 266 lb 12.8 oz (121 kg)    There is no  height or weight on file to calculate BMI.  Performance status (ECOG): 1 - Symptomatic but completely ambulatory  PHYSICAL EXAM:   Physical Exam Vitals and nursing note reviewed. Exam conducted with a chaperone present.  Constitutional:      Appearance: Normal appearance.  Cardiovascular:     Rate and Rhythm: Normal rate and regular rhythm.     Pulses: Normal pulses.     Heart sounds: Normal heart sounds.  Pulmonary:     Effort: Pulmonary effort is normal.     Breath sounds: Normal breath sounds.  Abdominal:     Palpations: Abdomen is soft. There is no hepatomegaly, splenomegaly or mass.     Tenderness: There is no abdominal tenderness.  Musculoskeletal:     Right lower leg: No edema.     Left lower leg: No edema.  Lymphadenopathy:     Cervical: No cervical adenopathy.     Right cervical: No superficial, deep or posterior cervical adenopathy.    Left cervical: No superficial, deep or posterior cervical adenopathy.     Upper Body:     Right upper body: No supraclavicular or axillary adenopathy.     Left upper body: No supraclavicular or axillary adenopathy.  Neurological:     General: No focal deficit present.     Mental Status: He is alert and oriented to person, place, and time.  Psychiatric:        Mood and Affect: Mood normal.        Behavior: Behavior normal.     LABS:      Latest Ref Rng & Units 03/08/2023   10:53 AM 08/25/2022   12:57 PM 01/15/2022   10:18 AM  CBC  WBC 4.0 - 10.5 K/uL 5.9  5.4  6.7   Hemoglobin 13.0 - 17.0 g/dL 16.1  09.6  04.5   Hematocrit 39.0 - 52.0 % 38.4  37.7  40.3   Platelets 150 - 400 K/uL 38  60  54       Latest Ref Rng & Units 01/15/2022   10:18 AM 07/02/2021   10:49 AM 10/07/2020   11:17 AM  CMP  Glucose 70 - 99 mg/dL 409  811  914   BUN 8 - 23 mg/dL 21  20  17    Creatinine 0.61 - 1.24 mg/dL 7.82  9.56  2.13   Sodium 135 - 145 mmol/L 133  134  137   Potassium 3.5 -  5.1 mmol/L 4.1  3.6  3.5   Chloride 98 - 111 mmol/L 102  104  101   CO2 22 - 32 mmol/L 21  22  20    Calcium 8.9 - 10.3 mg/dL 9.1  8.6  9.4   Total Protein 6.5 - 8.1 g/dL 7.1  7.3    Total Bilirubin 0.3 - 1.2 mg/dL 0.7  0.6    Alkaline Phos 38 - 126 U/L 58  57    AST 15 - 41 U/L 23  24    ALT 0 - 44 U/L 21  23       No results found for: "CEA1", "CEA" / No results found for: "CEA1", "CEA" No results found for: "PSA1" No results found for: "CAN199" No results found for: "CAN125"  Lab Results  Component Value Date   TOTALPROTELP 6.9 12/04/2018   ALBUMINELP 3.9 12/04/2018   A1GS 0.2 12/04/2018   A2GS 0.7 12/04/2018   BETS 1.0 12/04/2018   GAMS 1.1 12/04/2018   MSPIKE Not Observed 12/04/2018   SPEI Comment  12/04/2018   No results found for: "TIBC", "FERRITIN", "IRONPCTSAT" Lab Results  Component Value Date   LDH 137 01/15/2022   LDH 147 07/02/2021   LDH 140 09/26/2020     STUDIES:   No results found.

## 2023-03-22 ENCOUNTER — Telehealth: Payer: Self-pay | Admitting: Pharmacist

## 2023-03-22 ENCOUNTER — Other Ambulatory Visit (HOSPITAL_COMMUNITY): Payer: Self-pay

## 2023-03-22 ENCOUNTER — Other Ambulatory Visit: Payer: Self-pay

## 2023-03-22 ENCOUNTER — Inpatient Hospital Stay (HOSPITAL_BASED_OUTPATIENT_CLINIC_OR_DEPARTMENT_OTHER): Payer: Medicare Other | Admitting: Hematology

## 2023-03-22 VITALS — BP 154/88 | HR 78 | Temp 97.4°F | Resp 18 | Ht 71.0 in | Wt 254.2 lb

## 2023-03-22 DIAGNOSIS — Z79899 Other long term (current) drug therapy: Secondary | ICD-10-CM | POA: Diagnosis not present

## 2023-03-22 DIAGNOSIS — I10 Essential (primary) hypertension: Secondary | ICD-10-CM | POA: Diagnosis not present

## 2023-03-22 DIAGNOSIS — D693 Immune thrombocytopenic purpura: Secondary | ICD-10-CM

## 2023-03-22 MED ORDER — ELTROMBOPAG OLAMINE 25 MG PO TABS
25.0000 mg | ORAL_TABLET | Freq: Every day | ORAL | 1 refills | Status: DC
  Filled 2023-03-22: qty 30, 30d supply, fill #0

## 2023-03-22 NOTE — Patient Instructions (Addendum)
[facility] at [facility] Discharge Instructions   You were seen and examined today by Dr. Ellin Saba.  He reviewed the results of your lab work. Your platelets have dropped to 38,000.   He discussed starting you on a medication to help get your platelet count up. This medication is called Promacta. This drug can be started at a low dose and titrated up until we reach the desired platelet count. This medication will come from a specialty pharmacy that will be delivered to your home. Expect calls from the pharmacist and pharmacy tech from [facility] to receive education on this drug and to arrange for delivery to your home.   Return as scheduled.   Thank you for choosing  Cancer Center at [facility] to provide your oncology and hematology care.  To afford each patient quality time with our provider, please arrive at least 15 minutes before your scheduled appointment time.   If you have a lab appointment with the Cancer Center please come in thru the Main Entrance and check in at the main information desk.  You need to re-schedule your appointment should you arrive 10 or more minutes late.  We strive to give you quality time with our providers, and arriving late affects you and other patients whose appointments are after yours.  Also, if you no show three or more times for appointments you may be dismissed from the clinic at the providers discretion.     Again, thank you for choosing [facility].  Our hope is that these requests will decrease the amount of time that you wait before being seen by our physicians.       _____________________________________________________________  Should you have questions after your visit to [facility], please contact our office at [phone] and follow the prompts.  Our office hours are 8:00 a.m. and 4:30 p.m. Monday - Friday.  Please note that voicemails left after 4:00  p.m. may not be returned until the following business day.  We are closed weekends and major holidays.  You do have access to a nurse 24-7, just call the main number to the clinic [phone] and do not press any options, hold on the line and a nurse will answer the phone.    For prescription refill requests, have your pharmacy contact our office and allow 72 hours.    Due to Covid, you will need to wear a mask upon entering the hospital. If you do not have a mask, a mask will be given to you at the Main Entrance upon arrival. For doctor visits, patients may have 1 support person age 41 or older with them. For treatment visits, patients can not have anyone with them due to social distancing guidelines and our immunocompromised population.

## 2023-03-22 NOTE — Telephone Encounter (Signed)
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  No recent CMP, but MD ordered for future monitoring. Patient does not appear to have a history of hepatic impairment. Prescription dose and frequency assessed.   Current medication list in Epic reviewed, one DDIs with eltrombopag identified: Atorvastatin: Eltrombopag may increase the serum concentration atorvastatin. Monitor patient closely for atorvastatin adverse effects, muscle aches/pain and darkening of urine. No baseline dose adjustment needed  Evaluated chart and no patient barriers to medication adherence identified.   Prescription has been e-scribed to the [facility] for benefits analysis and approval.  Oral Oncology Clinic will continue to follow for insurance authorization, copayment issues, initial counseling and start date.   Remi Haggard, PharmD, BCPS, BCOP, CPP Hematology/Oncology Clinical Pharmacist Practitioner [place]/DB/AP Oral Chemotherapy Navigation Clinic [phone]  03/22/2023 3:58 PM

## 2023-03-23 ENCOUNTER — Other Ambulatory Visit (HOSPITAL_COMMUNITY): Payer: Self-pay

## 2023-03-23 ENCOUNTER — Telehealth: Payer: Self-pay

## 2023-03-23 NOTE — Telephone Encounter (Addendum)
Oral Oncology Patient Advocate Encounter  Prior Authorization for [name] has been approved.    PA# [id number]  Effective dates: 03/22/23 through 06/23/23  Patients co-pay is $2,010.98.   PAP initiated. See additional encounter.    Ardeen Fillers, [name] Oncology Pharmacy Patient Advocate  [facility]  [phone] (phone) [phone] (fax) 03/23/2023 10:28 AM

## 2023-03-23 NOTE — Telephone Encounter (Signed)
Oral Oncology Patient Advocate Encounter   Submitted application for assistance for Promacta to [employer].   Application submitted via e-fax to [phone]   PANO/ NPAF's phone number [phone].   I will continue to check the status until final determination.    Ardeen Fillers, [name] Oncology Pharmacy Patient Advocate  [facility]  [phone] (phone) [phone] (fax) 03/23/2023 4:04 PM

## 2023-03-23 NOTE — Telephone Encounter (Signed)
Oral Oncology Patient Advocate Encounter   Began application for assistance for Promacta through [employer].   Application will be submitted upon completion of necessary supporting documentation.   PANO/ NPAF's phone number [phone].   I will continue to check the status until final determination.    Ardeen Fillers, [name] Oncology Pharmacy Patient Advocate  [facility]  [phone] (phone) [phone] (fax) 03/23/2023 11:17 AM

## 2023-03-23 NOTE — Telephone Encounter (Signed)
Oral Oncology Patient Advocate Encounter  New authorization   Received notification that prior authorization for Promacta is required.   PA submitted on 03/23/23  Key [id number]  Status is pending     Ardeen Fillers, [name] Oncology Pharmacy Patient Advocate  [facility]  [phone] (phone) [phone] (fax) 03/23/2023 8:26 AM

## 2023-03-24 NOTE — Telephone Encounter (Signed)
Received notification from [employer]/ NPAF that application was received and Benefits Investigation had been started. I will continue to follow and update until final determination.    Ardeen Fillers, [name] Oncology Pharmacy Patient Advocate  [facility]  [phone] (phone) [phone] (fax) 03/24/2023 2:26 PM

## 2023-03-29 DIAGNOSIS — Z961 Presence of intraocular lens: Secondary | ICD-10-CM | POA: Diagnosis not present

## 2023-03-29 DIAGNOSIS — E113393 Type 2 diabetes mellitus with moderate nonproliferative diabetic retinopathy without macular edema, bilateral: Secondary | ICD-10-CM | POA: Diagnosis not present

## 2023-03-29 NOTE — Telephone Encounter (Signed)
Called to check status of Benefits Investigation. Informed by representative that Benefits Investigation was completed and was being reviewed by Case Manager before being referred to [employer] ([employer]PAF) from [employer] [facility]). NPAF will require Proof of Income and patient will drop that off at MD office on Wednesday, 03/30/23. I will submit to NPAF once received and have received notification that application has been referred to NPAF. I will continue to follow and update until final determination.    Ardeen Fillers, [name] Oncology Pharmacy Patient Advocate  [facility]  [phone] (phone) [phone] (fax) 03/29/2023 10:03 AM

## 2023-03-30 NOTE — Telephone Encounter (Signed)
Oral Oncology Patient Advocate Encounter   Received notification that the application for assistance for Promacta through [employer] has been approved.   NPAF's phone number [phone].   Effective dates: 03/30/23 through 10/25/23   Ardeen Fillers, [name] Oncology Pharmacy Patient Advocate  [facility]  [phone] (phone) [phone] (fax) 03/30/2023 4:01 PM

## 2023-03-30 NOTE — Telephone Encounter (Signed)
Proof of Income was sent to me yesterday, 03/29/23, and was submitted via e-fax to [employer] at [phone]. I called and confirmed that proof of income was received and case manager is actively reviewing to refer to [employer] for final processing. I will continue to follow and update until final determination.    Ardeen Fillers, [name] Oncology Pharmacy Patient Advocate  [facility]  [phone] (phone) [phone] (fax) 03/30/2023 10:00 AM

## 2023-03-31 NOTE — Telephone Encounter (Signed)
Left VM for patient to call back to go over PAP approval and next steps.   1st attempt - 03/31/23   Ardeen Fillers, [name] Oncology Pharmacy Patient Advocate  [facility]  [phone] (phone) [phone] (fax) 03/31/2023 9:17 AM

## 2023-04-04 NOTE — Telephone Encounter (Signed)
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   Counseled patient on administration, dosing, side effects, monitoring, drug-food interactions, safe handling, storage, and disposal. Patient will take 1 tablet (25 mg total) by mouth daily. Take on an empty stomach, 1 hour before a meal or 2 hours after.  Side effects include but not limited to: nausea and diarrhea.    Reviewed with patient importance of keeping a medication schedule and plan for any missed doses.  After discussion with patient on patient barriers to medication adherence identified.   Mr. [name] voiced understanding and appreciation. All questions answered. Medication handout provided.  Provided patient with Oral Chemotherapy Navigation Clinic phone number. Patient knows to call the office with questions or concerns. Oral Chemotherapy Navigation Clinic will continue to follow.  Remi Haggard, PharmD, BCPS, BCOP, CPP Hematology/Oncology Clinical Pharmacist Practitioner [place]/DB/AP Oral Chemotherapy Navigation Clinic [phone]  04/04/2023 1:27 PM

## 2023-04-04 NOTE — Telephone Encounter (Signed)
Spoke to patient and informed them of approval for [employer] for [place]. Patient knows to call NPAF at [phone] to initiate first fill of medication. Patient was provided drug education by pharmacist. Patient also knows to call me at [phone] with any questions or concerns regarding receiving medication from PAP.    Ardeen Fillers, [name] Oncology Pharmacy Patient Advocate  [facility]  [phone] (phone) [phone] (fax) 04/04/2023 1:31 PM

## 2023-04-12 ENCOUNTER — Inpatient Hospital Stay: Payer: Medicare Other

## 2023-04-13 ENCOUNTER — Inpatient Hospital Stay: Payer: Medicare Other | Admitting: Hematology

## 2023-05-02 ENCOUNTER — Encounter: Payer: Self-pay | Admitting: Orthopedic Surgery

## 2023-05-02 ENCOUNTER — Other Ambulatory Visit (INDEPENDENT_AMBULATORY_CARE_PROVIDER_SITE_OTHER): Payer: Medicare Other

## 2023-05-02 ENCOUNTER — Ambulatory Visit (INDEPENDENT_AMBULATORY_CARE_PROVIDER_SITE_OTHER): Payer: Medicare Other | Admitting: Orthopedic Surgery

## 2023-05-02 VITALS — Ht 71.0 in | Wt 258.0 lb

## 2023-05-02 DIAGNOSIS — S8002XA Contusion of left knee, initial encounter: Secondary | ICD-10-CM | POA: Diagnosis not present

## 2023-05-02 DIAGNOSIS — Z96652 Presence of left artificial knee joint: Secondary | ICD-10-CM

## 2023-05-02 DIAGNOSIS — G8929 Other chronic pain: Secondary | ICD-10-CM | POA: Diagnosis not present

## 2023-05-02 DIAGNOSIS — M25562 Pain in left knee: Secondary | ICD-10-CM | POA: Diagnosis not present

## 2023-05-02 NOTE — Progress Notes (Signed)
Chief Complaint  Patient presents with   Knee Pain    Fu left knee  he fell on Saturday June 6  knee is purple and bruised    Encounter Diagnoses  Name Primary?   Chronic pain of left knee Yes   S/P total knee replacement, left    April 14, 2016 this is the 7-year annual follow-up  This is a Sigma posterior stabilized total knee  There is no sign of loosening on its look to be appropriate size and alignment looks to be normal  He did fall on his right knee secondary to his neuropathy and he tripped over a garden hose so he has a bruise on his knee around the patella  However he has full range of motion and a stable knee no signs of quadriceps injury or patellar injury  X-ray looks good at 7 years   Encounter Diagnoses  Name Primary?   Chronic pain of left knee Yes   S/P total knee replacement, left    Contusion of left knee, initial encounter     We decided to have him follow-up as needed

## 2023-05-03 ENCOUNTER — Inpatient Hospital Stay: Payer: Medicare Other | Attending: Hematology

## 2023-05-03 DIAGNOSIS — Z7984 Long term (current) use of oral hypoglycemic drugs: Secondary | ICD-10-CM | POA: Diagnosis not present

## 2023-05-03 DIAGNOSIS — R7989 Other specified abnormal findings of blood chemistry: Secondary | ICD-10-CM | POA: Diagnosis not present

## 2023-05-03 DIAGNOSIS — K760 Fatty (change of) liver, not elsewhere classified: Secondary | ICD-10-CM | POA: Diagnosis not present

## 2023-05-03 DIAGNOSIS — G4700 Insomnia, unspecified: Secondary | ICD-10-CM | POA: Diagnosis not present

## 2023-05-03 DIAGNOSIS — E1140 Type 2 diabetes mellitus with diabetic neuropathy, unspecified: Secondary | ICD-10-CM | POA: Diagnosis not present

## 2023-05-03 DIAGNOSIS — I10 Essential (primary) hypertension: Secondary | ICD-10-CM | POA: Diagnosis not present

## 2023-05-03 DIAGNOSIS — G4733 Obstructive sleep apnea (adult) (pediatric): Secondary | ICD-10-CM | POA: Insufficient documentation

## 2023-05-03 DIAGNOSIS — K30 Functional dyspepsia: Secondary | ICD-10-CM | POA: Diagnosis not present

## 2023-05-03 DIAGNOSIS — R42 Dizziness and giddiness: Secondary | ICD-10-CM | POA: Insufficient documentation

## 2023-05-03 DIAGNOSIS — E785 Hyperlipidemia, unspecified: Secondary | ICD-10-CM | POA: Insufficient documentation

## 2023-05-03 DIAGNOSIS — R066 Hiccough: Secondary | ICD-10-CM | POA: Diagnosis not present

## 2023-05-03 DIAGNOSIS — Z79899 Other long term (current) drug therapy: Secondary | ICD-10-CM | POA: Insufficient documentation

## 2023-05-03 DIAGNOSIS — D693 Immune thrombocytopenic purpura: Secondary | ICD-10-CM | POA: Diagnosis not present

## 2023-05-03 LAB — CBC WITH DIFFERENTIAL/PLATELET
Abs Immature Granulocytes: 0.02 10*3/uL (ref 0.00–0.07)
Basophils Absolute: 0 10*3/uL (ref 0.0–0.1)
Basophils Relative: 1 %
Eosinophils Absolute: 0.5 10*3/uL (ref 0.0–0.5)
Eosinophils Relative: 8 %
HCT: 37.9 % — ABNORMAL LOW (ref 39.0–52.0)
Hemoglobin: 13.3 g/dL (ref 13.0–17.0)
Immature Granulocytes: 0 %
Lymphocytes Relative: 35 %
Lymphs Abs: 2.1 10*3/uL (ref 0.7–4.0)
MCH: 33.3 pg (ref 26.0–34.0)
MCHC: 35.1 g/dL (ref 30.0–36.0)
MCV: 94.8 fL (ref 80.0–100.0)
Monocytes Absolute: 0.5 10*3/uL (ref 0.1–1.0)
Monocytes Relative: 8 %
Neutro Abs: 2.9 10*3/uL (ref 1.7–7.7)
Neutrophils Relative %: 48 %
Platelets: 96 10*3/uL — ABNORMAL LOW (ref 150–400)
RBC: 4 MIL/uL — ABNORMAL LOW (ref 4.22–5.81)
RDW: 12.6 % (ref 11.5–15.5)
WBC: 6 10*3/uL (ref 4.0–10.5)
nRBC: 0 % (ref 0.0–0.2)

## 2023-05-03 LAB — COMPREHENSIVE METABOLIC PANEL
ALT: 19 U/L (ref 0–44)
AST: 21 U/L (ref 15–41)
Albumin: 3.9 g/dL (ref 3.5–5.0)
Alkaline Phosphatase: 53 U/L (ref 38–126)
Anion gap: 10 (ref 5–15)
BUN: 20 mg/dL (ref 8–23)
CO2: 21 mmol/L — ABNORMAL LOW (ref 22–32)
Calcium: 8.9 mg/dL (ref 8.9–10.3)
Chloride: 102 mmol/L (ref 98–111)
Creatinine, Ser: 1.2 mg/dL (ref 0.61–1.24)
GFR, Estimated: >60 mL/min (ref >60)
Glucose, Bld: 217 mg/dL — ABNORMAL HIGH (ref 70–99)
Potassium: 4.1 mmol/L (ref 3.5–5.1)
Sodium: 133 mmol/L — ABNORMAL LOW (ref 135–145)
Total Bilirubin: 0.7 mg/dL (ref 0.3–1.2)
Total Protein: 7.2 g/dL (ref 6.5–8.1)

## 2023-05-03 LAB — LACTATE DEHYDROGENASE: LDH: 156 U/L (ref 98–192)

## 2023-05-03 NOTE — Progress Notes (Signed)
[facility] [address] [name] [address]    Clinic Day:  05/03/2023  Referring physician: Benita Stabile, MD  Patient Care Team: Benita Stabile, MD as PCP - General (General Practice)   ASSESSMENT & PLAN:   Assessment: 1.  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  Bone marrow biopsy in 2009 was reportedly normal.  This was done in [place]. -CTAP showed fatty infiltration of the liver with normal spleen. -Work-up for nutritional deficiencies, plasma cell disorder was negative.  Infectious etiology was negative.  LDH was normal. -Platelet count dropped to 29 on 01/26/2020 and was treated with dexamethasone for 4 days. -Dexamethasone made to him have epigastric pain, indigestion, burping and hiccups. -Peak platelet count reached 108 on 01/31/2020. -As he cannot tolerate dexamethasone, options include Promacta.    Plan: 1.  Immune mediated thrombocytopenia: - He denies any nosebleeds, gum bleeds, bleeding per rectum or hematuria. - He does have easy bruising on the forearms which is stable. - Labs from 03/08/2023: PLT decreased to 38, down from 60.  Hemoglobin and white count and differential were normal.  B12 was normal at 258. - We discussed that he needs some kind of treatment to improve his platelet count.  We discussed options including fostamatinib and TPO mimetics.  Because of his poorly controlled hypertension on 3 different medications, I did not choose fostamatinib. - We discussed Promacta starting at 25 mg daily and titrate up as needed. - We discussed GI side effects and other side effects in detail. - RTC 2 weeks after starting Promacta with repeat labs.  2.  Hypertension: - Continue Norvasc 10 mg daily, Benicar 40 mg daily and HCTZ 12.5 mg daily.  Blood pressure today is 154/88.    No orders of the defined types were placed in this encounter.    [name],acting as a Neurosurgeon for Doreatha Massed, MD.,have documented all relevant documentation on the behalf of Doreatha Massed, MD,as directed by  Doreatha Massed, MD while in the presence of Doreatha Massed, MD.  ***   [place] R Teague   7/9/20249:32 PM  CHIEF COMPLAINT:   Diagnosis: immune-mediated thrombocytopenia    Cancer Staging  No matching staging information was found for the patient.   Prior Therapy: none  Current Therapy:  observation   HISTORY OF PRESENT ILLNESS:   Oncology History   No history exists.     INTERVAL HISTORY:   Brian [name] is a 82 y.o. male presenting to clinic today for follow up of immune-mediated thrombocytopenia. He was last seen by me on 03/22/23.  Today, he states that he is doing well overall. His appetite level is at ***%. His energy level is at ***%.  PAST MEDICAL HISTORY:   Past Medical History: Past Medical History:  Diagnosis Date   Arthritis    Atypical chest pain    Low-risk exercise stress Cardiolite EF 60% April 2008. Reportedly normal remote cardiac catheterization @ [facility] EF 60%...nuclear....2008   Bradycardia    hospital 03/2010 stopped beta blocker   Complication of anesthesia    Diabetes mellitus without complication (HCC)    Dyslipidemia    Hypertension    Mild aortic regurgitation with LV dilation by prior echocardiogram    Neuropathy    in feet   Obesity    Obstructive sleep apnea    C-Pap compliant   PONV (postoperative nausea and vomiting)    Thrombocytopenia (HCC)    mild follow Dr.  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   Procedure:  LEFT TOTAL KNEE ARTHROPLASTY;  Surgeon: Vickki Hearing, MD;  Location: [facility];  Service: Orthopedics;  Laterality: Left;    Social History: Social History   Socioeconomic History   Marital status: Married    Spouse name: Not on file   Number of children: 2   Years of education: Not on file   Highest education level: Some college, no degree  Occupational History   Occupation: retired  Tobacco Use   Smoking status: Never   Smokeless tobacco: Never  Vaping Use   Vaping Use: Never used  Substance and Sexual Activity   Alcohol use: Not Currently    Comment: wine on occasion   Drug use: No   Sexual activity: Not Currently  Other Topics Concern   Not on file  Social History Narrative   Lives at home with wife   Right handed   Caffeine: Diet coke roughly 12 oz in a day    Social Determinants of Health   Financial Resource Strain: Low Risk  (09/29/2020)   Overall Financial Resource Strain (CARDIA)    Difficulty of Paying Living Expenses: Not hard at all  Food Insecurity: No Food Insecurity (09/29/2020)   Hunger Vital Sign    Worried About Running Out of Food in the Last Year: Never true    Ran Out of Food in the Last Year: Never true  Transportation Needs: No Transportation Needs (09/29/2020)   PRAPARE - Administrator, Civil Service (Medical): No    Lack of Transportation (Non-Medical): No  Physical Activity: Inactive (09/29/2020)   Exercise Vital Sign    Days of Exercise per Week: 0 days    Minutes of Exercise per Session: 0 min  Stress: No Stress Concern Present (09/29/2020)   [employer] of Occupational Health - Occupational Stress Questionnaire    Feeling of Stress : Not at all  Social Connections: Moderately Integrated (09/29/2020)   Social Connection and Isolation Panel [NHANES]    Frequency of Communication with Friends and Family: More than three times a week    Frequency of Social Gatherings with Friends and Family: Three times a week    Attends  Religious Services: 1 to 4 times per year    Active Member of Clubs or Organizations: No    Attends Banker Meetings: Never    Marital Status: Married  Catering manager Violence: Not At Risk (09/29/2020)   Humiliation, Afraid, Rape, and Kick questionnaire    Fear of Current or Ex-Partner: No    Emotionally Abused: No    Physically Abused: No    Sexually Abused: No    Family History: Family History  Problem Relation Age of Onset   Heart attack Mother        fatal, cause of death   Heart attack Maternal Grandmother    Memory loss Neg Hx     Current Medications:  Current Outpatient Medications:    acetaminophen (TYLENOL) 500 MG tablet, Take 1,000 mg by mouth every 6 (six) hours as needed  for mild pain or moderate pain., Disp: , Rfl:    amLODipine (NORVASC) 10 MG tablet, Take 10 mg by mouth daily. , Disp: , Rfl:    atorvastatin (LIPITOR) 20 MG tablet, Take 20 mg by mouth daily., Disp: , Rfl:    B Complex Vitamins (VITAMIN B-COMPLEX) TABS, Take 1 tablet by mouth daily. , Disp: , Rfl:    doxycycline (VIBRAMYCIN) 100 MG capsule, Take 100 mg by mouth in the morning and at bedtime., Disp: , Rfl:    eltrombopag (PROMACTA) 25 MG tablet, Take 1 tablet (25 mg total) by mouth daily. Take on an empty stomach, 1 hour before a meal or 2 hours after., Disp: 30 tablet, Rfl: 1   fluticasone (FLONASE) 50 MCG/ACT nasal spray, Place 2 sprays into both nostrils at bedtime. , Disp: , Rfl:    gabapentin (NEURONTIN) 600 MG tablet, Take 1,200 mg by mouth daily at 2 am., Disp: , Rfl:    glipiZIDE (GLUCOTROL XL) 5 MG 24 hr tablet, Take 10 mg by mouth 2 (two) times daily., Disp: , Rfl:    GLUCOSAMINE-CHONDROITIN-MSM PO, Take 1,500 mg by mouth daily., Disp: , Rfl:    Glucose Blood (GNP TRUE METRIX GLUCOSE STRIPS VI), USE THREE TIMES DAILY, Disp: , Rfl:    hydrochlorothiazide (MICROZIDE) 12.5 MG capsule, TAKE 1 CAPSULE BY MOUTH EVERY DAY IN THE MORNING FOR 90 DAYS, Disp: , Rfl:    LORazepam (ATIVAN)  0.5 MG tablet, Take 0.5 mg by mouth daily as needed., Disp: , Rfl:    meclizine (ANTIVERT) 25 MG tablet, TAKE 1/2 TO 1 TABLET BY MOUTH EVERY 8 HOURS AS NEEDED FOR DIZZINESS/VERTIGO, Disp: , Rfl:    Melatonin 10 MG CAPS, Take 10 mg by mouth at bedtime. , Disp: , Rfl:    metFORMIN (GLUCOPHAGE) 500 MG tablet, Take 500 mg by mouth 4 (four) times daily. , Disp: , Rfl:    mupirocin ointment (BACTROBAN) 2 %, Apply 1 application  topically 3 times/day as needed-between meals & bedtime., Disp: , Rfl:    olmesartan (BENICAR) 40 MG tablet, Take 40 mg by mouth daily. , Disp: , Rfl:    triamcinolone cream (KENALOG) 0.5 %, , Disp: , Rfl:    vitamin B-12 (CYANOCOBALAMIN) 1000 MCG tablet, Take 1,000 mcg by mouth daily., Disp: , Rfl:    Allergies: Allergies  Allergen Reactions   Contrast Media [Iodinated Contrast Media] Other (See Comments)    Allergic to MRI contrast. Eyes swelling very badly    Penicillin G    Penicillins Other (See Comments)    dizziness   Gemtesa [Vibegron] Rash    Patient reports red spots on his arms and itching    REVIEW OF SYSTEMS:   Review of Systems  Constitutional:  Negative for chills, fatigue and fever.  HENT:   Negative for lump/mass, mouth sores, nosebleeds, sore throat and trouble swallowing.   Eyes:  Negative for eye problems.  Respiratory:  Negative for cough and shortness of breath.   Cardiovascular:  Negative for chest pain, leg swelling and palpitations.  Gastrointestinal:  Negative for abdominal pain, constipation, diarrhea, nausea and vomiting.  Genitourinary:  Negative for bladder incontinence, difficulty urinating, dysuria, frequency, hematuria and nocturia.   Musculoskeletal:  Negative for arthralgias, back pain, flank pain, myalgias and neck pain.  Skin:  Negative for itching and rash.  Neurological:  Negative for dizziness, headaches and numbness.  Hematological:  Does not bruise/bleed easily.  Psychiatric/Behavioral:  Negative for depression, sleep  disturbance and suicidal ideas. The  patient is not nervous/anxious.   All other systems reviewed and are negative.    VITALS:   There were no vitals taken for this visit.  Wt Readings from Last 3 Encounters:  05/02/23 258 lb (117 kg)  03/22/23 254 lb 3.2 oz (115.3 kg)  08/31/22 265 lb 8 oz (120.4 kg)    There is no height or weight on file to calculate BMI.  Performance status (ECOG): 1 - Symptomatic but completely ambulatory  PHYSICAL EXAM:   Physical Exam Vitals and nursing note reviewed. Exam conducted with a chaperone present.  Constitutional:      Appearance: Normal appearance.  Cardiovascular:     Rate and Rhythm: Normal rate and regular rhythm.     Pulses: Normal pulses.     Heart sounds: Normal heart sounds.  Pulmonary:     Effort: Pulmonary effort is normal.     Breath sounds: Normal breath sounds.  Abdominal:     Palpations: Abdomen is soft. There is no hepatomegaly, splenomegaly or mass.     Tenderness: There is no abdominal tenderness.  Musculoskeletal:     Right lower leg: No edema.     Left lower leg: No edema.  Lymphadenopathy:     Cervical: No cervical adenopathy.     Right cervical: No superficial, deep or posterior cervical adenopathy.    Left cervical: No superficial, deep or posterior cervical adenopathy.     Upper Body:     Right upper body: No supraclavicular or axillary adenopathy.     Left upper body: No supraclavicular or axillary adenopathy.  Neurological:     General: No focal deficit present.     Mental Status: He is alert and oriented to person, place, and time.  Psychiatric:        Mood and Affect: Mood normal.        Behavior: Behavior normal.     LABS:      Latest Ref Rng & Units 05/03/2023   10:01 AM 03/08/2023   10:53 AM 08/25/2022   12:57 PM  CBC  WBC 4.0 - 10.5 K/uL 6.0  5.9  5.4   Hemoglobin 13.0 - 17.0 g/dL 40.9  81.1  91.4   Hematocrit 39.0 - 52.0 % 37.9  38.4  37.7   Platelets 150 - 400 K/uL 96  38  60       Latest  Ref Rng & Units 05/03/2023   10:01 AM 01/15/2022   10:18 AM 07/02/2021   10:49 AM  CMP  Glucose 70 - 99 mg/dL 782  956  213   BUN 8 - 23 mg/dL 20  21  20    Creatinine 0.61 - 1.24 mg/dL 0.86  5.78  4.69   Sodium 135 - 145 mmol/L 133  133  134   Potassium 3.5 - 5.1 mmol/L 4.1  4.1  3.6   Chloride 98 - 111 mmol/L 102  102  104   CO2 22 - 32 mmol/L 21  21  22    Calcium 8.9 - 10.3 mg/dL 8.9  9.1  8.6   Total Protein 6.5 - 8.1 g/dL 7.2  7.1  7.3   Total Bilirubin 0.3 - 1.2 mg/dL 0.7  0.7  0.6   Alkaline Phos 38 - 126 U/L 53  58  57   AST 15 - 41 U/L 21  23  24    ALT 0 - 44 U/L 19  21  23       No results found for: "CEA1", "CEA" / No  results found for: "CEA1", "CEA" No results found for: "PSA1" No results found for: "CAN199" No results found for: "CAN125"  Lab Results  Component Value Date   TOTALPROTELP 6.9 12/04/2018   ALBUMINELP 3.9 12/04/2018   A1GS 0.2 12/04/2018   A2GS 0.7 12/04/2018   BETS 1.0 12/04/2018   GAMS 1.1 12/04/2018   MSPIKE Not Observed 12/04/2018   SPEI Comment 12/04/2018   No results found for: "TIBC", "FERRITIN", "IRONPCTSAT" Lab Results  Component Value Date   LDH 156 05/03/2023   LDH 137 01/15/2022   LDH 147 07/02/2021     STUDIES:   DG Knee AP/LAT W/Sunrise Left  Result Date: 05/02/2023 X-rays.  Left total knee arthroplasty April 14, 2016 this is the 7-year annual follow-up This is a Sigma posterior stabilized total knee There is no sign of loosening on its look to be appropriate size and alignment looks to be normal Stable postop total knee at 7 years

## 2023-05-04 ENCOUNTER — Inpatient Hospital Stay: Payer: Medicare Other | Admitting: Hematology

## 2023-05-04 ENCOUNTER — Other Ambulatory Visit (HOSPITAL_COMMUNITY): Payer: Self-pay

## 2023-05-04 ENCOUNTER — Other Ambulatory Visit: Payer: Self-pay | Admitting: Pharmacist

## 2023-05-04 DIAGNOSIS — R7989 Other specified abnormal findings of blood chemistry: Secondary | ICD-10-CM | POA: Diagnosis not present

## 2023-05-04 DIAGNOSIS — Z79899 Other long term (current) drug therapy: Secondary | ICD-10-CM | POA: Diagnosis not present

## 2023-05-04 DIAGNOSIS — K760 Fatty (change of) liver, not elsewhere classified: Secondary | ICD-10-CM | POA: Diagnosis not present

## 2023-05-04 DIAGNOSIS — D693 Immune thrombocytopenic purpura: Secondary | ICD-10-CM

## 2023-05-04 DIAGNOSIS — R066 Hiccough: Secondary | ICD-10-CM | POA: Diagnosis not present

## 2023-05-04 DIAGNOSIS — K30 Functional dyspepsia: Secondary | ICD-10-CM | POA: Diagnosis not present

## 2023-05-04 MED ORDER — ELTROMBOPAG OLAMINE 25 MG PO TABS
25.0000 mg | ORAL_TABLET | Freq: Every day | ORAL | 3 refills | Status: DC
Start: 2023-05-04 — End: 2023-05-04
  Filled 2023-05-04: qty 90, 90d supply, fill #0

## 2023-05-04 MED ORDER — ELTROMBOPAG OLAMINE 25 MG PO TABS
25.0000 mg | ORAL_TABLET | Freq: Every day | ORAL | 3 refills | Status: DC
Start: 2023-05-04 — End: 2024-05-14

## 2023-05-04 NOTE — Patient Instructions (Signed)
[facility]  Discharge Instructions  You were seen and examined today by Dr. Ellin Saba.  Dr. Ellin Saba discussed your most recent lab work which revealed that everything looks good and that your platelets have improved.  Continue taking the Promacta as prescribed.  Follow-up as scheduled.    Thank you for choosing [facility] to provide your oncology and hematology care.   To afford each patient quality time with our provider, please arrive at least 15 minutes before your scheduled appointment time. You may need to reschedule your appointment if you arrive late (10 or more minutes). Arriving late affects you and other patients whose appointments are after yours.  Also, if you miss three or more appointments without notifying the office, you may be dismissed from the clinic at the provider's discretion.    Again, thank you for choosing [facility].  Our hope is that these requests will decrease the amount of time that you wait before being seen by our physicians.   If you have a lab appointment with the Cancer Center - please note that after April 8th, all labs will be drawn in the cancer center.  You do not have to check in or register with the main entrance as you have in the past but will complete your check-in at the cancer center.            _____________________________________________________________  Should you have questions after your visit to [facility], please contact our office at [phone] and follow the prompts.  Our office hours are 8:00 a.m. to 4:30 p.m. Monday - Thursday and 8:00 a.m. to 2:30 p.m. Friday.  Please note that voicemails left after 4:00 p.m. may not be returned until the following business day.  We are closed weekends and all major holidays.  You do have access to a nurse 24-7, just call the main number to the clinic [phone] and do not press any options, hold on the line  and a nurse will answer the phone.    For prescription refill requests, have your pharmacy contact our office and allow 72 hours.    Masks are no longer required in the cancer centers. If you would like for your care team to wear a mask while they are taking care of you, please let them know. You may have one support person who is at least 80 years old accompany you for your appointments.

## 2023-05-19 ENCOUNTER — Inpatient Hospital Stay (HOSPITAL_BASED_OUTPATIENT_CLINIC_OR_DEPARTMENT_OTHER): Payer: Medicare Other | Admitting: Hematology

## 2023-05-19 ENCOUNTER — Inpatient Hospital Stay: Payer: Medicare Other

## 2023-05-19 VITALS — BP 163/90 | HR 72 | Temp 97.8°F | Resp 16 | Wt 257.5 lb

## 2023-05-19 DIAGNOSIS — K760 Fatty (change of) liver, not elsewhere classified: Secondary | ICD-10-CM | POA: Diagnosis not present

## 2023-05-19 DIAGNOSIS — K30 Functional dyspepsia: Secondary | ICD-10-CM | POA: Diagnosis not present

## 2023-05-19 DIAGNOSIS — R066 Hiccough: Secondary | ICD-10-CM | POA: Diagnosis not present

## 2023-05-19 DIAGNOSIS — R7989 Other specified abnormal findings of blood chemistry: Secondary | ICD-10-CM | POA: Diagnosis not present

## 2023-05-19 DIAGNOSIS — D693 Immune thrombocytopenic purpura: Secondary | ICD-10-CM

## 2023-05-19 DIAGNOSIS — Z79899 Other long term (current) drug therapy: Secondary | ICD-10-CM | POA: Diagnosis not present

## 2023-05-19 LAB — COMPREHENSIVE METABOLIC PANEL
ALT: 18 U/L (ref 0–44)
AST: 18 U/L (ref 15–41)
Albumin: 3.8 g/dL (ref 3.5–5.0)
Alkaline Phosphatase: 59 U/L (ref 38–126)
Anion gap: 9 (ref 5–15)
BUN: 23 mg/dL (ref 8–23)
CO2: 20 mmol/L — ABNORMAL LOW (ref 22–32)
Calcium: 9 mg/dL (ref 8.9–10.3)
Chloride: 102 mmol/L (ref 98–111)
Creatinine, Ser: 1.28 mg/dL — ABNORMAL HIGH (ref 0.61–1.24)
GFR, Estimated: 57 mL/min — ABNORMAL LOW (ref >60)
Glucose, Bld: 265 mg/dL — ABNORMAL HIGH (ref 70–99)
Potassium: 4.1 mmol/L (ref 3.5–5.1)
Sodium: 131 mmol/L — ABNORMAL LOW (ref 135–145)
Total Bilirubin: 0.8 mg/dL (ref 0.3–1.2)
Total Protein: 7.4 g/dL (ref 6.5–8.1)

## 2023-05-19 LAB — CBC WITH DIFFERENTIAL/PLATELET
Abs Immature Granulocytes: 0.02 10*3/uL (ref 0.00–0.07)
Basophils Absolute: 0 10*3/uL (ref 0.0–0.1)
Basophils Relative: 1 %
Eosinophils Absolute: 0.6 10*3/uL — ABNORMAL HIGH (ref 0.0–0.5)
Eosinophils Relative: 12 %
HCT: 38.2 % — ABNORMAL LOW (ref 39.0–52.0)
Hemoglobin: 13.7 g/dL (ref 13.0–17.0)
Immature Granulocytes: 0 %
Lymphocytes Relative: 35 %
Lymphs Abs: 1.8 10*3/uL (ref 0.7–4.0)
MCH: 33.6 pg (ref 26.0–34.0)
MCHC: 35.9 g/dL (ref 30.0–36.0)
MCV: 93.6 fL (ref 80.0–100.0)
Monocytes Absolute: 0.3 10*3/uL (ref 0.1–1.0)
Monocytes Relative: 6 %
Neutro Abs: 2.4 10*3/uL (ref 1.7–7.7)
Neutrophils Relative %: 46 %
Platelets: 106 10*3/uL — ABNORMAL LOW (ref 150–400)
RBC: 4.08 MIL/uL — ABNORMAL LOW (ref 4.22–5.81)
RDW: 12.5 % (ref 11.5–15.5)
WBC: 5.2 10*3/uL (ref 4.0–10.5)
nRBC: 0 % (ref 0.0–0.2)

## 2023-05-19 NOTE — Progress Notes (Signed)
[facility] [address], [name] [address]    Clinic Day:  05/19/23   Referring physician: Benita Stabile, MD  Patient Care Team: Benita Stabile, MD as PCP - General (General Practice)   ASSESSMENT & PLAN:   Assessment: 1.  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  Bone marrow biopsy in 2009 was reportedly normal.  This was done in [place]. -CTAP showed fatty infiltration of the liver with normal spleen. -Work-up for nutritional deficiencies, plasma cell disorder was negative.  Infectious etiology was negative.  LDH was normal. -Platelet count dropped to 29 on 01/26/2020 and was treated with dexamethasone for 4 days. -Dexamethasone made to him have epigastric pain, indigestion, burping and hiccups. -Peak platelet count reached 108 on 01/31/2020. -As he cannot tolerate dexamethasone, options include Promacta. - Promacta 25 mg daily started on 04/20/2023.    Plan: 1.  Immune mediated thrombocytopenia: - Promacta 25 mg daily started on 04/20/2023. - Denies any major GI side effects. - Reviewed labs today: Normal LFTs.  Creatinine elevated at 1.28.  I reviewed the drug monograph which did not indicate that it causes elevated creatinine.  His blood sugar was elevated at 265 but he did not take his morning insulin.  CBC shows platelet count increased to 106.  Hemoglobin is stable at 13.7 and white count normal. - Recommend continuing Promacta 25 mg daily.  Will consider dose reduction if platelet count more than 200 K. - Recommend follow-up in 4 weeks with repeat labs.  2.  Hypertension: - Continue Norvasc 10 mg daily, Benicar 40 mg daily and HCTZ 12.5 mg daily.  Blood pressure today is 163/90.  He reports that he has not taken blood pressure medication this morning.    Orders Placed This Encounter  Procedures   CBC with Differential/Platelet    Standing Status:   Future    Standing Expiration Date:   05/18/2024     Order Specific Question:   Release to patient    Answer:   Immediate   Comprehensive metabolic panel    Standing Status:   Future    Standing Expiration Date:   05/18/2024    Order Specific Question:   Release to patient    Answer:   Immediate      I,Helena R Teague,acting as a scribe for Doreatha Massed, MD.,have documented all relevant documentation on the behalf of Doreatha Massed, MD,as directed by  Doreatha Massed, MD while in the presence of Doreatha Massed, MD.  I, Doreatha Massed MD, have reviewed the above documentation for accuracy and completeness, and I agree with the above.     Doreatha Massed, MD   7/25/20243:58 PM  CHIEF COMPLAINT:   Diagnosis: immune-mediated thrombocytopenia    Cancer Staging  No matching staging information was found for the patient.    Prior Therapy: none  Current Therapy:  observation   HISTORY OF PRESENT ILLNESS:   Oncology History   No history exists.     INTERVAL HISTORY:   Brian [name] is a 82 y.o. male presenting to clinic today for follow up of immune-mediated thrombocytopenia. He was last seen by me on 05/04/23.  Today, he states that he is doing well overall. His appetite level is at 100%. His energy level is at 40%.  PAST MEDICAL HISTORY:   Past Medical History: Past Medical History:  Diagnosis Date   Arthritis    Atypical chest pain    Low-risk exercise stress Cardiolite EF  60% April 2008. Reportedly normal remote cardiac catheterization @ [facility] EF 60%...nuclear....2008   Bradycardia    hospital 03/2010 stopped beta blocker   Complication of anesthesia    Diabetes mellitus without complication (HCC)    Dyslipidemia    Hypertension    Mild aortic regurgitation with LV dilation by prior echocardiogram    Neuropathy    in feet   Obesity    Obstructive sleep apnea    C-Pap compliant   PONV (postoperative nausea and vomiting)    Thrombocytopenia (HCC)    mild follow Dr. 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   Procedure: LEFT TOTAL KNEE ARTHROPLASTY;  Surgeon: Vickki Hearing, MD;  Location: [facility];  Service: Orthopedics;  Laterality: Left;    Social History: Social History   Socioeconomic History   Marital status: Married    Spouse name: Not on file   Number of children: 2   Years of education: Not on file   Highest education level: Some college, no degree  Occupational History   Occupation: retired  Tobacco Use   Smoking status: Never   Smokeless tobacco: Never  Vaping Use   Vaping status: Never Used  Substance and Sexual Activity   Alcohol use: Not Currently    Comment: wine on occasion   Drug use: No   Sexual activity: Not Currently  Other Topics Concern   Not on file  Social History Narrative   Lives at home with wife   Right handed   Caffeine: Diet coke roughly 12 oz in a day    Social Determinants of Health   Financial Resource Strain: Low Risk  (09/29/2020)   Overall Financial Resource Strain (CARDIA)    Difficulty of Paying Living Expenses: Not hard at all  Food Insecurity: No Food Insecurity (09/29/2020)   Hunger Vital Sign    Worried About Running Out of Food in the Last Year: Never true    Ran Out of Food in the Last Year: Never true  Transportation Needs: No Transportation Needs (09/29/2020)   PRAPARE - Administrator, Civil Service (Medical): No    Lack of Transportation (Non-Medical): No  Physical Activity: Inactive (09/29/2020)   Exercise Vital Sign    Days of Exercise per Week: 0 days    Minutes of Exercise per Session: 0  min  Stress: No Stress Concern Present (09/29/2020)   [employer] of Occupational Health - Occupational Stress Questionnaire    Feeling of Stress : Not at all  Social Connections: Moderately Integrated (09/29/2020)   Social Connection and Isolation Panel [NHANES]    Frequency of Communication with Friends and Family: More than three times a week    Frequency of Social Gatherings with Friends and Family: Three times a week    Attends Religious Services: 1 to 4 times per year    Active Member of Clubs or Organizations: No    Attends Banker Meetings: Never    Marital Status: Married  Catering manager Violence: Not At Risk (09/29/2020)   Humiliation, Afraid, Rape, and Kick questionnaire  Fear of Current or Ex-Partner: No    Emotionally Abused: No    Physically Abused: No    Sexually Abused: No    Family History: Family History  Problem Relation Age of Onset   Heart attack Mother        fatal, cause of death   Heart attack Maternal Grandmother    Memory loss Neg Hx     Current Medications:  Current Outpatient Medications:    acetaminophen (TYLENOL) 500 MG tablet, Take 1,000 mg by mouth every 6 (six) hours as needed for mild pain or moderate pain., Disp: , Rfl:    amLODipine (NORVASC) 10 MG tablet, Take 10 mg by mouth daily. , Disp: , Rfl:    atorvastatin (LIPITOR) 20 MG tablet, Take 20 mg by mouth daily., Disp: , Rfl:    B Complex Vitamins (VITAMIN B-COMPLEX) TABS, Take 1 tablet by mouth daily. , Disp: , Rfl:    doxycycline (VIBRAMYCIN) 100 MG capsule, Take 100 mg by mouth in the morning and at bedtime., Disp: , Rfl:    eltrombopag (PROMACTA) 25 MG tablet, Take 1 tablet (25 mg total) by mouth daily. Take on an empty stomach, 1 hour before a meal or 2 hours after., Disp: 90 tablet, Rfl: 3   fluticasone (FLONASE) 50 MCG/ACT nasal spray, Place 2 sprays into both nostrils at bedtime. , Disp: , Rfl:    gabapentin (NEURONTIN) 600 MG tablet, Take 1,200 mg by mouth daily  at 2 am., Disp: , Rfl:    glipiZIDE (GLUCOTROL XL) 10 MG 24 hr tablet, Take 10 mg by mouth 2 (two) times daily., Disp: , Rfl:    GLUCOSAMINE-CHONDROITIN-MSM PO, Take 1,500 mg by mouth daily., Disp: , Rfl:    Glucose Blood (GNP TRUE METRIX GLUCOSE STRIPS VI), USE THREE TIMES DAILY, Disp: , Rfl:    hydrochlorothiazide (MICROZIDE) 12.5 MG capsule, TAKE 1 CAPSULE BY MOUTH EVERY DAY IN THE MORNING FOR 90 DAYS, Disp: , Rfl:    insulin glargine, 2 Unit Dial, (TOUJEO MAX SOLOSTAR) 300 UNIT/ML Solostar Pen, , Disp: , Rfl:    LORazepam (ATIVAN) 0.5 MG tablet, Take 0.5 mg by mouth daily as needed., Disp: , Rfl:    meclizine (ANTIVERT) 25 MG tablet, TAKE 1/2 TO 1 TABLET BY MOUTH EVERY 8 HOURS AS NEEDED FOR DIZZINESS/VERTIGO, Disp: , Rfl:    Melatonin 10 MG CAPS, Take 10 mg by mouth at bedtime. , Disp: , Rfl:    metFORMIN (GLUCOPHAGE) 500 MG tablet, Take 500 mg by mouth 4 (four) times daily. , Disp: , Rfl:    mupirocin ointment (BACTROBAN) 2 %, Apply 1 application  topically 3 times/day as needed-between meals & bedtime., Disp: , Rfl:    olmesartan (BENICAR) 40 MG tablet, Take 40 mg by mouth daily. , Disp: , Rfl:    triamcinolone cream (KENALOG) 0.5 %, , Disp: , Rfl:    vitamin B-12 (CYANOCOBALAMIN) 1000 MCG tablet, Take 1,000 mcg by mouth daily., Disp: , Rfl:    Allergies: Allergies  Allergen Reactions   Contrast Media [Iodinated Contrast Media] Other (See Comments)    Allergic to MRI contrast. Eyes swelling very badly    Penicillin G    Penicillins Other (See Comments)    dizziness   Gemtesa [Vibegron] Rash    Patient reports red spots on his arms and itching    REVIEW OF SYSTEMS:   Review of Systems  Constitutional:  Negative for chills, fatigue and fever.  HENT:   Negative for lump/mass, mouth sores,  nosebleeds, sore throat and trouble swallowing.   Eyes:  Negative for eye problems.  Respiratory:  Negative for cough and shortness of breath.   Cardiovascular:  Negative for chest pain, leg  swelling and palpitations.  Gastrointestinal:  Positive for diarrhea. Negative for abdominal pain, constipation, nausea and vomiting.  Genitourinary:  Negative for bladder incontinence, difficulty urinating, dysuria, frequency, hematuria and nocturia.   Musculoskeletal:  Negative for arthralgias, back pain, flank pain, myalgias and neck pain.  Skin:  Negative for itching and rash.  Neurological:  Positive for dizziness. Negative for headaches and numbness.  Hematological:  Does not bruise/bleed easily.  Psychiatric/Behavioral:  Positive for sleep disturbance. Negative for depression and suicidal ideas. The patient is not nervous/anxious.   All other systems reviewed and are negative.    VITALS:   Blood pressure (!) 163/90, pulse 72, temperature 97.8 F (36.6 C), temperature source Tympanic, resp. rate 16, weight 257 lb 8 oz (116.8 kg), SpO2 98%.  Wt Readings from Last 3 Encounters:  05/19/23 257 lb 8 oz (116.8 kg)  05/02/23 258 lb (117 kg)  03/22/23 254 lb 3.2 oz (115.3 kg)    Body mass index is 35.91 kg/m.  Performance status (ECOG): 1 - Symptomatic but completely ambulatory  PHYSICAL EXAM:   Physical Exam Vitals and nursing note reviewed. Exam conducted with a chaperone present.  Constitutional:      Appearance: Normal appearance.  Cardiovascular:     Rate and Rhythm: Normal rate and regular rhythm.     Pulses: Normal pulses.     Heart sounds: Normal heart sounds.  Pulmonary:     Effort: Pulmonary effort is normal.     Breath sounds: Normal breath sounds.  Abdominal:     Palpations: Abdomen is soft. There is no hepatomegaly, splenomegaly or mass.     Tenderness: There is no abdominal tenderness.  Musculoskeletal:     Right lower leg: No edema.     Left lower leg: No edema.  Lymphadenopathy:     Cervical: No cervical adenopathy.     Right cervical: No superficial, deep or posterior cervical adenopathy.    Left cervical: No superficial, deep or posterior cervical  adenopathy.     Upper Body:     Right upper body: No supraclavicular or axillary adenopathy.     Left upper body: No supraclavicular or axillary adenopathy.  Neurological:     General: No focal deficit present.     Mental Status: He is alert and oriented to person, place, and time.  Psychiatric:        Mood and Affect: Mood normal.        Behavior: Behavior normal.     LABS:      Latest Ref Rng & Units 05/19/2023   12:35 PM 05/03/2023   10:01 AM 03/08/2023   10:53 AM  CBC  WBC 4.0 - 10.5 K/uL 5.2  6.0  5.9   Hemoglobin 13.0 - 17.0 g/dL 19.1  47.8  29.5   Hematocrit 39.0 - 52.0 % 38.2  37.9  38.4   Platelets 150 - 400 K/uL 106  96  38       Latest Ref Rng & Units 05/19/2023   12:35 PM 05/03/2023   10:01 AM 01/15/2022   10:18 AM  CMP  Glucose 70 - 99 mg/dL 621  308  657   BUN 8 - 23 mg/dL 23  20  21    Creatinine 0.61 - 1.24 mg/dL 8.46  9.62  9.52   Sodium  135 - 145 mmol/L 131  133  133   Potassium 3.5 - 5.1 mmol/L 4.1  4.1  4.1   Chloride 98 - 111 mmol/L 102  102  102   CO2 22 - 32 mmol/L 20  21  21    Calcium 8.9 - 10.3 mg/dL 9.0  8.9  9.1   Total Protein 6.5 - 8.1 g/dL 7.4  7.2  7.1   Total Bilirubin 0.3 - 1.2 mg/dL 0.8  0.7  0.7   Alkaline Phos 38 - 126 U/L 59  53  58   AST 15 - 41 U/L 18  21  23    ALT 0 - 44 U/L 18  19  21       No results found for: "CEA1", "CEA" / No results found for: "CEA1", "CEA" No results found for: "PSA1" No results found for: "JXB147" No results found for: "CAN125"  Lab Results  Component Value Date   TOTALPROTELP 6.9 12/04/2018   ALBUMINELP 3.9 12/04/2018   A1GS 0.2 12/04/2018   A2GS 0.7 12/04/2018   BETS 1.0 12/04/2018   GAMS 1.1 12/04/2018   MSPIKE Not Observed 12/04/2018   SPEI Comment 12/04/2018   No results found for: "TIBC", "FERRITIN", "IRONPCTSAT" Lab Results  Component Value Date   LDH 156 05/03/2023   LDH 137 01/15/2022   LDH 147 07/02/2021     STUDIES:   DG Knee AP/LAT W/Sunrise Left  Result Date:  05/02/2023 X-rays.  Left total knee arthroplasty April 14, 2016 this is the 7-year annual follow-up This is a Sigma posterior stabilized total knee There is no sign of loosening on its look to be appropriate size and alignment looks to be normal Stable postop total knee at 7 years

## 2023-05-19 NOTE — Patient Instructions (Signed)
[facility]  Discharge Instructions  You were seen and examined today by Dr. Ellin Saba.  Dr. Ellin Saba discussed your most recent lab work which revealed that everything looks good except that your kidney function is elevated.  Continue taking the Promacta 25 mg once daily.  Follow-up as scheduled in 1 month.    Thank you for choosing [facility] to provide your oncology and hematology care.   To afford each patient quality time with our provider, please arrive at least 15 minutes before your scheduled appointment time. You may need to reschedule your appointment if you arrive late (10 or more minutes). Arriving late affects you and other patients whose appointments are after yours.  Also, if you miss three or more appointments without notifying the office, you may be dismissed from the clinic at the provider's discretion.    Again, thank you for choosing [facility].  Our hope is that these requests will decrease the amount of time that you wait before being seen by our physicians.   If you have a lab appointment with the Cancer Center - please note that after April 8th, all labs will be drawn in the cancer center.  You do not have to check in or register with the main entrance as you have in the past but will complete your check-in at the cancer center.            _____________________________________________________________  Should you have questions after your visit to [facility], please contact our office at [phone] and follow the prompts.  Our office hours are 8:00 a.m. to 4:30 p.m. Monday - Thursday and 8:00 a.m. to 2:30 p.m. Friday.  Please note that voicemails left after 4:00 p.m. may not be returned until the following business day.  We are closed weekends and all major holidays.  You do have access to a nurse 24-7, just call the main number to the clinic [phone] and do not press any  options, hold on the line and a nurse will answer the phone.    For prescription refill requests, have your pharmacy contact our office and allow 72 hours.    Masks are no longer required in the cancer centers. If you would like for your care team to wear a mask while they are taking care of you, please let them know. You may have one support person who is at least 80 years old accompany you for your appointments.

## 2023-06-09 ENCOUNTER — Institutional Professional Consult (permissible substitution): Payer: Medicare Other | Admitting: Neurology

## 2023-06-10 ENCOUNTER — Telehealth: Payer: Self-pay

## 2023-06-10 ENCOUNTER — Other Ambulatory Visit (HOSPITAL_COMMUNITY): Payer: Self-pay

## 2023-06-10 NOTE — Telephone Encounter (Signed)
Oral Oncology Patient Advocate Encounter  Re-authorization   Received notification that prior authorization for Promacta is due for renewal.   PA submitted on 06/10/23  Key [id number]  Status is pending     Ardeen Fillers, [name] Oncology Pharmacy Patient Advocate  [facility]  [phone] (phone) [phone] (fax) 06/10/2023 2:59 PM

## 2023-06-10 NOTE — Telephone Encounter (Signed)
Oral Oncology Patient Advocate Encounter  Prior Authorization for [name] has been approved.    PA# [id number]  Effective dates: 06/10/23 through 10/24/98  Patient may continue to receive medication from [facility].    Ardeen Fillers, [name] Oncology Pharmacy Patient Advocate  [facility]  [phone] (phone) [phone] (fax) 06/10/2023 3:42 PM

## 2023-06-21 DIAGNOSIS — Z6836 Body mass index (BMI) 36.0-36.9, adult: Secondary | ICD-10-CM | POA: Diagnosis not present

## 2023-06-21 DIAGNOSIS — J020 Streptococcal pharyngitis: Secondary | ICD-10-CM | POA: Diagnosis not present

## 2023-06-21 DIAGNOSIS — J039 Acute tonsillitis, unspecified: Secondary | ICD-10-CM | POA: Diagnosis not present

## 2023-06-21 DIAGNOSIS — J36 Peritonsillar abscess: Secondary | ICD-10-CM | POA: Diagnosis not present

## 2023-06-21 DIAGNOSIS — U071 COVID-19: Secondary | ICD-10-CM | POA: Diagnosis not present

## 2023-06-21 DIAGNOSIS — E669 Obesity, unspecified: Secondary | ICD-10-CM | POA: Diagnosis not present

## 2023-06-23 ENCOUNTER — Inpatient Hospital Stay: Payer: Medicare Other

## 2023-06-23 ENCOUNTER — Inpatient Hospital Stay: Payer: Medicare Other | Admitting: Hematology

## 2023-06-23 NOTE — Progress Notes (Incomplete)
[facility] [address], [name] [address]    Clinic Day:  06/23/23   Referring physician: Benita Stabile, MD  Patient Care Team: Benita Stabile, MD as PCP - General (General Practice)   ASSESSMENT & PLAN:   Assessment: 1.  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  Bone marrow biopsy in 2009 was reportedly normal.  This was done in [place]. -CTAP showed fatty infiltration of the liver with normal spleen. -Work-up for nutritional deficiencies, plasma cell disorder was negative.  Infectious etiology was negative.  LDH was normal. -Platelet count dropped to 29 on 01/26/2020 and was treated with dexamethasone for 4 days. -Dexamethasone made to him have epigastric pain, indigestion, burping and hiccups. -Peak platelet count reached 108 on 01/31/2020. -As he cannot tolerate dexamethasone, options include Promacta. - Promacta 25 mg daily started on 04/20/2023.    Plan: 1.  Immune mediated thrombocytopenia: - Promacta 25 mg daily started on 04/20/2023. - Denies any major GI side effects. - Reviewed labs today: Normal LFTs.  Creatinine elevated at 1.28.  I reviewed the drug monograph which did not indicate that it causes elevated creatinine.  His blood sugar was elevated at 265 but he did not take his morning insulin.  CBC shows platelet count increased to 106.  Hemoglobin is stable at 13.7 and white count normal. - Recommend continuing Promacta 25 mg daily.  Will consider dose reduction if platelet count more than 200 K. - Recommend follow-up in 4 weeks with repeat labs.  2.  Hypertension: - Continue Norvasc 10 mg daily, Benicar 40 mg daily and HCTZ 12.5 mg daily.  Blood pressure today is 163/90.  He reports that he has not taken blood pressure medication this morning.    No orders of the defined types were placed in this encounter.    [name],acting as a Neurosurgeon for Doreatha Massed, MD.,have documented all  relevant documentation on the behalf of Doreatha Massed, MD,as directed by  Doreatha Massed, MD while in the presence of Doreatha Massed, MD.  ***     [place]   8/29/20248:04 AM  CHIEF COMPLAINT:   Diagnosis: immune-mediated thrombocytopenia    Cancer Staging  No matching staging information was found for the patient.    Prior Therapy: none  Current Therapy:  observation   HISTORY OF PRESENT ILLNESS:   Oncology History   No history exists.     INTERVAL HISTORY:   Brian [name] is a 82 y.o. male presenting to clinic today for follow up of immune-mediated thrombocytopenia. He was last seen by me on 05/19/23.  Today, he states that he is doing well overall. His appetite level is at ***%. His energy level is at ***%.  PAST MEDICAL HISTORY:   Past Medical History: Past Medical History:  Diagnosis Date   Arthritis    Atypical chest pain    Low-risk exercise stress Cardiolite EF 60% April 2008. Reportedly normal remote cardiac catheterization @ [facility] EF 60%...nuclear....2008   Bradycardia    hospital 03/2010 stopped beta blocker   Complication of anesthesia    Diabetes mellitus without complication (HCC)    Dyslipidemia    Hypertension    Mild aortic regurgitation with LV dilation by prior echocardiogram    Neuropathy    in feet   Obesity    Obstructive sleep apnea    C-Pap compliant   PONV (postoperative nausea and vomiting)    Thrombocytopenia (HCC)    mild  follow Dr. 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   Procedure: LEFT TOTAL KNEE ARTHROPLASTY;   Surgeon: Vickki Hearing, MD;  Location: [facility];  Service: Orthopedics;  Laterality: Left;    Social History: Social History   Socioeconomic History   Marital status: Married    Spouse name: Not on file   Number of children: 2   Years of education: Not on file   Highest education level: Some college, no degree  Occupational History   Occupation: retired  Tobacco Use   Smoking status: Never   Smokeless tobacco: Never  Vaping Use   Vaping status: Never Used  Substance and Sexual Activity   Alcohol use: Not Currently    Comment: wine on occasion   Drug use: No   Sexual activity: Not Currently  Other Topics Concern   Not on file  Social History Narrative   Lives at home with wife   Right handed   Caffeine: Diet coke roughly 12 oz in a day    Social Determinants of Health   Financial Resource Strain: Low Risk  (09/29/2020)   Overall Financial Resource Strain (CARDIA)    Difficulty of Paying Living Expenses: Not hard at all  Food Insecurity: No Food Insecurity (09/29/2020)   Hunger Vital Sign    Worried About Running Out of Food in the Last Year: Never true    Ran Out of Food in the Last Year: Never true  Transportation Needs: No Transportation Needs (09/29/2020)   PRAPARE - Administrator, Civil Service (Medical): No    Lack of Transportation (Non-Medical): No  Physical Activity: Inactive (09/29/2020)   Exercise Vital Sign    Days of Exercise per Week: 0 days    Minutes of Exercise per Session: 0 min  Stress: No Stress Concern Present (09/29/2020)   [employer] of Occupational Health - Occupational Stress Questionnaire    Feeling of Stress : Not at all  Social Connections: Moderately Integrated (09/29/2020)   Social Connection and Isolation Panel [NHANES]    Frequency of Communication with Friends and Family: More than three times a week    Frequency of Social Gatherings with Friends and Family: Three times a week    Attends Religious Services: 1 to 4  times per year    Active Member of Clubs or Organizations: No    Attends Banker Meetings: Never    Marital Status: Married  Catering manager Violence: Not At Risk (09/29/2020)   Humiliation, Afraid, Rape, and Kick questionnaire    Fear of Current or Ex-Partner: No    Emotionally Abused: No    Physically Abused: No    Sexually Abused: No    Family History: Family History  Problem Relation Age of Onset   Heart attack Mother        fatal, cause of death   Heart attack Maternal Grandmother    Memory loss Neg Hx     Current Medications:  Current Outpatient Medications:    acetaminophen (TYLENOL) 500 MG tablet, Take 1,000 mg by mouth every 6 (six) hours  as needed for mild pain or moderate pain., Disp: , Rfl:    amLODipine (NORVASC) 10 MG tablet, Take 10 mg by mouth daily. , Disp: , Rfl:    atorvastatin (LIPITOR) 20 MG tablet, Take 20 mg by mouth daily., Disp: , Rfl:    B Complex Vitamins (VITAMIN B-COMPLEX) TABS, Take 1 tablet by mouth daily. , Disp: , Rfl:    doxycycline (VIBRAMYCIN) 100 MG capsule, Take 100 mg by mouth in the morning and at bedtime., Disp: , Rfl:    eltrombopag (PROMACTA) 25 MG tablet, Take 1 tablet (25 mg total) by mouth daily. Take on an empty stomach, 1 hour before a meal or 2 hours after., Disp: 90 tablet, Rfl: 3   fluticasone (FLONASE) 50 MCG/ACT nasal spray, Place 2 sprays into both nostrils at bedtime. , Disp: , Rfl:    gabapentin (NEURONTIN) 600 MG tablet, Take 1,200 mg by mouth daily at 2 am., Disp: , Rfl:    glipiZIDE (GLUCOTROL XL) 10 MG 24 hr tablet, Take 10 mg by mouth 2 (two) times daily., Disp: , Rfl:    GLUCOSAMINE-CHONDROITIN-MSM PO, Take 1,500 mg by mouth daily., Disp: , Rfl:    Glucose Blood (GNP TRUE METRIX GLUCOSE STRIPS VI), USE THREE TIMES DAILY, Disp: , Rfl:    hydrochlorothiazide (MICROZIDE) 12.5 MG capsule, TAKE 1 CAPSULE BY MOUTH EVERY DAY IN THE MORNING FOR 90 DAYS, Disp: , Rfl:    insulin glargine, 2 Unit Dial, (TOUJEO MAX  SOLOSTAR) 300 UNIT/ML Solostar Pen, , Disp: , Rfl:    LORazepam (ATIVAN) 0.5 MG tablet, Take 0.5 mg by mouth daily as needed., Disp: , Rfl:    meclizine (ANTIVERT) 25 MG tablet, TAKE 1/2 TO 1 TABLET BY MOUTH EVERY 8 HOURS AS NEEDED FOR DIZZINESS/VERTIGO, Disp: , Rfl:    Melatonin 10 MG CAPS, Take 10 mg by mouth at bedtime. , Disp: , Rfl:    metFORMIN (GLUCOPHAGE) 500 MG tablet, Take 500 mg by mouth 4 (four) times daily. , Disp: , Rfl:    mupirocin ointment (BACTROBAN) 2 %, Apply 1 application  topically 3 times/day as needed-between meals & bedtime., Disp: , Rfl:    olmesartan (BENICAR) 40 MG tablet, Take 40 mg by mouth daily. , Disp: , Rfl:    triamcinolone cream (KENALOG) 0.5 %, , Disp: , Rfl:    vitamin B-12 (CYANOCOBALAMIN) 1000 MCG tablet, Take 1,000 mcg by mouth daily., Disp: , Rfl:    Allergies: Allergies  Allergen Reactions   Contrast Media [Iodinated Contrast Media] Other (See Comments)    Allergic to MRI contrast. Eyes swelling very badly    Penicillin G    Penicillins Other (See Comments)    dizziness   Gemtesa [Vibegron] Rash    Patient reports red spots on his arms and itching    REVIEW OF SYSTEMS:   Review of Systems  Constitutional:  Negative for chills, fatigue and fever.  HENT:   Negative for lump/mass, mouth sores, nosebleeds, sore throat and trouble swallowing.   Eyes:  Negative for eye problems.  Respiratory:  Negative for cough and shortness of breath.   Cardiovascular:  Negative for chest pain, leg swelling and palpitations.  Gastrointestinal:  Negative for abdominal pain, constipation, diarrhea, nausea and vomiting.  Genitourinary:  Negative for bladder incontinence, difficulty urinating, dysuria, frequency, hematuria and nocturia.   Musculoskeletal:  Negative for arthralgias, back pain, flank pain, myalgias and neck pain.  Skin:  Negative for itching and rash.  Neurological:  Negative for dizziness, headaches and  numbness.  Hematological:  Does not  bruise/bleed easily.  Psychiatric/Behavioral:  Negative for depression, sleep disturbance and suicidal ideas. The patient is not nervous/anxious.   All other systems reviewed and are negative.    VITALS:   There were no vitals taken for this visit.  Wt Readings from Last 3 Encounters:  05/19/23 257 lb 8 oz (116.8 kg)  05/02/23 258 lb (117 kg)  03/22/23 254 lb 3.2 oz (115.3 kg)    There is no height or weight on file to calculate BMI.  Performance status (ECOG): 1 - Symptomatic but completely ambulatory  PHYSICAL EXAM:   Physical Exam Vitals and nursing note reviewed. Exam conducted with a chaperone present.  Constitutional:      Appearance: Normal appearance.  Cardiovascular:     Rate and Rhythm: Normal rate and regular rhythm.     Pulses: Normal pulses.     Heart sounds: Normal heart sounds.  Pulmonary:     Effort: Pulmonary effort is normal.     Breath sounds: Normal breath sounds.  Abdominal:     Palpations: Abdomen is soft. There is no hepatomegaly, splenomegaly or mass.     Tenderness: There is no abdominal tenderness.  Musculoskeletal:     Right lower leg: No edema.     Left lower leg: No edema.  Lymphadenopathy:     Cervical: No cervical adenopathy.     Right cervical: No superficial, deep or posterior cervical adenopathy.    Left cervical: No superficial, deep or posterior cervical adenopathy.     Upper Body:     Right upper body: No supraclavicular or axillary adenopathy.     Left upper body: No supraclavicular or axillary adenopathy.  Neurological:     General: No focal deficit present.     Mental Status: He is alert and oriented to person, place, and time.  Psychiatric:        Mood and Affect: Mood normal.        Behavior: Behavior normal.     LABS:      Latest Ref Rng & Units 05/19/2023   12:35 PM 05/03/2023   10:01 AM 03/08/2023   10:53 AM  CBC  WBC 4.0 - 10.5 K/uL 5.2  6.0  5.9   Hemoglobin 13.0 - 17.0 g/dL 16.1  09.6  04.5   Hematocrit 39.0 -  52.0 % 38.2  37.9  38.4   Platelets 150 - 400 K/uL 106  96  38       Latest Ref Rng & Units 05/19/2023   12:35 PM 05/03/2023   10:01 AM 01/15/2022   10:18 AM  CMP  Glucose 70 - 99 mg/dL 409  811  914   BUN 8 - 23 mg/dL 23  20  21    Creatinine 0.61 - 1.24 mg/dL 7.82  9.56  2.13   Sodium 135 - 145 mmol/L 131  133  133   Potassium 3.5 - 5.1 mmol/L 4.1  4.1  4.1   Chloride 98 - 111 mmol/L 102  102  102   CO2 22 - 32 mmol/L 20  21  21    Calcium 8.9 - 10.3 mg/dL 9.0  8.9  9.1   Total Protein 6.5 - 8.1 g/dL 7.4  7.2  7.1   Total Bilirubin 0.3 - 1.2 mg/dL 0.8  0.7  0.7   Alkaline Phos 38 - 126 U/L 59  53  58   AST 15 - 41 U/L 18  21  23    ALT 0 -  44 U/L 18  19  21       No results found for: "CEA1", "CEA" / No results found for: "CEA1", "CEA" No results found for: "PSA1" No results found for: "VZD638" No results found for: "CAN125"  Lab Results  Component Value Date   TOTALPROTELP 6.9 12/04/2018   ALBUMINELP 3.9 12/04/2018   A1GS 0.2 12/04/2018   A2GS 0.7 12/04/2018   BETS 1.0 12/04/2018   GAMS 1.1 12/04/2018   MSPIKE Not Observed 12/04/2018   SPEI Comment 12/04/2018   No results found for: "TIBC", "FERRITIN", "IRONPCTSAT" Lab Results  Component Value Date   LDH 156 05/03/2023   LDH 137 01/15/2022   LDH 147 07/02/2021     STUDIES:   No results found.

## 2023-07-06 DIAGNOSIS — E1140 Type 2 diabetes mellitus with diabetic neuropathy, unspecified: Secondary | ICD-10-CM | POA: Diagnosis not present

## 2023-07-06 DIAGNOSIS — E782 Mixed hyperlipidemia: Secondary | ICD-10-CM | POA: Diagnosis not present

## 2023-07-12 DIAGNOSIS — R809 Proteinuria, unspecified: Secondary | ICD-10-CM | POA: Diagnosis not present

## 2023-07-12 DIAGNOSIS — I129 Hypertensive chronic kidney disease with stage 1 through stage 4 chronic kidney disease, or unspecified chronic kidney disease: Secondary | ICD-10-CM | POA: Diagnosis not present

## 2023-07-12 DIAGNOSIS — D696 Thrombocytopenia, unspecified: Secondary | ICD-10-CM | POA: Diagnosis not present

## 2023-07-12 DIAGNOSIS — I10 Essential (primary) hypertension: Secondary | ICD-10-CM | POA: Diagnosis not present

## 2023-07-12 DIAGNOSIS — E1140 Type 2 diabetes mellitus with diabetic neuropathy, unspecified: Secondary | ICD-10-CM | POA: Diagnosis not present

## 2023-07-12 DIAGNOSIS — G4733 Obstructive sleep apnea (adult) (pediatric): Secondary | ICD-10-CM | POA: Diagnosis not present

## 2023-07-12 DIAGNOSIS — N182 Chronic kidney disease, stage 2 (mild): Secondary | ICD-10-CM | POA: Diagnosis not present

## 2023-07-12 DIAGNOSIS — E1142 Type 2 diabetes mellitus with diabetic polyneuropathy: Secondary | ICD-10-CM | POA: Diagnosis not present

## 2023-07-12 DIAGNOSIS — R42 Dizziness and giddiness: Secondary | ICD-10-CM | POA: Diagnosis not present

## 2023-07-12 DIAGNOSIS — G9009 Other idiopathic peripheral autonomic neuropathy: Secondary | ICD-10-CM | POA: Diagnosis not present

## 2023-07-12 DIAGNOSIS — E782 Mixed hyperlipidemia: Secondary | ICD-10-CM | POA: Diagnosis not present

## 2023-07-18 DIAGNOSIS — Z23 Encounter for immunization: Secondary | ICD-10-CM | POA: Diagnosis not present

## 2023-08-08 ENCOUNTER — Other Ambulatory Visit: Payer: Self-pay | Admitting: *Deleted

## 2023-08-08 DIAGNOSIS — D693 Immune thrombocytopenic purpura: Secondary | ICD-10-CM

## 2023-08-24 ENCOUNTER — Other Ambulatory Visit (HOSPITAL_COMMUNITY): Payer: Self-pay

## 2023-08-24 NOTE — Telephone Encounter (Signed)
Error

## 2023-08-30 ENCOUNTER — Telehealth: Payer: Self-pay

## 2023-08-30 NOTE — Telephone Encounter (Signed)
Oral Oncology Patient Advocate Encounter   Received notification that patient is due for re-enrollment for assistance for Promacta through [employer].   Re-enrollment process has been initiated and will be submitted upon completion of necessary documents.  NPA[employer]'s phone number [phone].   I will continue to follow until final determination.   Ardeen Fillers, [name] Oncology Pharmacy Patient Advocate  [facility]  [phone] (phone) [phone] (fax) 08/30/2023 10:02 AM

## 2023-08-30 NOTE — Telephone Encounter (Signed)
Called and left VM for patient regarding re-enrollment application for [employer]PA[employer]. Requested call back to set up time to obtain patient signature to send off re-enrollment application. I will continue to reach out to patient. I will continue to follow and update until final determination.    Ardeen Fillers, [name] Oncology Pharmacy Patient Advocate  [facility]  [phone] (phone) [phone] (fax) 08/30/2023 10:04 AM

## 2023-09-01 ENCOUNTER — Encounter: Payer: Self-pay | Admitting: Neurology

## 2023-09-01 ENCOUNTER — Ambulatory Visit (INDEPENDENT_AMBULATORY_CARE_PROVIDER_SITE_OTHER): Payer: Medicare Other | Admitting: Neurology

## 2023-09-01 VITALS — BP 177/93 | HR 76 | Ht 71.0 in | Wt 263.0 lb

## 2023-09-01 DIAGNOSIS — G20C Parkinsonism, unspecified: Secondary | ICD-10-CM

## 2023-09-01 DIAGNOSIS — R29898 Other symptoms and signs involving the musculoskeletal system: Secondary | ICD-10-CM | POA: Diagnosis not present

## 2023-09-01 DIAGNOSIS — R2689 Other abnormalities of gait and mobility: Secondary | ICD-10-CM | POA: Diagnosis not present

## 2023-09-01 DIAGNOSIS — I639 Cerebral infarction, unspecified: Secondary | ICD-10-CM

## 2023-09-01 DIAGNOSIS — R269 Unspecified abnormalities of gait and mobility: Secondary | ICD-10-CM

## 2023-09-01 DIAGNOSIS — Z8673 Personal history of transient ischemic attack (TIA), and cerebral infarction without residual deficits: Secondary | ICD-10-CM

## 2023-09-01 DIAGNOSIS — M6281 Muscle weakness (generalized): Secondary | ICD-10-CM

## 2023-09-01 DIAGNOSIS — R296 Repeated falls: Secondary | ICD-10-CM | POA: Diagnosis not present

## 2023-09-01 NOTE — Patient Instructions (Addendum)
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 There may also be additional residual signs and symptoms (often asymmetric), from previous strokes such as limb weakness, numbness, abnormal reflexes or abnormal speech. Computerized tomography (CT) or magnetic resonance imaging (MRI) of the brain are likely to be abnormal in 90-100 percent of cases of vascular parkinsonism, often showing multiple small strokes in the deep portions of the brain (white matter changes). If a patient has never been evaluated for the treatment of stroke risk factors, such work up will be needed. This might include additional blood tests, evaluation for possible heart disease and narrowing of the blood vessels of the head or neck.   Electromyoneurogram Electromyoneurogram is a test to check how well your muscles and nerves are working. This procedure includes the combined use of electromyogram (EMG) and nerve conduction study (NCS). EMG is used to evaluate muscles and the nerves that control those muscles. NCS, which is also called electroneurogram, measures how well your nerves conduct electricity. The procedures should be done together to check if your muscles and nerves are healthy. If the results of the tests are abnormal, this may indicate disease or injury, such as a neuromuscular disease or peripheral nerve damage. Tell a health care provider about: Any allergies you have. All medicines you are taking, including vitamins, herbs, eye drops, creams, and over-the-counter medicines. Any bleeding problems you  have. Any surgeries you have had. Any medical conditions you have. What are the risks? Generally, this is a safe procedure. However, problems may occur, including: Bleeding or bruising. Infection where the electrodes were inserted. What happens before the test? Medicines Take all of your usually prescribed medications before this testing is performed. Do not stop your blood thinners unless advised by your prescribing physician. General instructions Your health care provider may ask you to warm the limb that will be checked with warm water, hot pack, or wrapping the limb in a blanket. Do not use lotions or creams on the same day that you will be having the procedure. What happens during the test? For EMG  Your health care provider will ask you to stay in a position so that the muscle being studied can be accessed. You will be sitting or lying down. You may be given a medicine to numb the area (local anesthetic) and the skin will be disinfected. A very thin needle that has an electrode will be inserted into your muscle, one muscle at a time. Typically, multiple muscles are evaluated during a single study. Another small electrode will be placed on your skin near the muscle. Your health care provider will ask you to continue to remain still. The electrodes will record the electrical activity of your muscles. You may see this on a monitor or hear it in the room. After your muscles have been studied at rest, your health care provider will ask you to contract or flex your muscles. The electrodes will record the electrical activity of your muscles. Your health care provider will remove the electrodes and the electrode needle when the procedure is finished. The procedure may vary among health care providers and  hospitals. For NCS  An electrode that records your nerve activity (recording electrode) will be placed on your skin by the muscle that is being studied. An electrode that is used as a  reference (reference electrode) will be placed near the recording electrode. A paste or gel will be applied to your skin between the recording electrode and the reference electrode. Your nerve will be stimulated with a mild shock. The speed of the nerves and strength of response is recorded by the electrodes. Your health care provider will remove the electrodes and the gel when the procedure is finished. The procedure may vary among health care providers and hospitals. What can I expect after the test? It is up to you to get your test results. Ask your health care provider, or the department that is doing the test, when your results will be ready. Your health care provider may: Give you medicines for any pain. Monitor the insertion sites to make sure that bleeding stops. You should be able to drive yourself to and from the test. Discomfort can persist for a few hours after the test, but should be better the next day. Contact a health care provider if: You have swelling, redness, or drainage at any of the insertion sites. Summary Electromyoneurogram is a test to check how well your muscles and nerves are working. If the results of the tests are abnormal, this may indicate disease or injury. This is a safe procedure. However, problems may occur, such as bleeding and infection. Your health care provider will do two tests to complete this procedure. One checks your muscles (EMG) and another checks your nerves (NCS). It is up to you to get your test results. Ask your health care provider, or the department that is doing the test, when your results will be ready. This information is not intended to replace advice given to you by your health care provider. Make sure you discuss any questions you have with your health care provider. Document Revised: 06/24/2021 Document Reviewed: 05/24/2021 Elsevier Patient Education  2024 [employer].

## 2023-09-01 NOTE — Progress Notes (Signed)
[name]    Provider:  Dr Lucia [name] Requesting Provider: Lupita Raider, NP Primary Care Provider:  Benita Stabile, MD  CC:  legs giving out, imbalance  09/01/2023: Saw patient in 2021 for multiple concerns. He states all of a sudden his legs don't hold him up. He has a cpap and uses it every night but he has insomnia and doesn;t sleep more that 3-4 hours. He denies he has dizziness. He states that his legs don;t hold him up and he is not as stable as he used to be. His legs give out. He denies dizziness. He does exercises with weight in the legs. His arms are fine. No back pain, no shooting pain in his legs. He has neuropathy whoich is getting worse. Doesn't feel it is the neuropathy. He can;t stand for any length of time his legs give out. He can;t stand in line even with his cane and not because of back pain but because of leg weakness he will start swaying from imbalance. His legs just "fold" he fell over. No tremors. They just give out. He denies shaking. He shuffles and that has gotten worse since then. Voice has gotten softer. Not acting out his dreams. He is up and down all night and has insomnia je is wide awake at midnight. Shuffling gait. No problems with smell or taste. Late at night he may have trouble coughing, swallowing he can eat and drink without problem though he says. Handwriting is getting worse. Not lightheaded he jst states his legs are weak. No dizziness, no lightheadedness, no vertigo or room spinning, legs are weak. He has fallen several times. Memory is pretty good.   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  No acute abnormalities noted.     April 2021; Brian, Brian [name]: Recommendations/Plan: Based on the findings of the present evaluation, the following recommendations are offered:   1.         The patient will be reassured that his testing results are within normal limits and not indicative of dementia at this time. I will review strategies to maintain brain health, including cardiovascular exercise, mental stimulation and social engagement.    2.         Optimal control of vascular risk factors is necessary to reduce the risk of further cognitive decline.    3.         The impact of stress on cognitive functioning will be reviewed. He will likely benefit from regular stress management and self care activities.    4.         He may benefit from additional PT and balance training.    5.         Neuropsychological re-evaluation is recommended in approximately one year (or sooner) to monitor treatment efficacy, to assist with the management of the patient, to determine any clinical and functional significance of brain abnormality over time, as well as to document any potential improvement or decline in cognitive functioning. Lastly, any follow-up testing will help delineate the specific cognitive basis of any new functional complaints.    6.         Nutritional factors can have a significant effect on psychological and emotional status,  as well as overall brain functioning.  The following general recommendations have been associated with improvements in depression and other psychological symptoms, as well as lower risk for dementia and other forms of cognitive impairment.    Patient complains of symptoms per HPI as well as the following symptoms: none . Pertinent negatives and positives per HPI. All others negative    HPI 10/08/2020:  Brian [name] is a 82 y.o. male here as requested by Brian Raider, NP for dizziness and cognitive impairment and neuropathy; unfortunately it would be very difficult to cover all 3 of these in 1  appointment so I suggest we focus on cognitive impairment at least today. PMHx thrombocytopenia, arthritis, bradycardia, diabetes, dyslipidemia, hypertension, obesity, obstructive sleep apnea compliant with CPAP, follows with the cancer center for his thrombocytopenia.  I reviewed Brian [name] notes: Having vertigo spells, fell at home, treated for UTI, wife was worried he had dementia, he has had several falls, with a fever and UTI.  He also has urinary incontinence.  He reports lightheadedness, dizziness, tremors.  Brian [name] examination showed an obese well-nourished gentleman, able to answer questions, normal auscultation of the lungs, and heart, warm extremities normal pulses, no edema clubbing or cyanosis.  Further information includes episodes of odd behavior that could be related to early dementia, leaving doors open at home, the fridge door, some memory issues wife has noted, he is also seen Brian. Pollyann [name] in ENT for dizziness and vertigo, has meclizine which seems to help and they have been decreasing gabapentin, he also has idiopathic neuropathy but trying to get off of gabapentin because it is making him groggy in the morning.  If he is sitting on the floor with no support he just goes backwards. Started a bout a year ago. He has a lot of dizziness. He has neuropathy and he feels when he stands his legs are weak. He has leg weakness. It is a combination of his leg weakness, he gets wobbly getting up, once he is up he is fine, he may start to fall forward. No low back pain. Arms are not affected. He gets dizzy and falls back. He was on the ground and couldn't pull himself up. She couldn't help him up. So arms and legs are weak. Dizziness is when he gets up, lightheadedness, usually when he stands up. Sitting down makes it go away most of the time. He has to hold on something to get up. He uses a cane. He gets tired easily and after a certain amou of walking legs feel tired, he can sit down and get back up and  walk again and repeat the process. A grocery car helps him walk longer distances. Also confusion and memory problems, ataxia, falls. Wife also talks to me, says his memory is impaired, ongoing for more than a year, he has left the stove on, the fridge door open, she doesn't let him drive anymore. Will order formal memory testing.   Reviewed notes, labs and imaging from outside physicians, which showed:  I reviewed blood work from April 30, 2020 which showed CBC with low platelets otherwise unremarkable, CMP showed elevated glucose, BUN 19, creatinine 0.93, otherwise appeared normal.  Lipid panel showed elevated triglycerides 391, LDL of 80, HDL of 25, and a hemoglobin A1c of 7.  B12 low 249 05/08/2020 B12 276 09/26/2020  CT head and cervical spine 04/09/2020:  IMPRESSION: 1. Right orbital floor fracture. Right periorbital preseptal soft tissue swelling. Please see dedicated maxillofacial CT. 2. No  acute intracranial findings. 3. Periventricular white matter and corona radiata hypodensities favor chronic ischemic microvascular white matter disease.   IMPRESSION: 1. No cervical spine fracture or acute subluxation. 2. Mild bilateral osseous foraminal impingement at C5-6 due to uncinate spurring. 3. Mild bilateral carotid bulb atherosclerotic calcification. 4. Mild scarring in the apical segment right upper lobe.    Review of Systems: Patient complains of symptoms per HPI as well as the following symptoms: falls, weakness, confusion. Pertinent negatives and positives per HPI. All others negative.   Social History   Socioeconomic History   Marital status: Married    Spouse name: Not on file   Number of children: 2   Years of education: Not on file   Highest education level: Some college, no degree  Occupational History   Occupation: retired  Tobacco Use   Smoking status: Never   Smokeless tobacco: Never  Vaping Use   Vaping status: Never Used  Substance and Sexual Activity    Alcohol use: Not Currently   Drug use: No   Sexual activity: Not Currently  Other Topics Concern   Not on file  Social History Narrative   Lives at home with wife   Right handed   Caffeine: Diet coke roughly 12 oz in a day    Social Determinants of Health   Financial Resource Strain: Low Risk  (09/29/2020)   Overall Financial Resource Strain (CARDIA)    Difficulty of Paying Living Expenses: Not hard at all  Food Insecurity: No Food Insecurity (09/29/2020)   Hunger Vital Sign    Worried About Running Out of Food in the Last Year: Never true    Ran Out of Food in the Last Year: Never true  Transportation Needs: No Transportation Needs (09/29/2020)   PRAPARE - Administrator, Civil Service (Medical): No    Lack of Transportation (Non-Medical): No  Physical Activity: Inactive (09/29/2020)   Exercise Vital Sign    Days of Exercise per Week: 0 days    Minutes of Exercise per Session: 0 min  Stress: No Stress Concern Present (09/29/2020)   [employer] of Occupational Health - Occupational Stress Questionnaire    Feeling of Stress : Not at all  Social Connections: Moderately Integrated (09/29/2020)   Social Connection and Isolation Panel [NHANES]    Frequency of Communication with Friends and Family: More than three times a week    Frequency of Social Gatherings with Friends and Family: Three times a week    Attends Religious Services: 1 to 4 times per year    Active Member of Clubs or Organizations: No    Attends Banker Meetings: Never    Marital Status: Married  Catering manager Violence: Not At Risk (09/29/2020)   Humiliation, Afraid, Rape, and Kick questionnaire    Fear of Current or Ex-Partner: No    Emotionally Abused: No    Physically Abused: No    Sexually Abused: No    Family History  Problem Relation Age of Onset   Heart attack Mother        fatal, cause of death   Heart attack Maternal Grandmother    Memory loss Neg Hx     Past Medical  History:  Diagnosis Date   Arthritis    Atypical chest pain    Low-risk exercise stress Cardiolite EF 60% April 2008. Reportedly normal remote cardiac catheterization @ [facility] EF 60%...nuclear....2008   Bradycardia    hospital 03/2010 stopped beta blocker   Complication of  anesthesia    Diabetes mellitus without complication (HCC)    Dyslipidemia    Fatigue    Hypertension    Mild aortic regurgitation with LV dilation by prior echocardiogram    Neuropathy    in feet   Obesity    Obstructive sleep apnea    C-Pap compliant   PONV (postoperative nausea and vomiting)    Thrombocytopenia (HCC)    mild follow Brian. Barnie Mort   [facility] 03/2010    Patient Active Problem List   Diagnosis Date Noted   Chronic insomnia 03/16/2022   Fatigue 03/14/2022   Onychomycosis of toenail 12/30/2021   Abrasion of skin of right lower leg 12/30/2021   Chronic kidney disease, stage 2 (mild) 09/01/2021   History of diabetes mellitus 05/07/2021   Ataxia 05/07/2021   Diabetic peripheral neuropathy (HCC) 05/07/2021   Dry skin dermatitis 05/07/2021   Furuncle 05/07/2021   Hypertensive renal disease 05/07/2021   Idiopathic peripheral autonomic neuropathy 05/07/2021   Proteinuria 05/07/2021   Raised prostate specific antigen 05/07/2021   Mixed hyperlipidemia 05/04/2021   Diabetes mellitus (HCC) 03/16/2021   Neuropathy 03/16/2021   ETD (Eustachian tube dysfunction), bilateral 01/10/2018   Bilateral impacted cerumen 07/19/2017   Neck pain on right side 07/19/2017   Otalgia of right ear 07/19/2017   Tendonitis, Achilles, right 08/27/2014   Stiffness of joint, not elsewhere classified, lower leg 06/11/2014   Pain in joint, lower leg 06/11/2014   Abnormality of gait 06/11/2014   S/P total knee replacement, 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 06/03/2014   Derangement of posterior horn of medial meniscus 04/09/2014   OA (osteoarthritis) of knee 04/09/2014   Knee pain 04/09/2014   THROMBOCYTOPENIA 05/12/2010   AORTIC  INSUFFICIENCY 05/12/2010   OBESITY, UNSPECIFIED 05/11/2010   HYPERTENSION 05/11/2010   BRADYCARDIA 05/11/2010   VERTIGO 05/11/2010   Sleep apnea 05/11/2010   CHEST PAIN UNSPECIFIED 05/11/2010    Past Surgical History:  Procedure Laterality Date   CARDIAC CATHETERIZATION     KNEE ARTHROSCOPY WITH MEDIAL MENISECTOMY Left 05/31/2014   Procedure: LEFT KNEE ARTHROSCOPY WITH MEDIAL MENISECTOMY;  Surgeon: Vickki Hearing, MD;  Location: [facility];  Service: Orthopedics;  Laterality: Left;   Status post bilateral arthroscopic knee surgery     TOTAL KNEE ARTHROPLASTY Left 04/14/2016   Procedure: LEFT TOTAL KNEE ARTHROPLASTY;  Surgeon: Vickki Hearing, MD;  Location: [facility];  Service: Orthopedics;  Laterality: Left;    Current Outpatient Medications  Medication Sig Dispense Refill   amLODipine (NORVASC) 10 MG tablet Take 10 mg by mouth daily.      atorvastatin (LIPITOR) 20 MG tablet Take 20 mg by mouth daily.     B Complex Vitamins (VITAMIN B-COMPLEX) TABS Take 1 tablet by mouth daily.      eltrombopag (PROMACTA) 25 MG tablet Take 1 tablet (25 mg total) by mouth daily. Take on an empty stomach, 1 hour before a meal or 2 hours after. 90 tablet 3   fluticasone (FLONASE) 50 MCG/ACT nasal spray Place 2 sprays into both nostrils at bedtime.      gabapentin (NEURONTIN) 600 MG tablet Take 1,800 mg by mouth daily at 2 am.     glipiZIDE (GLUCOTROL XL) 5 MG 24 hr tablet Take 5 mg by mouth in the morning and at bedtime.     GLUCOSAMINE-CHONDROITIN-MSM PO Take 1,500 mg by mouth daily.     Glucose Blood (GNP TRUE METRIX GLUCOSE STRIPS VI) USE THREE TIMES DAILY     insulin glargine, 2  Unit Dial, (TOUJEO MAX SOLOSTAR) 300 UNIT/ML Solostar Pen      LORazepam (ATIVAN) 0.5 MG tablet Take 0.5 mg by mouth daily as needed.     metFORMIN (GLUCOPHAGE) 500 MG tablet Take 500 mg by mouth 4 (four) times daily.      olmesartan (BENICAR) 40 MG tablet Take 40 mg by mouth daily.      vitamin B-12 (CYANOCOBALAMIN) 1000 MCG  tablet Take 5,000 mcg by mouth daily.     No current facility-administered medications for this visit.    Allergies as of 09/01/2023 - Review Complete 09/01/2023  Allergen Reaction Noted   Contrast media [iodinated contrast media] Other (See Comments) 11/07/2020   Penicillin g  07/20/2020   Penicillins Other (See Comments) 05/11/2010   Gemtesa [vibegron] Rash 01/29/2021    Vitals: BP (!) 177/93   Pulse 76   Ht 5\' 11"  (1.803 m)   Wt 263 lb (119.3 kg)   BMI 36.68 kg/m  Last Weight:  Wt Readings from Last 1 Encounters:  09/01/23 263 lb (119.3 kg)   Last Height:   Ht Readings from Last 1 Encounters:  09/01/23 5\' 11"  (1.803 m)    Physical exam: Exam: Gen: NAD, conversant, well nourised, obese, well groomed                     CV: RRR, no MRG. No Carotid Bruits. No peripheral edema, warm, nontender Eyes: Conjunctivae clear without exudates or hemorrhage  Neuro: Detailed Neurologic Exam  Speech:    Speech is normal; fluent and spontaneous with normal comprehension.  Cognition:    The patient is oriented to person, place, and time;     recent and remote memory intact;     language fluent;     normal attention, concentration,     fund of knowledge Cranial Nerves:    The pupils are equal, round, and reactive to light. Attempted, could not visualize fundi. Visual fields are full to finger confrontation. Extraocular movements are intact. Trigeminal sensation is intact and the muscles of mastication are normal. The face is symmetric. The palate elevates in the midline. Hearing intact. Voice is normal. Shoulder shrug is normal. The tongue has normal motion without fasciculations.   Coordination: No dysmetria or ataxia notes  Gait: Wide based, low clearance, slight shuffling  Motor Observation:    No asymmetry, no atrophy, and no involuntary movements noted. Tone:    Normal muscle tone.    Posture:    Posture is slightly stooped    Strength: Some prox lower extremity  5- weakness but similar to when seen in 2021 otherwise strength is V/V in the upper and lower limbs.      Sensation: decreased sensation to the mid calves, absent vibration in great toes and medial malleolus     Reflex Exam:  DTR's: Absent Ajs, right patellar trace, left patellar surical, biceps  Toes:    The toes are equiv bilaterally.   Clonus:    Clonus is absent.    Assessment/Plan: very complicated but lovely patient. 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; Stiffness of joint, not elsewhere classified, lower leg; Pain in joint, lower leg; Abnormality of gait; Tendonitis, Achilles, right; Bilateral impacted cerumen; ETD (Eustachian tube dysfunction), bilateral; Neck pain on right side; Otalgia of right ear; Chronic kidney disease, stage 2 (mild); History of  diabetes mellitus; Onychomycosis of toenail; Abrasion of skin of right lower leg; Ataxia; Chronic insomnia; Diabetes mellitus (HCC); Diabetic peripheral neuropathy (HCC); Dry skin dermatitis; Fatigue; Furuncle; Hypertensive renal disease; Idiopathic peripheral autonomic neuropathy; Mixed hyperlipidemia; Neuropathy; Proteinuria; and Raised prostate specific antigen on their problem list.   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 Discussed at length. Will perform a thorough evaluation to ensure not other causes identified: MRI lumbar and cervical spine in 2022 did not show any etiology. 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  No acute abnormalities noted.    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 is more advanced for age will repeat for progression:  MRI of the brain and MRA of the brain  Carotid Doppler  Blood work today Emg/ncs lower extremities(check for myositis/myopathy) Then consider trying a small dose of sinemet even though not parkinson's disease, vascular parkinsonism may respond Discussed chronic microvascular ischemic changes of the brain due to his multiple uncontrolled vascular risk factors  Patients with vascular parkinsonism might have the same symptoms as those with idiopathic Parkinson's disease, although it often involves the lower more than the upper part of the body ("lower body parkinsonism").. There may also be additional residual signs and symptoms (often asymmetric), from previous strokes such as limb weakness, numbness, abnormal reflexes or abnormal speech. Computerized tomography (CT) or magnetic resonance imaging (MRI) of the brain are likely to be abnormal in 90-100 percent of cases of vascular parkinsonism, often showing multiple small strokes in the deep portions of the brain (white matter changes). If a patient has never been evaluated for the treatment of stroke risk factors, such work up will be needed. This might include additional blood tests, evaluation for possible heart disease and narrowing of the blood vessels of the head or neck.    Orders Placed This Encounter  Procedures   MR BRAIN WO CONTRAST   MR ANGIO HEAD WO CONTRAST   CK   B12 and Folate Panel   Methylmalonic acid, serum   Vitamin B1   Vitamin B6   Multiple Myeloma Panel (SPEP&IFE w/QIG)   NCV with EMG(electromyography)   VAS US CAROTID   No orders of the defined types were placed in this encounter.   Cc: Brian Raider, NP,   Brian Stabile, MD  Naomie Dean, MD  [facility] [address] [place], [name] [address]  Phone [phone] Fax [phone]  I spent more than 70 minutes of face-to-face and non-face-to-face time with patient on the  1. Muscle weakness   2. Falls   3. Gait abnormality   4. Parkinsonism, unspecified Parkinsonism type (HCC)   5. History of multiple strokes   6. Leg weakness, bilateral   7. Shuffling gait   8. Cerebral infarction, unspecified mechanism (HCC)     diagnosis.  This included previsit chart review, lab review, study review, order entry, electronic health record documentation, patient education on the different diagnostic and therapeutic options, counseling and coordination of care, risks and benefits of management, compliance, or risk factor reduction

## 2023-09-06 ENCOUNTER — Telehealth: Payer: Self-pay | Admitting: Neurology

## 2023-09-06 NOTE — Telephone Encounter (Signed)
Orders sent to [facility], they will schedule pt. [phone]

## 2023-09-07 LAB — MULTIPLE MYELOMA PANEL, SERUM
Albumin SerPl Elph-Mcnc: 3.9 g/dL (ref 2.9–4.4)
Albumin/Glob SerPl: 1.4 (ref 0.7–1.7)
Alpha 1: 0.2 g/dL (ref 0.0–0.4)
Alpha2 Glob SerPl Elph-Mcnc: 0.7 g/dL (ref 0.4–1.0)
B-Globulin SerPl Elph-Mcnc: 0.9 g/dL (ref 0.7–1.3)
Gamma Glob SerPl Elph-Mcnc: 1.1 g/dL (ref 0.4–1.8)
Globulin, Total: 3 g/dL (ref 2.2–3.9)
IgA/Immunoglobulin A, Serum: 223 mg/dL (ref 61–437)
IgG (Immunoglobin G), Serum: 1273 mg/dL (ref 603–1613)
IgM (Immunoglobulin M), Srm: 63 mg/dL (ref 15–143)
Total Protein: 6.9 g/dL (ref 6.0–8.5)

## 2023-09-07 LAB — VITAMIN B1: Thiamine: 139.6 nmol/L (ref 66.5–200.0)

## 2023-09-07 LAB — METHYLMALONIC ACID, SERUM: Methylmalonic Acid: 190 nmol/L (ref 0–378)

## 2023-09-07 LAB — CK: Total CK: 45 U/L (ref 41–331)

## 2023-09-07 LAB — B12 AND FOLATE PANEL
Folate: 12.4 ng/mL (ref >3.0)
Vitamin B-12: 769 pg/mL (ref 232–1245)

## 2023-09-07 LAB — VITAMIN B6: Vitamin B6: 16.6 ug/L (ref 3.4–65.2)

## 2023-09-07 NOTE — Telephone Encounter (Signed)
**  Patient returned my call**  Oral Oncology Patient Advocate Encounter  Reached out and spoke with patient regarding PAP paperwork, explained that I would send it to their preferred email via [employer].   Confirmed email address: [email or web address].    Patient expressed understanding and consent.  Will follow up once paperwork has been signed and returned.   Ardeen Fillers, [name] Oncology Pharmacy Patient Advocate  [facility]  [phone] (phone) [phone] (fax) 09/07/2023 9:59 AM

## 2023-09-30 ENCOUNTER — Ambulatory Visit
Admission: RE | Admit: 2023-09-30 | Discharge: 2023-09-30 | Disposition: A | Discharge status: Home / Self Care | Payer: Medicare Other | Source: Ambulatory Visit | Attending: Neurology | Admitting: Neurology

## 2023-09-30 DIAGNOSIS — M6281 Muscle weakness (generalized): Secondary | ICD-10-CM

## 2023-09-30 DIAGNOSIS — R29898 Other symptoms and signs involving the musculoskeletal system: Secondary | ICD-10-CM | POA: Diagnosis not present

## 2023-09-30 DIAGNOSIS — R269 Unspecified abnormalities of gait and mobility: Secondary | ICD-10-CM

## 2023-09-30 DIAGNOSIS — G20C Parkinsonism, unspecified: Secondary | ICD-10-CM

## 2023-09-30 DIAGNOSIS — R2689 Other abnormalities of gait and mobility: Secondary | ICD-10-CM

## 2023-09-30 DIAGNOSIS — R296 Repeated falls: Secondary | ICD-10-CM

## 2023-09-30 DIAGNOSIS — Z8673 Personal history of transient ischemic attack (TIA), and cerebral infarction without residual deficits: Secondary | ICD-10-CM | POA: Diagnosis not present

## 2023-09-30 DIAGNOSIS — I639 Cerebral infarction, unspecified: Secondary | ICD-10-CM | POA: Diagnosis not present

## 2023-10-03 DIAGNOSIS — H524 Presbyopia: Secondary | ICD-10-CM | POA: Diagnosis not present

## 2023-10-03 DIAGNOSIS — H43813 Vitreous degeneration, bilateral: Secondary | ICD-10-CM | POA: Diagnosis not present

## 2023-10-03 DIAGNOSIS — Z961 Presence of intraocular lens: Secondary | ICD-10-CM | POA: Diagnosis not present

## 2023-10-03 DIAGNOSIS — H52203 Unspecified astigmatism, bilateral: Secondary | ICD-10-CM | POA: Diagnosis not present

## 2023-10-03 DIAGNOSIS — E113393 Type 2 diabetes mellitus with moderate nonproliferative diabetic retinopathy without macular edema, bilateral: Secondary | ICD-10-CM | POA: Diagnosis not present

## 2023-11-04 DIAGNOSIS — E782 Mixed hyperlipidemia: Secondary | ICD-10-CM | POA: Diagnosis not present

## 2023-11-04 DIAGNOSIS — E1140 Type 2 diabetes mellitus with diabetic neuropathy, unspecified: Secondary | ICD-10-CM | POA: Diagnosis not present

## 2023-11-04 DIAGNOSIS — D696 Thrombocytopenia, unspecified: Secondary | ICD-10-CM | POA: Diagnosis not present

## 2023-11-07 ENCOUNTER — Other Ambulatory Visit (HOSPITAL_COMMUNITY): Payer: Self-pay

## 2023-11-07 NOTE — Telephone Encounter (Signed)
 Patient called and stated that they did not receive initial email from [employer]. I verified email address with patient and resent to [email or web address]. I will submit once back in hand. I will continue to follow and update until final determination.    Morene Potters, [name] Oncology Pharmacy Patient Advocate  [facility]  [phone] (phone) [phone] (fax) 11/07/2023 11:15 AM

## 2023-11-09 ENCOUNTER — Other Ambulatory Visit (HOSPITAL_COMMUNITY): Payer: Self-pay

## 2023-11-09 NOTE — Telephone Encounter (Signed)
 Oral Oncology Patient Advocate Encounter   Submitted application for assistance for Promacta  to [employer].   Application submitted via e-fax to [phone]   [employer]PA[employer]'s phone number [phone].   I will continue to check the status until final determination.    Hansel Ley, [name] Oncology Pharmacy Patient Advocate  [facility]  [phone] (phone) [phone] (fax) 11/09/2023 10:39 AM

## 2023-11-10 DIAGNOSIS — R809 Proteinuria, unspecified: Secondary | ICD-10-CM | POA: Diagnosis not present

## 2023-11-10 DIAGNOSIS — G20C Parkinsonism, unspecified: Secondary | ICD-10-CM | POA: Diagnosis not present

## 2023-11-10 DIAGNOSIS — D696 Thrombocytopenia, unspecified: Secondary | ICD-10-CM | POA: Diagnosis not present

## 2023-11-10 DIAGNOSIS — L853 Xerosis cutis: Secondary | ICD-10-CM | POA: Diagnosis not present

## 2023-11-10 DIAGNOSIS — G4733 Obstructive sleep apnea (adult) (pediatric): Secondary | ICD-10-CM | POA: Diagnosis not present

## 2023-11-10 DIAGNOSIS — I129 Hypertensive chronic kidney disease with stage 1 through stage 4 chronic kidney disease, or unspecified chronic kidney disease: Secondary | ICD-10-CM | POA: Diagnosis not present

## 2023-11-10 DIAGNOSIS — N1831 Chronic kidney disease, stage 3a: Secondary | ICD-10-CM | POA: Diagnosis not present

## 2023-11-10 DIAGNOSIS — E1140 Type 2 diabetes mellitus with diabetic neuropathy, unspecified: Secondary | ICD-10-CM | POA: Diagnosis not present

## 2023-11-10 DIAGNOSIS — G9009 Other idiopathic peripheral autonomic neuropathy: Secondary | ICD-10-CM | POA: Diagnosis not present

## 2023-11-10 DIAGNOSIS — I10 Essential (primary) hypertension: Secondary | ICD-10-CM | POA: Diagnosis not present

## 2023-11-10 DIAGNOSIS — E782 Mixed hyperlipidemia: Secondary | ICD-10-CM | POA: Diagnosis not present

## 2023-11-14 NOTE — Telephone Encounter (Signed)
Oral Oncology Patient Advocate Encounter   Received notification that the application for assistance for Promacta through [employer] has been denied due to Household Income Exceeds Program Limits.   [employer]PA[employer]'s phone number [phone].   Ardeen Fillers, [name] Oncology Pharmacy Patient Advocate  [facility]  [phone] (phone) [phone] (fax) 11/14/2023 1:17 PM

## 2023-11-17 ENCOUNTER — Other Ambulatory Visit (HOSPITAL_COMMUNITY): Payer: Self-pay

## 2023-11-17 NOTE — Telephone Encounter (Signed)
Called and left VM for patient to return my call to go over initial denial of PAP re-enrollment and to see if patient's income has changed since 2023 as [employer] has lowered their income limits for 2025. I will continue to reach out to patient.   Call 1 - 11/17/23   Ardeen Fillers, [name] Oncology Pharmacy Patient Advocate  [facility]  [phone] (phone) [phone] (fax) 11/17/2023 10:02 AM

## 2023-11-17 NOTE — Telephone Encounter (Signed)
Patient returned my call. Income Limit for [employer] for 2025 with a Household of two is $80,000.00. Patient indicated that he may be slightly over that limit with his 2024 tax return. Patient will figure out income in the next 2 weeks and let me know the outcome. We may have to sign up for Medicare Prescription Payment Plan in order for patient to obtain Promacta for this year. I will continue to follow and update until final determination.    Ardeen Fillers, [name] Oncology Pharmacy Patient Advocate  [facility]  [phone] (phone) [phone] (fax) 11/17/2023 11:41 AM

## 2023-11-21 ENCOUNTER — Ambulatory Visit: Payer: Medicare Other

## 2023-11-21 ENCOUNTER — Ambulatory Visit (INDEPENDENT_AMBULATORY_CARE_PROVIDER_SITE_OTHER): Payer: Medicare Other | Admitting: Neurology

## 2023-11-21 DIAGNOSIS — M6281 Muscle weakness (generalized): Secondary | ICD-10-CM

## 2023-11-21 DIAGNOSIS — R296 Repeated falls: Secondary | ICD-10-CM

## 2023-11-21 DIAGNOSIS — R29898 Other symptoms and signs involving the musculoskeletal system: Secondary | ICD-10-CM

## 2023-11-21 DIAGNOSIS — R269 Unspecified abnormalities of gait and mobility: Secondary | ICD-10-CM

## 2023-11-21 NOTE — Progress Notes (Unsigned)
Full Name: Brian [name] Gender: Male MRN #: [id number] Date of Birth: 10-05-1941    Visit Date: 11/21/2023 14:19 Age: 83 Years Examining Physician: Dr. Naomie Dean Referring Physician: Dr. Naomie Dean Height: 5 feet 11 inch  History: Patient with weakness in the legs. Here for evaluation of muscle disease, workup to date including labwork, imaging of the spine and brain, CK have been unrevealing.  Discussed physical therapy with patient to help with strengthening and gait abnormality which is likely multifactorial (see prior note) and we will send him to [facility]: Gait abnormality, leg weakness, needs balance therapy, strengthening exercises, fall prevention and any other modality as waranted.  Orders Placed This Encounter  Procedures   Ambulatory referral to Physical Therapy      Summary: NCS were performed on the bilateral extremities.  The left peroneal motor nerve showed reduced amplitude (1.4 mV, normal greater than 2), decreased conduction velocity (fib head to ankle, 38 m/s, normal greater than 44) and decreased conduction velocity (41 m/s, normal greater than 44).  The right peroneal motor nerve showed reduced amplitude (1.4 mV, normal greater than 2), decreased conduction velocity (fib head to ankle, 38 m/s, normal greater than 44) and decreased conduction velocity (42 m/s, normal greater than 44).  The left tibial motor nerve showed reduced amplitude (2.6 mV, normal greater than 4) and decreased conduction velocity (36 m/s, normal greater than 41.The left tibial motor nerve showed reduced amplitude (2.06 mV, normal greater than 4) and decreased conduction velocity (39 m/s, normal greater than 41.  The left tibial F wave showed delayed latency (60.7 ms, normal less than 56).  The right tibial F wave showed delayed latency (63.1 ms, normal less than 56).  The left sural, right sural, left superficial peroneal and right superficial peroneal sensory nerves all showed no  response.All remaining nerves (as indicated in the following tables) were within normal limits.  EMG needle study was performed on the right lower extremity.muscles (as indicated in the following tables) were within normal limits.        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 No suggestion of myopathy/myositis or other muscle disease or radiculopathy.      ------------------------------- Naomie Dean, M.D.  [facility] [address], [address] [name], [name] [address] Tel: [phone] Fax: [phone]  Verbal informed consent was obtained from the patient, patient was informed of potential risk of procedure, including bruising, bleeding, hematoma formation, infection, muscle weakness, muscle pain, numbness, among others.        MNC    Nerve / Sites Muscle Latency Ref. Amplitude Ref. Rel Amp Segments Distance Velocity Ref. Area    ms ms mV mV %  cm m/s m/s mVms  L Peroneal - EDB     Ankle EDB 5.1 <=6.5 1.4 >=2.0 100 Ankle - EDB 9   3.3     Fib head EDB 13.4  1.0  70.4 Fib head - Ankle 31 38 >=44 2.4     Pop fossa EDB 16.0  0.9  94.4 Pop fossa - Fib head 11 41 >=44 2.3         Pop fossa - Ankle      R Peroneal - EDB     Ankle EDB 5.5 <=6.5 1.4 >=2.0 100 Ankle - EDB 9   4.4     Fib head EDB 12.8  1.4  98.6 Fib head - Ankle 27.6 38 >=44 3.4  Pop fossa EDB 15.4  1.5  111 Pop fossa - Fib head 11 42 >=44 4.1         Pop fossa - Ankle      L Tibial - AH     Ankle AH 4.8 <=5.8 2.6 >=4.0 100 Ankle - AH 9   8.6     Pop fossa AH 16.1  1.4  53.5 Pop fossa - Ankle 40.6 36 >=41 4.1  R Tibial - AH     Ankle AH 4.8 <=5.8 2.0 >=4.0 100 Ankle - AH 9   8.3     Pop fossa AH 15.1  1.3  63.7 Pop fossa - Ankle 40 39 >=41 7.5             SNC    Nerve / Sites Rec. Site Peak Lat Ref.  Amp Ref. Segments Distance    ms ms V V  cm  L Sural - Ankle (Calf)     Calf Ankle NR <=4.4 NR >=6 Calf - Ankle 14  R Sural - Ankle (Calf)      Calf Ankle NR <=4.4 NR >=6 Calf - Ankle 14  L Superficial peroneal - Ankle     Lat leg Ankle NR <=4.4 NR >=6 Lat leg - Ankle 14  R Superficial peroneal - Ankle     Lat leg Ankle NR <=4.4 NR >=6 Lat leg - Ankle 14             F  Wave    Nerve F Lat Ref.   ms ms  L Tibial - AH 60.7 <=56.0  R Tibial - AH 63.1 <=56.0         EMG Summary Table    Spontaneous MUAP Recruitment  Muscle IA Fib PSW Fasc Other Amp Dur. Poly Pattern  R. Iliopsoas Normal None None None _______ Normal Normal Normal Normal  R. Vastus medialis Normal None None None _______ Normal Normal Normal Normal  R. Tibialis anterior Normal None None None _______ Normal Normal Normal Normal  R. Gastrocnemius (Medial head) Normal None None None _______ Normal Normal Normal Normal  R. Extensor hallucis longus Normal None None None _______ Normal Normal Normal Normal  R. Biceps femoris (long head) Normal None None None _______ Normal Normal Normal Normal  R. Gluteus maximus Normal None None None _______ Normal Normal Normal Normal  R. Gluteus medius Normal None None None _______ Normal Normal Normal Normal  R. Lumbar paraspinals (low) Normal None None None _______ Normal Normal Normal Normal  R. Thoracic paraspinals (mid) Normal None None None _______ Normal Normal Normal Normal

## 2023-11-22 NOTE — Procedures (Signed)
Full Name: Brian [name] Gender: Male MRN #: [id number] Date of Birth: [birth year]    Visit Date: 11/21/2023 14:19 Age: 83 Years Examining Physician: Dr. Naomie Dean Referring Physician: Dr. Naomie Dean Height: 5 feet 11 inch  History: Patient with weakness in the legs. Here for evaluation of muscle disease, workup to date including labwork, imaging of the spine and brain, CK have been unrevealing.  Discussed physical therapy with patient to help with strengthening and gait abnormality which is likely multifactorial (see prior note) and we will send him to [facility]: Gait abnormality, leg weakness, needs balance therapy, strengthening exercises, fall prevention and any other modality as waranted.  Orders Placed This Encounter  Procedures   Ambulatory referral to Physical Therapy      Summary: NCS were performed on the bilateral extremities.  The left peroneal motor nerve showed reduced amplitude (1.4 mV, normal greater than 2), decreased conduction velocity (fib head to ankle, 38 m/s, normal greater than 44) and decreased conduction velocity (41 m/s, normal greater than 44).  The right peroneal motor nerve showed reduced amplitude (1.4 mV, normal greater than 2), decreased conduction velocity (fib head to ankle, 38 m/s, normal greater than 44) and decreased conduction velocity (42 m/s, normal greater than 44).  The left tibial motor nerve showed reduced amplitude (2.6 mV, normal greater than 4) and decreased conduction velocity (36 m/s, normal greater than 41.The left tibial motor nerve showed reduced amplitude (2.06 mV, normal greater than 4) and decreased conduction velocity (39 m/s, normal greater than 41.  The left tibial F wave showed delayed latency (60.7 ms, normal less than 56).  The right tibial F wave showed delayed latency (63.1 ms, normal less than 56).  The left sural, right sural, left superficial peroneal and right superficial peroneal sensory nerves all showed no  response.All remaining nerves (as indicated in the following tables) were within normal limits.  EMG needle study was performed on the right lower extremity.muscles (as indicated in the following tables) were within normal limits.        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 No suggestion of myopathy/myositis or other muscle disease or radiculopathy.      ------------------------------- Naomie Dean, M.D.  [facility] [address], [address] [place], [name] [address] Tel: [phone] Fax: [phone]  Verbal informed consent was obtained from the patient, patient was informed of potential risk of procedure, including bruising, bleeding, hematoma formation, infection, muscle weakness, muscle pain, numbness, among others.        MNC    Nerve / Sites Muscle Latency Ref. Amplitude Ref. Rel Amp Segments Distance Velocity Ref. Area    ms ms mV mV %  cm m/s m/s mVms  L Peroneal - EDB     Ankle EDB 5.1 <=6.5 1.4 >=2.0 100 Ankle - EDB 9   3.3     Fib head EDB 13.4  1.0  70.4 Fib head - Ankle 31 38 >=44 2.4     Pop fossa EDB 16.0  0.9  94.4 Pop fossa - Fib head 11 41 >=44 2.3         Pop fossa - Ankle      R Peroneal - EDB     Ankle EDB 5.5 <=6.5 1.4 >=2.0 100 Ankle - EDB 9   4.4     Fib head EDB 12.8  1.4  98.6 Fib head - Ankle 27.6 38 >=44 3.4  Pop fossa EDB 15.4  1.5  111 Pop fossa - Fib head 11 42 >=44 4.1         Pop fossa - Ankle      L Tibial - AH     Ankle AH 4.8 <=5.8 2.6 >=4.0 100 Ankle - AH 9   8.6     Pop fossa AH 16.1  1.4  53.5 Pop fossa - Ankle 40.6 36 >=41 4.1  R Tibial - AH     Ankle AH 4.8 <=5.8 2.0 >=4.0 100 Ankle - AH 9   8.3     Pop fossa AH 15.1  1.3  63.7 Pop fossa - Ankle 40 39 >=41 7.5             SNC    Nerve / Sites Rec. Site Peak Lat Ref.  Amp Ref. Segments Distance    ms ms V V  cm  L Sural - Ankle (Calf)     Calf Ankle NR <=4.4 NR >=6 Calf - Ankle 14  R Sural - Ankle (Calf)      Calf Ankle NR <=4.4 NR >=6 Calf - Ankle 14  L Superficial peroneal - Ankle     Lat leg Ankle NR <=4.4 NR >=6 Lat leg - Ankle 14  R Superficial peroneal - Ankle     Lat leg Ankle NR <=4.4 NR >=6 Lat leg - Ankle 14             F  Wave    Nerve F Lat Ref.   ms ms  L Tibial - AH 60.7 <=56.0  R Tibial - AH 63.1 <=56.0         EMG Summary Table    Spontaneous MUAP Recruitment  Muscle IA Fib PSW Fasc Other Amp Dur. Poly Pattern  R. Iliopsoas Normal None None None _______ Normal Normal Normal Normal  R. Vastus medialis Normal None None None _______ Normal Normal Normal Normal  R. Tibialis anterior Normal None None None _______ Normal Normal Normal Normal  R. Gastrocnemius (Medial head) Normal None None None _______ Normal Normal Normal Normal  R. Extensor hallucis longus Normal None None None _______ Normal Normal Normal Normal  R. Biceps femoris (long head) Normal None None None _______ Normal Normal Normal Normal  R. Gluteus maximus Normal None None None _______ Normal Normal Normal Normal  R. Gluteus medius Normal None None None _______ Normal Normal Normal Normal  R. Lumbar paraspinals (low) Normal None None None _______ Normal Normal Normal Normal  R. Thoracic paraspinals (mid) Normal None None None _______ Normal Normal Normal Normal

## 2023-11-22 NOTE — Progress Notes (Signed)
See procedure note.

## 2023-11-29 NOTE — Therapy (Signed)
 OUTPATIENT PHYSICAL THERAPY LOWER EXTREMITY EVALUATION   Patient Name: AMIRE LEAZER MRN: [id number] DOB:June 12, 1941, 83 y.o., male Today's Date: 11/30/2023  END OF SESSION:  PT End of Session - 11/30/23 1455     Visit Number 1    Number of Visits 12    Date for PT Re-Evaluation 01/11/24    Authorization Type medicare    Progress Note Due on Visit 10    PT Start Time 1345    PT Stop Time 1428    PT Time Calculation (min) 43 min    Behavior During Therapy [facility] for tasks assessed/performed             Past Medical History:  Diagnosis Date   Arthritis    Atypical chest pain    Low-risk exercise stress Cardiolite EF 60% April 2008. Reportedly normal remote cardiac catheterization @ [facility] EF 60%...nuclear....2008   Bradycardia    hospital 03/2010 stopped beta blocker   Complication of anesthesia    Diabetes mellitus without complication (HCC)    Dyslipidemia    Fatigue    Hypertension    Mild aortic regurgitation with LV dilation by prior echocardiogram    Neuropathy    in feet   Obesity    Obstructive sleep apnea    C-Pap compliant   PONV (postoperative nausea and vomiting)    Thrombocytopenia (HCC)    mild follow Dr. Joe   [facility] 03/2010   Past Surgical History:  Procedure Laterality Date   CARDIAC CATHETERIZATION     KNEE ARTHROSCOPY WITH MEDIAL MENISECTOMY Left 05/31/2014   Procedure: LEFT KNEE ARTHROSCOPY WITH MEDIAL MENISECTOMY;  Surgeon: Taft FORBES Minerva, MD;  Location: [facility];  Service: Orthopedics;  Laterality: Left;   Status post bilateral arthroscopic knee surgery     TOTAL KNEE ARTHROPLASTY Left 04/14/2016   Procedure: LEFT TOTAL KNEE ARTHROPLASTY;  Surgeon: Taft FORBES Minerva, MD;  Location: [facility];  Service: Orthopedics;  Laterality: Left;   Patient Active Problem List   Diagnosis Date Noted   Chronic insomnia 03/16/2022   Fatigue 03/14/2022   Onychomycosis of toenail 12/30/2021   Abrasion of skin of right lower leg 12/30/2021    Chronic kidney disease, stage 2 (mild) 09/01/2021   History of diabetes mellitus 05/07/2021   Ataxia 05/07/2021   Diabetic peripheral neuropathy (HCC) 05/07/2021   Dry skin dermatitis 05/07/2021   Furuncle 05/07/2021   Hypertensive renal disease 05/07/2021   Idiopathic peripheral autonomic neuropathy 05/07/2021   Proteinuria 05/07/2021   Raised prostate specific antigen 05/07/2021   Mixed hyperlipidemia 05/04/2021   Diabetes mellitus (HCC) 03/16/2021   Neuropathy 03/16/2021   ETD (Eustachian tube dysfunction), bilateral 01/10/2018   Bilateral impacted cerumen 07/19/2017   Neck pain on right side 07/19/2017   Otalgia of right ear 07/19/2017   Tendonitis, Achilles, right 08/27/2014   Stiffness of joint, not elsewhere classified, lower leg 06/11/2014   Pain in joint, lower leg 06/11/2014   Abnormality of gait 06/11/2014   S/P total knee replacement, 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 06/03/2014   Derangement of posterior horn of medial meniscus 04/09/2014   OA (osteoarthritis) of knee 04/09/2014   Knee pain 04/09/2014   THROMBOCYTOPENIA 05/12/2010   AORTIC INSUFFICIENCY 05/12/2010   OBESITY, UNSPECIFIED 05/11/2010   HYPERTENSION 05/11/2010   BRADYCARDIA 05/11/2010   VERTIGO 05/11/2010   Sleep apnea 05/11/2010   CHEST PAIN UNSPECIFIED 05/11/2010    PCP: Norleen Hurst  REFERRING PROVIDER: Ines Onetha NOVAK, MD  REFERRING DIAG:  Diagnosis  M62.81 (ICD-10-CM) -  Muscle weakness  R29.6 (ICD-10-CM) - Falls  R26.9 (ICD-10-CM) - Gait abnormality  R29.898 (ICD-10-CM) - Weakness of both lower extremities    THERAPY DIAG:  Diagnosis  M62.81 (ICD-10-CM) - Muscle weakness  R29.6 (ICD-10-CM) - Falls  R26.9 (ICD-10-CM) - Gait abnormality  R29.898 (ICD-10-CM) - Weakness of both lower extremities    Rationale for Evaluation and Treatment: Rehabilitation  ONSET DATE: chronic in nature but progressing   SUBJECTIVE:   SUBJECTIVE STATEMENT: Mr. Stracener states that his legs have been feeling weak for a year  now.  He has been using a bike and exercising with weights but it seems to be getting worse.    Mr. Granville state that he can't stand for any length, of time before his legs give out.  After standing five minutes due to his leg weakness he will start swaying from imbalance. His legs just fold he fell over. No tremors. They just give out. The pt can walk for about 15 minutes.    PERTINENT HISTORY: Peripheral neuropathy, DM, TKR, OA PAIN:  Are you having pain? No  PRECAUTIONS: Fall     WEIGHT BEARING RESTRICTIONS: no  FALLS:  Has patient fallen in last 6 months? Yes. Number of falls 2x  LIVING ENVIRONMENT: Lives with: lives with their family Lives in: House/apartment Stairs: Yes: Internal: 10 steps; on right going up and External: 3 steps; on right going up;  pt has to go up and down the steps one at a time.  Has following equipment at home: Quad cane small base  OCCUPATION: retired  PLOF: Independent  PATIENT GOALS: Pt wants to strengthen his legs and get rid of his cane   NEXT MD VISIT: following therapy  OBJECTIVE:  Note: Objective measures were completed at Evaluation unless otherwise noted.  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 No suggestion of myopathy/myositis or other muscle disease or radiculopathy.      COGNITION: Overall cognitive status: Within functional limits for tasks assessed    POSTURE: rounded shoulders, forward head, decreased lumbar lordosis, decreased thoracic kyphosis, and flexed trunk    LOWER EXTREMITY ROM:  Active ROM Right eval Left eval  Hip flexion    Hip extension    Hip abduction    Hip adduction    Hip internal rotation    Hip external rotation    Knee flexion    Knee extension    Ankle dorsiflexion    Ankle plantarflexion    Ankle inversion    Ankle eversion     (Blank rows = not tested)  LOWER EXTREMITY MMT:  MMT Right eval Left eval  Hip  flexion 5 5  Hip extension 3+ 3+  Hip abduction 5 5  Hip adduction    Hip internal rotation    Hip external rotation    Knee flexion 4- 5  Knee extension 5 5  Ankle dorsiflexion 5- 5-  Ankle plantarflexion    Ankle inversion    Ankle eversion     (Blank rows = not tested)  FUNCTIONAL TESTS:  30 seconds chair stand test:  6:  poor for pt age and sex  2 minute walk test: 238 ft no AD, no trunk rotation, no arm swing no ankle motion limited knee motion   Single leg stance:  7 Lt ; 5: RT  TREATMENT DATE: 11/30/23  Evaluation  Education on finding  Sit to stand x 10  Single leg stance x 3 reps on Rt and LT  Bridge x 10    PATIENT EDUCATION:  Education details: Except for glut max pt has limited weakness in LE, Gait is unsteady due to decrease trunk and arm motion as well as balance.  Treatment will focus mainly on this.  Person educated: Patient Education method: Explanation, Verbal cues, and Handouts Education comprehension: verbalized understanding  HOME EXERCISE PROGRAM: Access Code: [email or web address] URL: [email or web address] Date: 11/30/2023 Prepared by: Montie Metro  Exercises - Sit to Stand  - 3 x daily - 7 x weekly - 1 sets - 10 reps - Supine Bridge  - 2 x daily - 7 x weekly - 1 sets - 10 reps - 5-10 seconds  hold - Standing Single Leg Stance with Counter Support  - 2 x daily - 7 x weekly - 1 sets - 5 reps - as long as possible  hold  ASSESSMENT:  CLINICAL IMPRESSION: Patient is a 83 y.o. male who was seen today for physical therapy evaluation and treatment for gait abnormality, falls and weakness.  Evaluation demonstrates decreased balance, abnormal gait with decreased body motion with gait, decreased strength and history of falling.  Mr. Panebianco will benefit from skilled PT to address these issues, improve his gait and mobility  to improve his functional ability.    OBJECTIVE IMPAIRMENTS: Abnormal gait, decreased activity tolerance, decreased balance, difficulty walking, decreased strength, dizziness, postural dysfunction, and obesity.   ACTIVITY LIMITATIONS: carrying, lifting, stairs, and locomotion level  PARTICIPATION LIMITATIONS: shopping and community activity   REHAB POTENTIAL: Good  CLINICAL DECISION MAKING: Evolving/moderate complexity  EVALUATION COMPLEXITY: Moderate   GOALS: Goals reviewed with patient? No  SHORT TERM GOALS: Target date: 12/21/23 PT to be I in HEP in order to increase gluteal strength to be able to complete 9 sit to stand in 30 seconds, (below average for age) Baseline: Goal status: INITIAL  2.  Pt to be able to single leg stance with Rt and LT LE x 15 to decrease risk of falling.  Baseline:  Goal status: INITIAL  3.  Pt to be demonstrating some ankle and arm motion while walking  Baseline:  Goal status: INITIAL   LONG TERM GOALS: Target date: 01/11/24  PT to be I in an advanced  HEP in order to improve strength  to be able to complete 11 sit to stand in 30 seconds, ( average for age) Baseline:  Goal status: INITIAL  2.  PT gait to be improved to be able to ambulate 300 ft in a 2 minute period of time  Baseline:  Goal status: INITIAL  3.  PT to be able to single leg stance x 20 seconds on each LE to decrease risk of falls.  Baseline:  Goal status: INITIAL    PLAN:  PT FREQUENCY: 2x/week  PT DURATION: 6 weeks  PLANNED INTERVENTIONS: 97110-Therapeutic exercises, 97530- Therapeutic activity, [id number]- Neuromuscular re-education, 97535- Self Care, 02859- Manual therapy, and 97116- Gait training  PLAN FOR NEXT SESSION: begin POWER moves, heel toe gait training adding arm swings, balance and glut max strengthening   Montie Metro, PT CLT [phone]  11/30/2023, 2:56 PM

## 2023-11-30 ENCOUNTER — Ambulatory Visit (HOSPITAL_COMMUNITY): Payer: Medicare Other | Attending: Neurology | Admitting: Physical Therapy

## 2023-11-30 ENCOUNTER — Other Ambulatory Visit: Payer: Self-pay

## 2023-11-30 DIAGNOSIS — R296 Repeated falls: Secondary | ICD-10-CM | POA: Insufficient documentation

## 2023-11-30 DIAGNOSIS — R29898 Other symptoms and signs involving the musculoskeletal system: Secondary | ICD-10-CM | POA: Diagnosis not present

## 2023-11-30 DIAGNOSIS — R262 Difficulty in walking, not elsewhere classified: Secondary | ICD-10-CM | POA: Insufficient documentation

## 2023-11-30 DIAGNOSIS — R269 Unspecified abnormalities of gait and mobility: Secondary | ICD-10-CM | POA: Insufficient documentation

## 2023-11-30 DIAGNOSIS — M6281 Muscle weakness (generalized): Secondary | ICD-10-CM | POA: Diagnosis not present

## 2023-12-07 ENCOUNTER — Encounter (HOSPITAL_BASED_OUTPATIENT_CLINIC_OR_DEPARTMENT_OTHER): Payer: Medicare Other

## 2023-12-08 ENCOUNTER — Encounter (HOSPITAL_COMMUNITY): Payer: Medicare Other | Admitting: Physical Therapy

## 2023-12-12 ENCOUNTER — Other Ambulatory Visit (HOSPITAL_COMMUNITY): Payer: Self-pay

## 2023-12-12 ENCOUNTER — Encounter (HOSPITAL_COMMUNITY): Payer: Medicare Other | Admitting: Physical Therapy

## 2023-12-12 NOTE — Telephone Encounter (Signed)
Oral Oncology Patient Advocate Encounter  Spoke with pt about his income, gave him the option of doing the medicare payment plan or paying for the full amount upfront. Pt stated he would prefer to call back with his accurate income information. Gave pt my office number for whenever he has the updated income info. Pt confirmed understanding.   [name] Almedia Balls, [name] Oncology Pharmacy Patient Advocate [facility] Direct Number: [phone] Fax: [phone]

## 2023-12-15 ENCOUNTER — Encounter (HOSPITAL_COMMUNITY): Payer: Medicare Other | Admitting: Physical Therapy

## 2023-12-16 NOTE — Telephone Encounter (Signed)
Patient called back and has his AGI for 2024 done. Patient is within financial limits for PAP Program. Patient will drop off Proof of Income to [facility] on Monday, 12/19/23. Proof of Income will be submitted to [employer] once in hand. I will continue to follow and update until final determination.    Ardeen Fillers, [name] Oncology Pharmacy Patient Advocate  [facility]  [phone] (phone) [phone] (fax) 12/16/2023 12:50 PM

## 2023-12-20 ENCOUNTER — Encounter (HOSPITAL_COMMUNITY): Payer: Medicare Other | Admitting: Physical Therapy

## 2023-12-20 DIAGNOSIS — G4733 Obstructive sleep apnea (adult) (pediatric): Secondary | ICD-10-CM | POA: Diagnosis not present

## 2023-12-21 DIAGNOSIS — I10 Essential (primary) hypertension: Secondary | ICD-10-CM | POA: Diagnosis not present

## 2023-12-21 DIAGNOSIS — I129 Hypertensive chronic kidney disease with stage 1 through stage 4 chronic kidney disease, or unspecified chronic kidney disease: Secondary | ICD-10-CM | POA: Diagnosis not present

## 2023-12-21 DIAGNOSIS — E1165 Type 2 diabetes mellitus with hyperglycemia: Secondary | ICD-10-CM | POA: Diagnosis not present

## 2023-12-21 DIAGNOSIS — E1159 Type 2 diabetes mellitus with other circulatory complications: Secondary | ICD-10-CM | POA: Diagnosis not present

## 2023-12-21 DIAGNOSIS — G20C Parkinsonism, unspecified: Secondary | ICD-10-CM | POA: Diagnosis not present

## 2023-12-21 DIAGNOSIS — G9009 Other idiopathic peripheral autonomic neuropathy: Secondary | ICD-10-CM | POA: Diagnosis not present

## 2023-12-21 DIAGNOSIS — G4733 Obstructive sleep apnea (adult) (pediatric): Secondary | ICD-10-CM | POA: Diagnosis not present

## 2023-12-21 DIAGNOSIS — N1831 Chronic kidney disease, stage 3a: Secondary | ICD-10-CM | POA: Diagnosis not present

## 2023-12-21 DIAGNOSIS — F5104 Psychophysiologic insomnia: Secondary | ICD-10-CM | POA: Diagnosis not present

## 2023-12-21 DIAGNOSIS — R270 Ataxia, unspecified: Secondary | ICD-10-CM | POA: Diagnosis not present

## 2023-12-21 DIAGNOSIS — E1169 Type 2 diabetes mellitus with other specified complication: Secondary | ICD-10-CM | POA: Diagnosis not present

## 2023-12-22 ENCOUNTER — Encounter (HOSPITAL_COMMUNITY): Payer: Medicare Other | Admitting: Physical Therapy

## 2023-12-22 NOTE — Telephone Encounter (Signed)
 We have not received Proof of Income to send off for patient. Called and left VM for patient to return call to verify that they dropped off income documents at [facility]. I will continue to reach out to patient.   Call 1 - LVM 12/21/23   Ardeen Fillers, [name] Oncology Pharmacy Patient Advocate  [facility]  [phone] (phone) [phone] (fax) 12/22/2023 8:10 AM

## 2023-12-26 NOTE — Telephone Encounter (Signed)
 Oral Oncology Patient Advocate Encounter  Pt called back to inform me that he will be dropping off proof of income tomorrow at [facility]. Stated that he will give me a call once he has done so.  [name] Almedia Balls, [name] Oncology Pharmacy Patient Advocate [facility] Direct Number: [phone] Fax: [phone]

## 2023-12-27 ENCOUNTER — Encounter (HOSPITAL_COMMUNITY): Payer: Medicare Other | Admitting: Physical Therapy

## 2023-12-27 NOTE — Telephone Encounter (Signed)
 Oral Oncology Patient Advocate Encounter  Pt dropped off income information at [facility]. I have submitted income info to [employer] via fax at [phone] on 12/27/2023.  I will continue to check on status of application.  [name] Almedia Balls, [name] Oncology Pharmacy Patient Advocate [facility] Direct Number: [phone] Fax: [phone]

## 2023-12-28 ENCOUNTER — Encounter (HOSPITAL_BASED_OUTPATIENT_CLINIC_OR_DEPARTMENT_OTHER): Payer: Medicare Other

## 2023-12-29 ENCOUNTER — Other Ambulatory Visit (HOSPITAL_COMMUNITY): Payer: Self-pay

## 2023-12-29 ENCOUNTER — Encounter (HOSPITAL_COMMUNITY): Payer: Medicare Other | Admitting: Physical Therapy

## 2023-12-29 NOTE — Telephone Encounter (Signed)
 Oral Oncology Patient Advocate Encounter  Called to check on status of application. Per representative, I have to resubmit application attaching updated income information.   I have faxed over previous application adding in the updated income information.  [employer] Phone number: [phone]            Fax: [phone]  I will continue to check on status of application.  [name] Almedia Balls, [name] Oncology Pharmacy Patient Advocate [facility] Direct Number: [phone] Fax: [phone]

## 2024-01-02 ENCOUNTER — Encounter (HOSPITAL_COMMUNITY): Payer: Medicare Other | Admitting: Physical Therapy

## 2024-01-04 NOTE — Telephone Encounter (Signed)
 Oral Oncology Patient Advocate Encounter   Received notification that the application for assistance for Promacta through [employer] has been approved.   [employer] phone number [phone].   Effective dates: 01/04/2024 through 10/24/2024  Medication will be filled at [facility].   [name] Almedia Balls, [name] Oncology Pharmacy Patient Advocate [facility] Direct Number: [phone] Fax: [phone]

## 2024-01-05 ENCOUNTER — Encounter (HOSPITAL_COMMUNITY): Payer: Medicare Other | Admitting: Physical Therapy

## 2024-01-05 NOTE — Telephone Encounter (Signed)
 Oral Oncology Patient Advocate Encounter  Spoke with patient. He will be calling [employer] to set up delivery.   Pt knows to call me with any question or issues.  [name] Almedia Balls, [name] Oncology Pharmacy Patient Advocate [facility] Direct Number: [phone] Fax: [phone]

## 2024-01-09 ENCOUNTER — Encounter (HOSPITAL_COMMUNITY): Payer: Medicare Other | Admitting: Physical Therapy

## 2024-01-12 ENCOUNTER — Encounter (HOSPITAL_COMMUNITY): Payer: Medicare Other | Admitting: Physical Therapy

## 2024-01-16 ENCOUNTER — Encounter (HOSPITAL_COMMUNITY): Payer: Medicare Other | Admitting: Physical Therapy

## 2024-01-17 ENCOUNTER — Ambulatory Visit: Attending: Neurology

## 2024-01-17 DIAGNOSIS — R296 Repeated falls: Secondary | ICD-10-CM | POA: Diagnosis not present

## 2024-01-17 DIAGNOSIS — G20C Parkinsonism, unspecified: Secondary | ICD-10-CM | POA: Diagnosis not present

## 2024-01-17 DIAGNOSIS — Z8673 Personal history of transient ischemic attack (TIA), and cerebral infarction without residual deficits: Secondary | ICD-10-CM | POA: Insufficient documentation

## 2024-01-17 DIAGNOSIS — M6281 Muscle weakness (generalized): Secondary | ICD-10-CM | POA: Diagnosis not present

## 2024-01-17 DIAGNOSIS — R269 Unspecified abnormalities of gait and mobility: Secondary | ICD-10-CM | POA: Insufficient documentation

## 2024-01-17 DIAGNOSIS — R2689 Other abnormalities of gait and mobility: Secondary | ICD-10-CM | POA: Diagnosis not present

## 2024-01-17 DIAGNOSIS — I6523 Occlusion and stenosis of bilateral carotid arteries: Secondary | ICD-10-CM | POA: Diagnosis not present

## 2024-01-17 DIAGNOSIS — R29898 Other symptoms and signs involving the musculoskeletal system: Secondary | ICD-10-CM | POA: Insufficient documentation

## 2024-01-20 ENCOUNTER — Encounter (HOSPITAL_COMMUNITY): Payer: Medicare Other | Admitting: Physical Therapy

## 2024-01-24 ENCOUNTER — Telehealth: Payer: Self-pay | Admitting: *Deleted

## 2024-01-24 NOTE — Telephone Encounter (Signed)
 I called pt and relayed the carotid US unremarkable, no significant stenosis.  He verbalized understanding.

## 2024-01-24 NOTE — Telephone Encounter (Signed)
-----   Message from Anson Fret sent at 01/18/2024  8:30 AM EDT ----- Carotid ultrasound unremarkable no significant stenosis thanks

## 2024-02-08 DIAGNOSIS — E782 Mixed hyperlipidemia: Secondary | ICD-10-CM | POA: Diagnosis not present

## 2024-02-08 DIAGNOSIS — E1140 Type 2 diabetes mellitus with diabetic neuropathy, unspecified: Secondary | ICD-10-CM | POA: Diagnosis not present

## 2024-02-15 DIAGNOSIS — D649 Anemia, unspecified: Secondary | ICD-10-CM | POA: Diagnosis not present

## 2024-02-15 DIAGNOSIS — G4700 Insomnia, unspecified: Secondary | ICD-10-CM | POA: Diagnosis not present

## 2024-02-15 DIAGNOSIS — I129 Hypertensive chronic kidney disease with stage 1 through stage 4 chronic kidney disease, or unspecified chronic kidney disease: Secondary | ICD-10-CM | POA: Diagnosis not present

## 2024-02-15 DIAGNOSIS — R270 Ataxia, unspecified: Secondary | ICD-10-CM | POA: Diagnosis not present

## 2024-02-15 DIAGNOSIS — G4733 Obstructive sleep apnea (adult) (pediatric): Secondary | ICD-10-CM | POA: Diagnosis not present

## 2024-02-15 DIAGNOSIS — E1169 Type 2 diabetes mellitus with other specified complication: Secondary | ICD-10-CM | POA: Diagnosis not present

## 2024-02-15 DIAGNOSIS — F5104 Psychophysiologic insomnia: Secondary | ICD-10-CM | POA: Diagnosis not present

## 2024-02-15 DIAGNOSIS — I10 Essential (primary) hypertension: Secondary | ICD-10-CM | POA: Diagnosis not present

## 2024-02-15 DIAGNOSIS — G20C Parkinsonism, unspecified: Secondary | ICD-10-CM | POA: Diagnosis not present

## 2024-02-15 DIAGNOSIS — E1122 Type 2 diabetes mellitus with diabetic chronic kidney disease: Secondary | ICD-10-CM | POA: Diagnosis not present

## 2024-02-15 DIAGNOSIS — N1831 Chronic kidney disease, stage 3a: Secondary | ICD-10-CM | POA: Diagnosis not present

## 2024-02-15 DIAGNOSIS — G9009 Other idiopathic peripheral autonomic neuropathy: Secondary | ICD-10-CM | POA: Diagnosis not present

## 2024-02-16 ENCOUNTER — Other Ambulatory Visit: Payer: Self-pay | Admitting: *Deleted

## 2024-02-16 DIAGNOSIS — D693 Immune thrombocytopenic purpura: Secondary | ICD-10-CM

## 2024-02-20 ENCOUNTER — Inpatient Hospital Stay: Attending: Hematology

## 2024-02-20 ENCOUNTER — Inpatient Hospital Stay (HOSPITAL_BASED_OUTPATIENT_CLINIC_OR_DEPARTMENT_OTHER): Admitting: Hematology

## 2024-02-20 VITALS — BP 155/84 | HR 81 | Temp 97.9°F | Resp 16 | Wt 261.7 lb

## 2024-02-20 DIAGNOSIS — D696 Thrombocytopenia, unspecified: Secondary | ICD-10-CM | POA: Insufficient documentation

## 2024-02-20 DIAGNOSIS — Z79899 Other long term (current) drug therapy: Secondary | ICD-10-CM | POA: Diagnosis not present

## 2024-02-20 DIAGNOSIS — D693 Immune thrombocytopenic purpura: Secondary | ICD-10-CM

## 2024-02-20 LAB — COMPREHENSIVE METABOLIC PANEL WITH GFR
ALT: 18 U/L (ref 0–44)
AST: 20 U/L (ref 15–41)
Albumin: 3.8 g/dL (ref 3.5–5.0)
Alkaline Phosphatase: 72 U/L (ref 38–126)
Anion gap: 12 (ref 5–15)
BUN: 22 mg/dL (ref 8–23)
CO2: 20 mmol/L — ABNORMAL LOW (ref 22–32)
Calcium: 9.1 mg/dL (ref 8.9–10.3)
Chloride: 100 mmol/L (ref 98–111)
Creatinine, Ser: 1.43 mg/dL — ABNORMAL HIGH (ref 0.61–1.24)
GFR, Estimated: 50 mL/min — ABNORMAL LOW (ref >60)
Glucose, Bld: 289 mg/dL — ABNORMAL HIGH (ref 70–99)
Potassium: 3.6 mmol/L (ref 3.5–5.1)
Sodium: 132 mmol/L — ABNORMAL LOW (ref 135–145)
Total Bilirubin: 0.7 mg/dL (ref 0.0–1.2)
Total Protein: 7.4 g/dL (ref 6.5–8.1)

## 2024-02-20 LAB — CBC WITH DIFFERENTIAL/PLATELET
Abs Immature Granulocytes: 0.03 10*3/uL (ref 0.00–0.07)
Basophils Absolute: 0 10*3/uL (ref 0.0–0.1)
Basophils Relative: 0 %
Eosinophils Absolute: 0.4 10*3/uL (ref 0.0–0.5)
Eosinophils Relative: 6 %
HCT: 39.5 % (ref 39.0–52.0)
Hemoglobin: 13.7 g/dL (ref 13.0–17.0)
Immature Granulocytes: 1 %
Lymphocytes Relative: 24 %
Lymphs Abs: 1.4 10*3/uL (ref 0.7–4.0)
MCH: 32.2 pg (ref 26.0–34.0)
MCHC: 34.7 g/dL (ref 30.0–36.0)
MCV: 92.7 fL (ref 80.0–100.0)
Monocytes Absolute: 0.4 10*3/uL (ref 0.1–1.0)
Monocytes Relative: 7 %
Neutro Abs: 3.6 10*3/uL (ref 1.7–7.7)
Neutrophils Relative %: 62 %
Platelets: 55 10*3/uL — ABNORMAL LOW (ref 150–400)
RBC: 4.26 MIL/uL (ref 4.22–5.81)
RDW: 12.3 % (ref 11.5–15.5)
WBC: 5.8 10*3/uL (ref 4.0–10.5)
nRBC: 0 % (ref 0.0–0.2)

## 2024-02-20 NOTE — Progress Notes (Signed)
 [facility] [address] [name] [address]    Clinic Day:  02/20/24   Referring physician: Omie Bickers, MD  Patient Care Team: Brian Bickers, MD as PCP - General (General Practice)   ASSESSMENT & PLAN:   Assessment: 1.  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  Bone marrow biopsy in 2009 was reportedly normal.  This was done in [place]. -CTAP showed fatty infiltration of the liver with normal spleen. -Work-up for nutritional deficiencies, plasma cell disorder was negative.  Infectious etiology was negative.  LDH was normal. -Platelet count dropped to 29 on 01/26/2020 and was treated with dexamethasone  for 4 days. -Dexamethasone  made to him have epigastric pain, indigestion, burping and hiccups. -Peak platelet count reached 108 on 01/31/2020. -As he cannot tolerate dexamethasone , options include Promacta . - Promacta  25 mg daily started on 04/20/2023.    Plan: 1.  Immune mediated thrombocytopenia: - He was last seen by me on 05/19/2023. - He did not take Promacta  for 4 months as he did not qualify for low income.  He reportedly restarted back on Promacta  25 mg daily about a month ago. - He did not experience any spontaneous bleeding episodes since his last visit. - He reported that he had difficulty timing Promacta  as it has to be taken 1 hour before eating or 2 hours after eating. - Reviewed labs today: Normal LFTs.  Creatinine 1.43.  CBC shows normal white count and hemoglobin.  Platelets are 55K. - Recommend continuing Promacta  25 mg at nighttime at least 2 to 3 hours after eating last meal of the night.  He has 2 more unopened bottles of Promacta . - I will see him back in 2 months with repeat platelet count.  If he still has difficulty taking Promacta  on empty stomach, we may switch him to avatrombopag (Doptelet) 20 mg daily and titrate dosage as needed.  We discussed side effects of avatrombopag.     Orders  Placed This Encounter  Procedures   CBC with Differential    Standing Status:   Future    Expected Date:   04/24/2024    Expiration Date:   02/19/2025   Comprehensive metabolic panel    Standing Status:   Future    Expected Date:   04/24/2024    Expiration Date:   02/19/2025      Brian [name],acting as a scribe for Brian Boros, MD.,have documented all relevant documentation on the behalf of Brian Boros, MD,as directed by  Brian Boros, MD while in the presence of Brian Boros, MD.  I, Brian Boros MD, have reviewed the above documentation for accuracy and completeness, and I agree with the above.    Brian Boros, MD   4/28/20253:14 PM  CHIEF COMPLAINT:   Diagnosis: immune-mediated thrombocytopenia    Cancer Staging  No matching staging information was found for the patient.    Prior Therapy: none  Current Therapy:  observation   HISTORY OF PRESENT ILLNESS:   Oncology History   No history exists.     INTERVAL HISTORY:   Brian [name] is a 83 y.o. male presenting to clinic today for follow up of immune-mediated thrombocytopenia. He was last seen by me on 05/19/23.  Today, he states that he is doing well overall. His appetite level is at 100%. His energy level is at 25%.  PAST MEDICAL HISTORY:   Past Medical History: Past Medical History:  Diagnosis Date   Arthritis  Atypical chest pain    Low-risk exercise stress Cardiolite EF 60% April 2008. Reportedly normal remote cardiac catheterization @ [facility] EF 60%...nuclear....2008   Bradycardia    hospital 03/2010 stopped beta blocker   Complication of anesthesia    Diabetes mellitus without complication (HCC)    Dyslipidemia    Fatigue    Hypertension    Mild aortic regurgitation with LV dilation by prior echocardiogram    Neuropathy    in feet   Obesity    Obstructive sleep apnea    C-Pap compliant   PONV (postoperative nausea and vomiting)    Thrombocytopenia (HCC)     mild follow Dr. 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   Procedure: LEFT TOTAL KNEE ARTHROPLASTY;  Surgeon: Darrin Emerald, MD;  Location: [facility];  Service: Orthopedics;  Laterality: Left;    Social History: Social History   Socioeconomic History   Marital status: Married    Spouse name: Not on file   Number of children: 2   Years of education: Not on file   Highest education level: Some college, no degree  Occupational History   Occupation: retired  Tobacco Use   Smoking status: Never   Smokeless tobacco: Never  Vaping Use   Vaping status: Never Used  Substance and Sexual Activity   Alcohol use: Not Currently   Drug use: No   Sexual activity: Not Currently  Other Topics Concern   Not on file  Social History Narrative   Lives at home with wife   Right handed   Caffeine: Diet coke roughly 12 oz in a day    Social Drivers of Health   Financial Resource Strain: Low Risk  (09/29/2020)   Overall Financial Resource Strain (CARDIA)    Difficulty of Paying Living Expenses: Not hard at all  Food Insecurity: No Food Insecurity (09/29/2020)   Hunger Vital Sign    Worried About Running Out of Food in the Last Year: Never true    Ran Out of Food in the Last Year: Never true  Transportation Needs: No Transportation Needs (09/29/2020)   PRAPARE - Administrator, Civil Service (Medical): No    Lack of Transportation (Non-Medical): No  Physical Activity: Inactive (09/29/2020)   Exercise Vital Sign    Days of Exercise per Week: 0 days    Minutes of Exercise per Session: 0  min  Stress: No Stress Concern Present (09/29/2020)   [employer] of Occupational Health - Occupational Stress Questionnaire    Feeling of Stress : Not at all  Social Connections: Moderately Integrated (09/29/2020)   Social Connection and Isolation Panel [NHANES]    Frequency of Communication with Friends and Family: More than three times a week    Frequency of Social Gatherings with Friends and Family: Three times a week    Attends Religious Services: 1 to 4 times per year    Active Member of Clubs or Organizations: No    Attends Banker Meetings: Never    Marital Status: Married  Catering manager Violence: Not At Risk (09/29/2020)  Humiliation, Afraid, Rape, and Kick questionnaire    Fear of Current or Ex-Partner: No    Emotionally Abused: No    Physically Abused: No    Sexually Abused: No    Family History: Family History  Problem Relation Age of Onset   Heart attack Mother        fatal, cause of death   Heart attack Maternal Grandmother    Memory loss Neg Hx     Current Medications:  Current Outpatient Medications:    amLODipine  (NORVASC ) 10 MG tablet, Take 10 mg by mouth daily. , Disp: , Rfl:    atorvastatin (LIPITOR) 20 MG tablet, Take 20 mg by mouth daily., Disp: , Rfl:    B Complex Vitamins (VITAMIN B-COMPLEX) TABS, Take 1 tablet by mouth daily. , Disp: , Rfl:    eltrombopag  (PROMACTA ) 25 MG tablet, Take 1 tablet (25 mg total) by mouth daily. Take on an empty stomach, 1 hour before a meal or 2 hours after., Disp: 90 tablet, Rfl: 3   fluticasone (FLONASE) 50 MCG/ACT nasal spray, Place 2 sprays into both nostrils at bedtime. , Disp: , Rfl:    gabapentin (NEURONTIN) 600 MG tablet, Take 2,400 mg by mouth daily at 2 am., Disp: , Rfl:    glipiZIDE  (GLUCOTROL  XL) 5 MG 24 hr tablet, Take 5 mg by mouth in the morning and at bedtime., Disp: , Rfl:    GLUCOSAMINE-CHONDROITIN-MSM PO, Take 1,500 mg by mouth daily., Disp: , Rfl:    Glucose Blood (GNP TRUE METRIX  GLUCOSE STRIPS VI), USE THREE TIMES DAILY, Disp: , Rfl:    LORazepam (ATIVAN) 0.5 MG tablet, Take 0.5 mg by mouth daily as needed., Disp: , Rfl:    metFORMIN  (GLUCOPHAGE ) 500 MG tablet, Take 500 mg by mouth 4 (four) times daily. , Disp: , Rfl:    olmesartan (BENICAR) 40 MG tablet, Take 40 mg by mouth daily. , Disp: , Rfl:    TRESIBA FLEXTOUCH 200 UNIT/ML FlexTouch Pen, 26-30 units daily, Disp: , Rfl:    TRUEPLUS 5-BEVEL PEN NEEDLES 31G X 6 MM MISC, SMARTSIG:injection Daily, Disp: , Rfl:    vitamin B-12 (CYANOCOBALAMIN ) 1000 MCG tablet, Take 5,000 mcg by mouth daily., Disp: , Rfl:    Allergies: Allergies  Allergen Reactions   Contrast Media [Iodinated Contrast Media] Other (See Comments)    Allergic to MRI contrast. Eyes swelling very badly    Penicillin G    Penicillins Other (See Comments)    dizziness   Gemtesa  [Vibegron ] Rash    Patient reports red spots on his arms and itching    REVIEW OF SYSTEMS:   Review of Systems  Constitutional:  Negative for chills, fatigue and fever.  HENT:   Negative for lump/mass, mouth sores, nosebleeds, sore throat and trouble swallowing.   Eyes:  Negative for eye problems.  Respiratory:  Negative for cough and shortness of breath.   Cardiovascular:  Negative for chest pain, leg swelling and palpitations.  Gastrointestinal:  Negative for abdominal pain, constipation, diarrhea, nausea and vomiting.  Genitourinary:  Negative for bladder incontinence, difficulty urinating, dysuria, frequency, hematuria and nocturia.   Musculoskeletal:  Negative for arthralgias, back pain, flank pain, myalgias and neck pain.  Skin:  Negative for itching and rash.  Neurological:  Positive for dizziness. Negative for headaches and numbness.  Hematological:  Does not bruise/bleed easily.  Psychiatric/Behavioral:  Positive for sleep disturbance. Negative for depression and suicidal ideas. The patient is not nervous/anxious.   All other systems reviewed and  are negative.     VITALS:   Blood pressure (!) 155/84, pulse 81, temperature 97.9 F (36.6 C), temperature source Oral, resp. rate 16, weight 261 lb 11 oz (118.7 kg), SpO2 95%.  Wt Readings from Last 3 Encounters:  02/20/24 261 lb 11 oz (118.7 kg)  09/01/23 263 lb (119.3 kg)  05/19/23 257 lb 8 oz (116.8 kg)    Body mass index is 36.5 kg/m.  Performance status (ECOG): 1 - Symptomatic but completely ambulatory  PHYSICAL EXAM:   Physical Exam Vitals and nursing note reviewed. Exam conducted with a chaperone present.  Constitutional:      Appearance: Normal appearance.  Cardiovascular:     Rate and Rhythm: Normal rate and regular rhythm.     Pulses: Normal pulses.     Heart sounds: Normal heart sounds.  Pulmonary:     Effort: Pulmonary effort is normal.     Breath sounds: Normal breath sounds.  Abdominal:     Palpations: Abdomen is soft. There is no hepatomegaly, splenomegaly or mass.     Tenderness: There is no abdominal tenderness.  Musculoskeletal:     Right lower leg: No edema.     Left lower leg: No edema.  Lymphadenopathy:     Cervical: No cervical adenopathy.     Right cervical: No superficial, deep or posterior cervical adenopathy.    Left cervical: No superficial, deep or posterior cervical adenopathy.     Upper Body:     Right upper body: No supraclavicular or axillary adenopathy.     Left upper body: No supraclavicular or axillary adenopathy.  Neurological:     General: No focal deficit present.     Mental Status: He is alert and oriented to person, place, and time.  Psychiatric:        Mood and Affect: Mood normal.        Behavior: Behavior normal.     LABS:      Latest Ref Rng & Units 02/20/2024    1:17 PM 05/19/2023   12:35 PM 05/03/2023   10:01 AM  CBC  WBC 4.0 - 10.5 K/uL 5.8  5.2  6.0   Hemoglobin 13.0 - 17.0 g/dL 16.1  09.6  04.5   Hematocrit 39.0 - 52.0 % 39.5  38.2  37.9   Platelets 150 - 400 K/uL 55  106  96       Latest Ref Rng & Units 02/20/2024    1:17  PM 09/01/2023    4:39 PM 05/19/2023   12:35 PM  CMP  Glucose 70 - 99 mg/dL 409   811   BUN 8 - 23 mg/dL 22   23   Creatinine 9.14 - 1.24 mg/dL 7.82   9.56   Sodium 213 - 145 mmol/L 132   131   Potassium 3.5 - 5.1 mmol/L 3.6   4.1   Chloride 98 - 111 mmol/L 100   102   CO2 22 - 32 mmol/L 20   20   Calcium 8.9 - 10.3 mg/dL 9.1   9.0   Total Protein 6.5 - 8.1 g/dL 7.4  6.9  7.4   Total Bilirubin 0.0 - 1.2 mg/dL 0.7   0.8   Alkaline Phos 38 - 126 U/L 72   59   AST 15 - 41 U/L 20   18   ALT 0 - 44 U/L 18   18      No results found for: "CEA1", "CEA" / No results found for: "CEA1", "CEA" No  results found for: "PSA1" No results found for: "CAN199" No results found for: "CAN125"  Lab Results  Component Value Date   TOTALPROTELP 6.9 12/04/2018   ALBUMINELP 3.9 12/04/2018   A1GS 0.2 12/04/2018   A2GS 0.7 12/04/2018   BETS 1.0 12/04/2018   GAMS 1.1 12/04/2018   MSPIKE Not Observed 12/04/2018   SPEI Comment 12/04/2018   No results found for: "TIBC", "FERRITIN", "IRONPCTSAT" Lab Results  Component Value Date   LDH 156 05/03/2023   LDH 137 01/15/2022   LDH 147 07/02/2021     STUDIES:   No results found.

## 2024-02-20 NOTE — Patient Instructions (Addendum)
 [facility] at [facility] Discharge Instructions   You were seen and examined today by Dr. Cheree Cords.  He reviewed the results of your lab work. Your platelet count was 55. The other results are normal/stable.   Continue Promacta  as prescribed.   We will see you back in 2 months.  We will repeat your platelet count at that time.   Return as scheduled.    Thank you for choosing [facility] at [facility] to provide your oncology and hematology care.  To afford each patient quality time with our provider, please arrive at least 15 minutes before your scheduled appointment time.   If you have a lab appointment with the Cancer Center please come in thru the Main Entrance and check in at the main information desk.  You need to re-schedule your appointment should you arrive 10 or more minutes late.  We strive to give you quality time with our providers, and arriving late affects you and other patients whose appointments are after yours.  Also, if you no show three or more times for appointments you may be dismissed from the clinic at the providers discretion.     Again, thank you for choosing [facility].  Our hope is that these requests will decrease the amount of time that you wait before being seen by our physicians.       _____________________________________________________________  Should you have questions after your visit to [facility], please contact our office at [phone] and follow the prompts.  Our office hours are 8:00 a.m. and 4:30 p.m. Monday - Friday.  Please note that voicemails left after 4:00 p.m. may not be returned until the following business day.  We are closed weekends and major holidays.  You do have access to a nurse 24-7, just call the main number to the clinic [phone] and do not press any options, hold on the line and a nurse will answer the phone.    For prescription refill requests,  have your pharmacy contact our office and allow 72 hours.    Due to Covid, you will need to wear a mask upon entering the hospital. If you do not have a mask, a mask will be given to you at the Main Entrance upon arrival. For doctor visits, patients may have 1 support person age 63 or older with them. For treatment visits, patients can not have anyone with them due to social distancing guidelines and our immunocompromised population.

## 2024-02-23 ENCOUNTER — Encounter (HOSPITAL_BASED_OUTPATIENT_CLINIC_OR_DEPARTMENT_OTHER): Payer: Self-pay | Admitting: Internal Medicine

## 2024-02-23 DIAGNOSIS — G4733 Obstructive sleep apnea (adult) (pediatric): Secondary | ICD-10-CM

## 2024-02-27 ENCOUNTER — Ambulatory Visit: Admitting: Hematology

## 2024-03-14 DIAGNOSIS — N1831 Chronic kidney disease, stage 3a: Secondary | ICD-10-CM | POA: Diagnosis not present

## 2024-03-14 DIAGNOSIS — G4733 Obstructive sleep apnea (adult) (pediatric): Secondary | ICD-10-CM | POA: Diagnosis not present

## 2024-03-14 DIAGNOSIS — I10 Essential (primary) hypertension: Secondary | ICD-10-CM | POA: Diagnosis not present

## 2024-03-14 DIAGNOSIS — D696 Thrombocytopenia, unspecified: Secondary | ICD-10-CM | POA: Diagnosis not present

## 2024-03-14 DIAGNOSIS — G9009 Other idiopathic peripheral autonomic neuropathy: Secondary | ICD-10-CM | POA: Diagnosis not present

## 2024-03-14 DIAGNOSIS — Z6835 Body mass index (BMI) 35.0-35.9, adult: Secondary | ICD-10-CM | POA: Diagnosis not present

## 2024-03-14 DIAGNOSIS — Z7182 Exercise counseling: Secondary | ICD-10-CM | POA: Diagnosis not present

## 2024-03-14 DIAGNOSIS — Z713 Dietary counseling and surveillance: Secondary | ICD-10-CM | POA: Diagnosis not present

## 2024-03-14 DIAGNOSIS — I129 Hypertensive chronic kidney disease with stage 1 through stage 4 chronic kidney disease, or unspecified chronic kidney disease: Secondary | ICD-10-CM | POA: Diagnosis not present

## 2024-03-21 ENCOUNTER — Ambulatory Visit: Admitting: Hematology

## 2024-04-05 DIAGNOSIS — H524 Presbyopia: Secondary | ICD-10-CM | POA: Diagnosis not present

## 2024-04-05 DIAGNOSIS — H52203 Unspecified astigmatism, bilateral: Secondary | ICD-10-CM | POA: Diagnosis not present

## 2024-04-05 DIAGNOSIS — E113393 Type 2 diabetes mellitus with moderate nonproliferative diabetic retinopathy without macular edema, bilateral: Secondary | ICD-10-CM | POA: Diagnosis not present

## 2024-04-05 DIAGNOSIS — H26491 Other secondary cataract, right eye: Secondary | ICD-10-CM | POA: Diagnosis not present

## 2024-04-23 ENCOUNTER — Ambulatory Visit: Admitting: Hematology

## 2024-04-23 ENCOUNTER — Other Ambulatory Visit

## 2024-04-24 ENCOUNTER — Telehealth: Payer: Self-pay | Admitting: Pharmacy Technician

## 2024-04-24 ENCOUNTER — Inpatient Hospital Stay: Attending: Hematology

## 2024-04-24 ENCOUNTER — Other Ambulatory Visit (HOSPITAL_COMMUNITY): Payer: Self-pay

## 2024-04-24 ENCOUNTER — Telehealth: Payer: Self-pay | Admitting: Pharmacist

## 2024-04-24 ENCOUNTER — Inpatient Hospital Stay (HOSPITAL_BASED_OUTPATIENT_CLINIC_OR_DEPARTMENT_OTHER): Admitting: Hematology

## 2024-04-24 VITALS — BP 154/86 | HR 70 | Temp 97.5°F | Resp 20 | Wt 267.2 lb

## 2024-04-24 DIAGNOSIS — K760 Fatty (change of) liver, not elsewhere classified: Secondary | ICD-10-CM | POA: Diagnosis not present

## 2024-04-24 DIAGNOSIS — D696 Thrombocytopenia, unspecified: Secondary | ICD-10-CM | POA: Insufficient documentation

## 2024-04-24 DIAGNOSIS — D693 Immune thrombocytopenic purpura: Secondary | ICD-10-CM

## 2024-04-24 LAB — COMPREHENSIVE METABOLIC PANEL WITH GFR
ALT: 17 U/L (ref 0–44)
AST: 19 U/L (ref 15–41)
Albumin: 3.7 g/dL (ref 3.5–5.0)
Alkaline Phosphatase: 59 U/L (ref 38–126)
Anion gap: 11 (ref 5–15)
BUN: 28 mg/dL — ABNORMAL HIGH (ref 8–23)
CO2: 20 mmol/L — ABNORMAL LOW (ref 22–32)
Calcium: 9 mg/dL (ref 8.9–10.3)
Chloride: 103 mmol/L (ref 98–111)
Creatinine, Ser: 1.33 mg/dL — ABNORMAL HIGH (ref 0.61–1.24)
GFR, Estimated: 54 mL/min — ABNORMAL LOW (ref >60)
Glucose, Bld: 215 mg/dL — ABNORMAL HIGH (ref 70–99)
Potassium: 3.6 mmol/L (ref 3.5–5.1)
Sodium: 134 mmol/L — ABNORMAL LOW (ref 135–145)
Total Bilirubin: 0.6 mg/dL (ref 0.0–1.2)
Total Protein: 7.1 g/dL (ref 6.5–8.1)

## 2024-04-24 LAB — CBC WITH DIFFERENTIAL/PLATELET
Abs Immature Granulocytes: 0.02 10*3/uL (ref 0.00–0.07)
Basophils Absolute: 0 10*3/uL (ref 0.0–0.1)
Basophils Relative: 0 %
Eosinophils Absolute: 0.4 10*3/uL (ref 0.0–0.5)
Eosinophils Relative: 9 %
HCT: 39 % (ref 39.0–52.0)
Hemoglobin: 13.4 g/dL (ref 13.0–17.0)
Immature Granulocytes: 0 %
Lymphocytes Relative: 34 %
Lymphs Abs: 1.7 10*3/uL (ref 0.7–4.0)
MCH: 32.5 pg (ref 26.0–34.0)
MCHC: 34.4 g/dL (ref 30.0–36.0)
MCV: 94.7 fL (ref 80.0–100.0)
Monocytes Absolute: 0.4 10*3/uL (ref 0.1–1.0)
Monocytes Relative: 8 %
Neutro Abs: 2.3 10*3/uL (ref 1.7–7.7)
Neutrophils Relative %: 49 %
Platelets: 55 10*3/uL — ABNORMAL LOW (ref 150–400)
RBC: 4.12 MIL/uL — ABNORMAL LOW (ref 4.22–5.81)
RDW: 12.7 % (ref 11.5–15.5)
WBC: 4.8 10*3/uL (ref 4.0–10.5)
nRBC: 0 % (ref 0.0–0.2)

## 2024-04-24 MED ORDER — DOPTELET 20 MG PO TABS: 20.0000 mg | ORAL_TABLET | Freq: Every day | ORAL | 3 refills | Status: DC

## 2024-04-24 NOTE — Telephone Encounter (Signed)
 Oral Oncology Patient Advocate Encounter  Additional questions were faxed to the oncology clinic.  They have been completed and e-faxed back to patient's insurance. (E-faxed to [phone])  Brian [name] ([name]) [name], [name]  [name], [place], [name], [name] Oral Chemotherapy Patient Advocate Phone: [phone]  Fax: [phone]

## 2024-04-24 NOTE — Patient Instructions (Addendum)
 [facility] at [facility] Discharge Instructions   You were seen and examined today by Dr. Wolman.  He reviewed the results of your lab work which are normal/stable.   We will see you back in 5 weeks. We will repeat your lab work at that time.    Return as scheduled.    Thank you for choosing [facility] at [facility] to provide your oncology and hematology care.  To afford each patient quality time with our provider, please arrive at least 15 minutes before your scheduled appointment time.   If you have a lab appointment with the Cancer Center please come in thru the Main Entrance and check in at the main information desk.  You need to re-schedule your appointment should you arrive 10 or more minutes late.  We strive to give you quality time with our providers, and arriving late affects you and other patients whose appointments are after yours.  Also, if you no show three or more times for appointments you may be dismissed from the clinic at the providers discretion.     Again, thank you for choosing [facility].  Our hope is that these requests will decrease the amount of time that you wait before being seen by our physicians.       _____________________________________________________________  Should you have questions after your visit to [facility], please contact our office at [phone] and follow the prompts.  Our office hours are 8:00 a.m. and 4:30 p.m. Monday - Friday.  Please note that voicemails left after 4:00 p.m. may not be returned until the following business day.  We are closed weekends and major holidays.  You do have access to a nurse 24-7, just call the main number to the clinic [phone] and do not press any options, hold on the line and a nurse will answer the phone.    For prescription refill requests, have your pharmacy contact our office and allow 72 hours.    Due to Covid, you will  need to wear a mask upon entering the hospital. If you do not have a mask, a mask will be given to you at the Main Entrance upon arrival. For doctor visits, patients may have 1 support person age 44 or older with them. For treatment visits, patients can not have anyone with them due to social distancing guidelines and our immunocompromised population.

## 2024-04-24 NOTE — Telephone Encounter (Signed)
 Oral Oncology Patient Advocate Encounter   Received notification that prior authorization for Doptelet is required.   PA submitted on 04/24/2024 Key [id number] Status is pending     [name] ([name]) [name], [name]  [facility], [place], [name], [name] Oral Chemotherapy Patient Advocate Phone: [phone]  Fax: [phone]

## 2024-04-24 NOTE — Progress Notes (Signed)
 [facility] [address] [name] [address]    Clinic Day:  04/24/24   Referring physician: Shona Norleen PEDLAR, MD  Patient Care Team: Shona Norleen PEDLAR, MD as PCP - General (General Practice)   ASSESSMENT & PLAN:   Assessment: 1.  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  Bone marrow biopsy in 2009 was reportedly normal.  This was done in [place]. -CTAP showed fatty infiltration of the liver with normal spleen. -Work-up for nutritional deficiencies, plasma cell disorder was negative.  Infectious etiology was negative.  LDH was normal. -Platelet count dropped to 29 on 01/26/2020 and was treated with dexamethasone  for 4 days. -Dexamethasone  made to him have epigastric pain, indigestion, burping and hiccups. -Peak platelet count reached 108 on 01/31/2020. -As he cannot tolerate dexamethasone , options include Promacta . - Promacta  25 mg daily started on 04/20/2023.    Plan: 1.  Immune mediated thrombocytopenia: - He is continuing to take Promacta  25 mg daily.  Denies any bleeding issues. - However he is having difficulty timing Promacta  as it has to be taken 1 hour before or 2 hours after eating. - Reviewed labs today: Creatinine is 1.33.  LFTs are normal.  CBC shows platelet count is stable at 55.  White count and hemoglobin is normal. - I have discussed option including Doptelet which can be taken without relation to food.  We discussed side effects in detail.  He is agreeable.  Will start him on Doptelet 20 mg daily.  He will start taking it once he gets the prescription.  Return to clinic in 5 weeks with repeat labs.     Orders Placed This Encounter  Procedures   CBC with Differential    Standing Status:   Future    Expected Date:   05/29/2024    Expiration Date:   08/27/2024   Comprehensive metabolic panel    Standing Status:   Future    Expected Date:   05/29/2024    Expiration Date:   08/27/2024      LILLETTE Hummingbird R  Teague,acting as a scribe for Alean Stands, MD.,have documented all relevant documentation on the behalf of Alean Stands, MD,as directed by  Alean Stands, MD while in the presence of Alean Stands, MD.  I, Alean Stands MD, have reviewed the above documentation for accuracy and completeness, and I agree with the above.     Alean Stands, MD   7/1/20255:32 PM  CHIEF COMPLAINT:   Diagnosis: immune-mediated thrombocytopenia    Cancer Staging  No matching staging information was found for the patient.    Prior Therapy: none  Current Therapy:  observation   HISTORY OF PRESENT ILLNESS:   Oncology History   No history exists.     INTERVAL HISTORY:   Mccauley is a 83 y.o. male presenting to clinic today for follow up of immune-mediated thrombocytopenia. He was last seen by me on 02/20/24.  Today, he states that he is doing well overall. His appetite level is at 25%. His energy level is at 50%. Shadrick is accompanied by his wife. He is taking Promacta , though he still notes difficulty timing the medication.  He is willing to transfer to Doptelet.   PAST MEDICAL HISTORY:   Past Medical History: Past Medical History:  Diagnosis Date   Arthritis    Atypical chest pain    Low-risk exercise stress Cardiolite EF 60% April 2008. Reportedly normal remote cardiac catheterization @ [facility] EF 60%...nuclear....2008   Bradycardia  hospital 03/2010 stopped beta blocker   Complication of anesthesia    Diabetes mellitus without complication (HCC)    Dyslipidemia    Fatigue    Hypertension    Mild aortic regurgitation with LV dilation by prior echocardiogram    Neuropathy    in feet   Obesity    Obstructive sleep apnea    C-Pap compliant   PONV (postoperative nausea and vomiting)    Thrombocytopenia (HCC)    mild follow Dr. 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   Procedure: LEFT TOTAL KNEE ARTHROPLASTY;  Surgeon: Taft FORBES Minerva, MD;  Location: [facility];  Service: Orthopedics;  Laterality: Left;    Social History: Social History   Socioeconomic History   Marital status: Married    Spouse name: Not on file   Number of children: 2   Years of education: Not on file   Highest education level: Some college, no degree  Occupational History   Occupation: retired  Tobacco Use   Smoking status: Never   Smokeless tobacco: Never  Vaping Use   Vaping status: Never Used  Substance and Sexual Activity   Alcohol use: Not Currently   Drug use: No   Sexual activity: Not Currently  Other Topics Concern   Not on file  Social History Narrative   Lives at home with wife   Right handed   Caffeine: Diet coke roughly 12 oz in a day    Social Drivers of Health   Financial Resource Strain: Low Risk  (09/29/2020)   Overall Financial Resource Strain (CARDIA)    Difficulty of Paying Living Expenses: Not hard at all  Food Insecurity: No Food Insecurity (09/29/2020)   Hunger Vital Sign    Worried About Running Out of Food in the Last Year: Never true    Ran Out of Food in the Last Year: Never true  Transportation Needs: No Transportation Needs (09/29/2020)   PRAPARE - Administrator, Civil Service (Medical): No    Lack of Transportation (Non-Medical): No  Physical Activity: Inactive (09/29/2020)   Exercise Vital Sign    Days of Exercise per Week: 0 days    Minutes of Exercise per Session: 0 min  Stress: No Stress Concern Present (09/29/2020)   [employer] of Occupational Health - Occupational Stress Questionnaire     Feeling of Stress : Not at all  Social Connections: Moderately Integrated (09/29/2020)   Social Connection and Isolation Panel    Frequency of Communication with Friends and Family: More than three times a week    Frequency of Social Gatherings with Friends and Family: Three times a week    Attends Religious Services: 1 to 4 times per year    Active Member of Clubs or Organizations: No    Attends Banker Meetings: Never    Marital Status: Married  Catering manager Violence: Not At Risk (09/29/2020)   Humiliation, Afraid, Rape, and Kick questionnaire    Fear of Current or Ex-Partner: No    Emotionally Abused: No    Physically Abused: No  Sexually Abused: No    Family History: Family History  Problem Relation Age of Onset   Heart attack Mother        fatal, cause of death   Heart attack Maternal Grandmother    Memory loss Neg Hx     Current Medications:  Current Outpatient Medications:    avatrombopag maleate (DOPTELET) 20 MG tablet, Take 1 tablet (20 mg total) by mouth daily. Take with food., Disp: 30 tablet, Rfl: 3   amLODipine  (NORVASC ) 10 MG tablet, Take 10 mg by mouth daily. , Disp: , Rfl:    atorvastatin (LIPITOR) 20 MG tablet, Take 20 mg by mouth daily., Disp: , Rfl:    B Complex Vitamins (VITAMIN B-COMPLEX) TABS, Take 1 tablet by mouth daily. , Disp: , Rfl:    eltrombopag  (PROMACTA ) 25 MG tablet, Take 1 tablet (25 mg total) by mouth daily. Take on an empty stomach, 1 hour before a meal or 2 hours after., Disp: 90 tablet, Rfl: 3   fluticasone (FLONASE) 50 MCG/ACT nasal spray, Place 2 sprays into both nostrils at bedtime. , Disp: , Rfl:    gabapentin (NEURONTIN) 600 MG tablet, Take 2,400 mg by mouth daily at 2 am., Disp: , Rfl:    glipiZIDE  (GLUCOTROL  XL) 5 MG 24 hr tablet, Take 5 mg by mouth in the morning and at bedtime., Disp: , Rfl:    GLUCOSAMINE-CHONDROITIN-MSM PO, Take 1,500 mg by mouth daily., Disp: , Rfl:    Glucose Blood (GNP TRUE METRIX GLUCOSE STRIPS  VI), USE THREE TIMES DAILY, Disp: , Rfl:    LORazepam (ATIVAN) 0.5 MG tablet, Take 0.5 mg by mouth daily as needed., Disp: , Rfl:    metFORMIN  (GLUCOPHAGE ) 500 MG tablet, Take 500 mg by mouth 4 (four) times daily. , Disp: , Rfl:    olmesartan (BENICAR) 40 MG tablet, Take 40 mg by mouth daily. , Disp: , Rfl:    TRESIBA FLEXTOUCH 200 UNIT/ML FlexTouch Pen, 26-30 units daily, Disp: , Rfl:    TRUEPLUS 5-BEVEL PEN NEEDLES 31G X 6 MM MISC, SMARTSIG:injection Daily, Disp: , Rfl:    vitamin B-12 (CYANOCOBALAMIN ) 1000 MCG tablet, Take 5,000 mcg by mouth daily., Disp: , Rfl:    Allergies: Allergies  Allergen Reactions   Contrast Media [Iodinated Contrast Media] Other (See Comments)    Allergic to MRI contrast. Eyes swelling very badly    Penicillin G    Penicillins Other (See Comments)    dizziness   Gemtesa  [Vibegron ] Rash    Patient reports red spots on his arms and itching    REVIEW OF SYSTEMS:   Review of Systems  Constitutional:  Negative for chills, fatigue and fever.  HENT:   Negative for lump/mass, mouth sores, nosebleeds, sore throat and trouble swallowing.   Eyes:  Negative for eye problems.  Respiratory:  Positive for shortness of breath (with exertion). Negative for cough.   Cardiovascular:  Negative for chest pain, leg swelling and palpitations.  Gastrointestinal:  Negative for abdominal pain, constipation, diarrhea, nausea and vomiting.  Genitourinary:  Negative for bladder incontinence, difficulty urinating, dysuria, frequency, hematuria and nocturia.   Musculoskeletal:  Negative for arthralgias, back pain, flank pain, myalgias and neck pain.  Skin:  Negative for itching and rash.  Neurological:  Positive for dizziness and numbness (in feet). Negative for headaches.  Hematological:  Does not bruise/bleed easily.  Psychiatric/Behavioral:  Positive for sleep disturbance. Negative for depression and suicidal ideas. The patient is not nervous/anxious.   All other systems reviewed  and are negative.    VITALS:   Blood pressure (!) 154/86, pulse 70, temperature (!) 97.5 F (36.4 C), temperature source Tympanic, resp. rate 20, weight 267 lb 3.2 oz (121.2 kg), SpO2 94%.  Wt Readings from Last 3 Encounters:  04/24/24 267 lb 3.2 oz (121.2 kg)  02/20/24 261 lb 11 oz (118.7 kg)  09/01/23 263 lb (119.3 kg)    Body mass index is 37.27 kg/m.  Performance status (ECOG): 1 - Symptomatic but completely ambulatory  PHYSICAL EXAM:   Physical Exam Vitals and nursing note reviewed. Exam conducted with a chaperone present.  Constitutional:      Appearance: Normal appearance.   Cardiovascular:     Rate and Rhythm: Normal rate and regular rhythm.     Pulses: Normal pulses.     Heart sounds: Normal heart sounds.  Pulmonary:     Effort: Pulmonary effort is normal.     Breath sounds: Normal breath sounds.  Abdominal:     Palpations: Abdomen is soft. There is no hepatomegaly, splenomegaly or mass.     Tenderness: There is no abdominal tenderness.   Musculoskeletal:     Right lower leg: No edema.     Left lower leg: No edema.  Lymphadenopathy:     Cervical: No cervical adenopathy.     Right cervical: No superficial, deep or posterior cervical adenopathy.    Left cervical: No superficial, deep or posterior cervical adenopathy.     Upper Body:     Right upper body: No supraclavicular or axillary adenopathy.     Left upper body: No supraclavicular or axillary adenopathy.   Neurological:     General: No focal deficit present.     Mental Status: He is alert and oriented to person, place, and time.   Psychiatric:        Mood and Affect: Mood normal.        Behavior: Behavior normal.     LABS:      Latest Ref Rng & Units 04/24/2024   10:14 AM 02/20/2024    1:17 PM 05/19/2023   12:35 PM  CBC  WBC 4.0 - 10.5 K/uL 4.8  5.8  5.2   Hemoglobin 13.0 - 17.0 g/dL 86.5  86.2  86.2   Hematocrit 39.0 - 52.0 % 39.0  39.5  38.2   Platelets 150 - 400 K/uL 55  55  106        Latest Ref Rng & Units 04/24/2024   10:14 AM 02/20/2024    1:17 PM 09/01/2023    4:39 PM  CMP  Glucose 70 - 99 mg/dL 784  710    BUN 8 - 23 mg/dL 28  22    Creatinine 9.38 - 1.24 mg/dL 8.66  8.56    Sodium 864 - 145 mmol/L 134  132    Potassium 3.5 - 5.1 mmol/L 3.6  3.6    Chloride 98 - 111 mmol/L 103  100    CO2 22 - 32 mmol/L 20  20    Calcium 8.9 - 10.3 mg/dL 9.0  9.1    Total Protein 6.5 - 8.1 g/dL 7.1  7.4  6.9   Total Bilirubin 0.0 - 1.2 mg/dL 0.6  0.7    Alkaline Phos 38 - 126 U/L 59  72    AST 15 - 41 U/L 19  20    ALT 0 - 44 U/L 17  18       No results found for: CEA1, CEA / No results  found for: CEA1, CEA No results found for: PSA1 No results found for: CAN199 No results found for: RJW874  Lab Results  Component Value Date   TOTALPROTELP 6.9 12/04/2018   ALBUMINELP 3.9 12/04/2018   A1GS 0.2 12/04/2018   A2GS 0.7 12/04/2018   BETS 1.0 12/04/2018   GAMS 1.1 12/04/2018   MSPIKE Not Observed 12/04/2018   SPEI Comment 12/04/2018   No results found for: TIBC, FERRITIN, IRONPCTSAT Lab Results  Component Value Date   LDH 156 05/03/2023   LDH 137 01/15/2022   LDH 147 07/02/2021     STUDIES:   No results found.

## 2024-04-24 NOTE — Telephone Encounter (Signed)
 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  Prescription dose and frequency assessed.   Current medication list in Epic reviewed, no DDIs with avatrombopag identified.   Evaluated chart and no patient barriers to medication adherence identified.   Prescription has been e-scribed to the [facility] for benefits analysis and approval.  Oral Oncology Clinic will continue to follow for insurance authorization, copayment issues, initial counseling and start date.   Brian [name], PharmD, BCOP, CPP Hematology/Oncology Clinical Pharmacist [facility] Oral Chemotherapy Navigation Clinic [phone]  04/24/2024 12:09 PM;

## 2024-04-25 NOTE — Telephone Encounter (Signed)
 Oral Oncology Patient Advocate Encounter  Received notification that the request for prior authorization for Doptelet has been denied due to The following criteria were not met: Chronic ITP, Initial therapy (A and B): A): (i or ii): i) platelet count less than 30,000 microliters or ii) platelet count less than 50,000 microliters and patient is at an increased risk of bleeding, and B) tried one other therapy (e.g., systemic corticosteroids, intravenous immunoglobulin, anti-D immunoglobulin, eltrombopag  tablets and oral suspension, romiplostim subcutaneous injection, fostamatinib tablets, rituximab) or had a splenectomy.The records your doctor gave us  did not show you met (any of) the requirements.   Please see indexed document for detailed information.     Brian [name] (Brian [name]) [name], [name]  [facility], [place], [name], [name] Oral Chemotherapy Patient Advocate Phone: [phone]  Fax: [phone]

## 2024-04-26 ENCOUNTER — Telehealth: Payer: Self-pay | Admitting: Pharmacy Technician

## 2024-04-26 ENCOUNTER — Other Ambulatory Visit (HOSPITAL_COMMUNITY): Payer: Self-pay

## 2024-04-26 NOTE — Telephone Encounter (Signed)
 Expedited Appeal faxed to [facility] on 04/26/24.

## 2024-04-26 NOTE — Telephone Encounter (Signed)
 Oral Oncology Patient Advocate Encounter   An urgent appeal for the prior authorization denial of Doptelet has been started by the pharmacist on 04/25/2024.   This encounter will continue to be updated until final appeal determination.      Savvy Peeters ([name]) [name], [name]  [facility], [place], [name], [name] Oral Chemotherapy Patient Advocate Phone: [phone]  Fax: [phone]

## 2024-04-26 NOTE — Telephone Encounter (Signed)
 Oral Oncology Patient Advocate Encounter  Prior Authorization Appeal for Doptelet has been approved.    PA# [id number] Effective dates: 04/24/2024 until further notice.  Patients co-pay is $1,697.44.    Brian [name] ([name]) [name], [name]  [facility], [place], [name], [name] Oral Chemotherapy Patient Advocate Phone: [phone]  Fax: [phone]

## 2024-04-26 NOTE — Telephone Encounter (Signed)
 Oral Oncology Patient Advocate Encounter   Began application for assistance for Doptelet through [employer].   Application will be submitted upon completion of necessary supporting documentation.   Doptelet Connect phone number [phone].  Waiting on provider and patient signature as well as income information for the patient.   Baltazar Pekala ([name]) [name], [name]  [facility], [place], [name], [name] Oral Chemotherapy Patient Advocate Phone: [phone]  Fax: [phone]

## 2024-04-30 ENCOUNTER — Other Ambulatory Visit (HOSPITAL_COMMUNITY): Payer: Self-pay

## 2024-04-30 NOTE — Telephone Encounter (Signed)
 Oral Oncology Patient Advocate Encounter  I have received patient's income information and household size.  Still waiting on provider and patient signatures.  Application will be submitted upon completion of necessary supporting documentation.   Doptelet  Connect phone number [phone].  Sumaiyah Markert ([name]) [name], [name]  [facility], [place], [name], [name] Oral Chemotherapy Patient Advocate Phone: [phone]  Fax: [phone]

## 2024-05-02 NOTE — Telephone Encounter (Signed)
 Oral Oncology Patient Advocate Encounter  I have received provider's signature(s).  I am still waiting on patient's signature(s).  Brian [name] ([name]) Brian [name], [name]  [facility], [place], Brian [name], [name] Oral Chemotherapy Patient Advocate Phone: [phone]  Fax: [phone]

## 2024-05-08 NOTE — Telephone Encounter (Signed)
 Oral Oncology Patient Advocate Encounter   Submitted application for assistance for Doptelet  to [employer]  Connect.   Application submitted via e-fax to [phone]   Doptelet  Connect phone number [phone].   I will continue to check the status until final determination.   Solange Emry ([name]) [name], [name]  [facility], [place], [name], [name] Oral Chemotherapy Patient Advocate Phone: [phone]  Fax: [phone]

## 2024-05-10 NOTE — Telephone Encounter (Signed)
 Oral Oncology Patient Advocate Encounter  Patient has called me back and I was able to provide him with Doptelet  Connect's phone number.  He knows to contact them.  He also knows to call me back if he has any questions or concerns.  Doptelet  Connect phone number [phone]   [place] [phone]) [name], [name]  [facility], [place], [name], [name] Oral Chemotherapy Patient Advocate Phone: [phone]  Fax: [phone]

## 2024-05-10 NOTE — Telephone Encounter (Signed)
 Oral Oncology Patient Advocate Encounter  I contacted Doptelet  Connect to get an update on the patient's application, the representative stated that they are waiting to speak with the patient to confirm that he needs financial assistance.  Doptelet  Connect phone number [phone].   I will continue to check the status until final determination.  Debie Ashline ([name]) [name], [name]  [facility], [place], [name], [name] Oral Chemotherapy Patient Advocate Phone: [phone]  Fax: [phone]

## 2024-05-11 NOTE — Telephone Encounter (Signed)
 Oral Oncology Patient Advocate Encounter   Received notification that the application for assistance for Doptelet  through [employer]  [employer] has been approved.   Doptelet  Connect phone number [phone].   Effective dates: 05/10/2024 through 10/24/2024  Medication will be filled at [facility].  Patient has already set up his first shipment and will be receiving it on 05/15/2024.  Leonore Frankson ([name]) [name], [name]  [facility], [place], [name], [name] Oral Chemotherapy Patient Advocate Phone: [phone]  Fax: [phone]

## 2024-05-14 NOTE — Telephone Encounter (Signed)
 Clinical Pharmacist Practitioner Encounter   Medication set to be delivered to patient on 05/14/24 or 05/15/24. He will get started when he has medication in hand. He reports he stopped his Promacta  on 05/12/24.   Patient Education I spoke with patient for overview of new oral chemotherapy medication: Doptelet  (avatrombopag) for the treatment of chronic ITP, planned duration until disease progression or unacceptable drug toxicity.   Counseled patient on administration, dosing, side effects, monitoring, drug-food interactions, safe handling, storage, and disposal. Patient will take 1 tablet (20 mg) by mouth daily. Take with food.   Side effects include but not limited to: fatigue and headache.    Reviewed with patient importance of keeping a medication schedule and plan for any missed doses.  After discussion with patient no patient barriers to medication adherence identified.   Distress thermometer not completed during telephone call as patient has been on previous lines of therapy.   Mr. Greenlaw voiced understanding and appreciation. All questions answered. Medication handout provided.  Provided patient with Oral Chemotherapy Navigation Clinic phone number. Patient knows to call the office with questions or concerns. Oral Chemotherapy Navigation Clinic will continue to follow.  Ayodele Sangalang N. Destine Ambroise, PharmD, BCOP, CPP Hematology/Oncology Clinical Pharmacist [facility] Oral Chemotherapy Navigation Clinic [phone]  05/14/2024 10:17 AM

## 2024-05-24 DIAGNOSIS — N1831 Chronic kidney disease, stage 3a: Secondary | ICD-10-CM | POA: Diagnosis not present

## 2024-05-24 DIAGNOSIS — E782 Mixed hyperlipidemia: Secondary | ICD-10-CM | POA: Diagnosis not present

## 2024-05-24 DIAGNOSIS — R972 Elevated prostate specific antigen [PSA]: Secondary | ICD-10-CM | POA: Diagnosis not present

## 2024-05-24 DIAGNOSIS — E1169 Type 2 diabetes mellitus with other specified complication: Secondary | ICD-10-CM | POA: Diagnosis not present

## 2024-05-29 ENCOUNTER — Other Ambulatory Visit

## 2024-05-29 ENCOUNTER — Inpatient Hospital Stay (HOSPITAL_BASED_OUTPATIENT_CLINIC_OR_DEPARTMENT_OTHER): Admitting: Hematology

## 2024-05-29 ENCOUNTER — Ambulatory Visit: Admitting: Hematology

## 2024-05-29 ENCOUNTER — Inpatient Hospital Stay: Attending: Hematology

## 2024-05-29 VITALS — BP 168/86 | HR 71 | Temp 98.2°F | Resp 18 | Wt 264.6 lb

## 2024-05-29 DIAGNOSIS — R066 Hiccough: Secondary | ICD-10-CM | POA: Insufficient documentation

## 2024-05-29 DIAGNOSIS — K30 Functional dyspepsia: Secondary | ICD-10-CM | POA: Insufficient documentation

## 2024-05-29 DIAGNOSIS — R197 Diarrhea, unspecified: Secondary | ICD-10-CM | POA: Insufficient documentation

## 2024-05-29 DIAGNOSIS — Z7984 Long term (current) use of oral hypoglycemic drugs: Secondary | ICD-10-CM | POA: Insufficient documentation

## 2024-05-29 DIAGNOSIS — K760 Fatty (change of) liver, not elsewhere classified: Secondary | ICD-10-CM | POA: Insufficient documentation

## 2024-05-29 DIAGNOSIS — D696 Thrombocytopenia, unspecified: Secondary | ICD-10-CM | POA: Diagnosis not present

## 2024-05-29 DIAGNOSIS — Z794 Long term (current) use of insulin: Secondary | ICD-10-CM | POA: Diagnosis not present

## 2024-05-29 DIAGNOSIS — Z79899 Other long term (current) drug therapy: Secondary | ICD-10-CM | POA: Diagnosis not present

## 2024-05-29 DIAGNOSIS — D693 Immune thrombocytopenic purpura: Secondary | ICD-10-CM | POA: Diagnosis not present

## 2024-05-29 DIAGNOSIS — I10 Essential (primary) hypertension: Secondary | ICD-10-CM | POA: Insufficient documentation

## 2024-05-29 DIAGNOSIS — G4733 Obstructive sleep apnea (adult) (pediatric): Secondary | ICD-10-CM | POA: Diagnosis not present

## 2024-05-29 DIAGNOSIS — E119 Type 2 diabetes mellitus without complications: Secondary | ICD-10-CM | POA: Insufficient documentation

## 2024-05-29 DIAGNOSIS — E785 Hyperlipidemia, unspecified: Secondary | ICD-10-CM | POA: Insufficient documentation

## 2024-05-29 LAB — COMPREHENSIVE METABOLIC PANEL WITH GFR
ALT: 20 U/L (ref 0–44)
AST: 25 U/L (ref 15–41)
Albumin: 3.7 g/dL (ref 3.5–5.0)
Alkaline Phosphatase: 55 U/L (ref 38–126)
Anion gap: 10 (ref 5–15)
BUN: 22 mg/dL (ref 8–23)
CO2: 19 mmol/L — ABNORMAL LOW (ref 22–32)
Calcium: 8.8 mg/dL — ABNORMAL LOW (ref 8.9–10.3)
Chloride: 107 mmol/L (ref 98–111)
Creatinine, Ser: 1.22 mg/dL (ref 0.61–1.24)
GFR, Estimated: 60 mL/min — ABNORMAL LOW (ref >60)
Glucose, Bld: 165 mg/dL — ABNORMAL HIGH (ref 70–99)
Potassium: 3.6 mmol/L (ref 3.5–5.1)
Sodium: 136 mmol/L (ref 135–145)
Total Bilirubin: 0.7 mg/dL (ref 0.0–1.2)
Total Protein: 7.1 g/dL (ref 6.5–8.1)

## 2024-05-29 LAB — CBC WITH DIFFERENTIAL/PLATELET
Abs Immature Granulocytes: 0.41 K/uL — ABNORMAL HIGH (ref 0.00–0.07)
Basophils Absolute: 0 K/uL (ref 0.0–0.1)
Basophils Relative: 0 %
Eosinophils Absolute: 0.3 K/uL (ref 0.0–0.5)
Eosinophils Relative: 5 %
HCT: 39.1 % (ref 39.0–52.0)
Hemoglobin: 13.3 g/dL (ref 13.0–17.0)
Immature Granulocytes: 6 %
Lymphocytes Relative: 27 %
Lymphs Abs: 1.9 K/uL (ref 0.7–4.0)
MCH: 32.2 pg (ref 26.0–34.0)
MCHC: 34 g/dL (ref 30.0–36.0)
MCV: 94.7 fL (ref 80.0–100.0)
Monocytes Absolute: 0.7 K/uL (ref 0.1–1.0)
Monocytes Relative: 11 %
Neutro Abs: 3.5 K/uL (ref 1.7–7.7)
Neutrophils Relative %: 51 %
Platelets: 310 K/uL (ref 150–400)
RBC: 4.13 MIL/uL — ABNORMAL LOW (ref 4.22–5.81)
RDW: 12.7 % (ref 11.5–15.5)
Smear Review: NORMAL
WBC: 6.9 K/uL (ref 4.0–10.5)
nRBC: 0.3 % — ABNORMAL HIGH (ref 0.0–0.2)

## 2024-05-29 NOTE — Procedures (Signed)
 Orders only

## 2024-05-29 NOTE — Progress Notes (Signed)
 [facility] [address], [name] [address]    Clinic Day:  05/31/24   Referring physician: Shona Norleen PEDLAR, MD  Patient Care Team: Shona Norleen PEDLAR, MD as PCP - General (General Practice)   ASSESSMENT & PLAN:   Assessment: 1.  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  Bone marrow biopsy in 2009 was reportedly normal.  This was done in [place]. -CTAP showed fatty infiltration of the liver with normal spleen. -Work-up for nutritional deficiencies, plasma cell disorder was negative.  Infectious etiology was negative.  LDH was normal. -Platelet count dropped to 29 on 01/26/2020 and was treated with dexamethasone  for 4 days. -Dexamethasone  made to him have epigastric pain, indigestion, burping and hiccups. -Peak platelet count reached 108 on 01/31/2020. -As he cannot tolerate dexamethasone , options include Promacta . - Promacta  25 mg daily started on 04/20/2023, discontinued due to poor tolerance. - Doptelet  20 mg daily started on 05/13/2024, dose changed to every other day on 05/28/2024    Plan: 1.  Immune mediated thrombocytopenia: - He was started on Doptelet  20 mg daily on 05/13/2024.  He is taking it with food.  He had 1 episode of diarrhea and has since been having soft stools daily. - Reviewed labs today: LFTs are normal.  Creatinine is normal at 1.22.  Platelet count improved to 310. - I have recommended decreasing Doptelet  20 mg every other day. - Recommend follow-up in 6 weeks with repeat labs.     No orders of the defined types were placed in this encounter.     LILLETTE Verneta SAUNDERS Teague,acting as a Neurosurgeon for Alean Stands, MD.,have documented all relevant documentation on the behalf of Alean Stands, MD,as directed by  Alean Stands, MD while in the presence of Alean Stands, MD.  I, Alean Stands MD, have reviewed the above documentation for accuracy and completeness, and I agree with the  above.      Alean Stands, MD   8/7/20255:40 PM  CHIEF COMPLAINT:   Diagnosis: immune-mediated thrombocytopenia   Prior Therapy: Promacta   Current Therapy: Doptelet    HISTORY OF PRESENT ILLNESS:   Oncology History   No history exists.     INTERVAL HISTORY:   Shion is a 83 y.o. male presenting to clinic today for follow up of immune-mediated thrombocytopenia. He was last seen by me on 04/24/2024.  Today, he states that he is doing well overall. His appetite level is at 100%. His energy level is at 0%. Tadan is accompanied by his wife. He started Doptelet  20 mg daily on 05/13/2024. When he first started taking the medication directly after eating, he noted diarrhea with mealy stools. Diarrhea improved when he began taking the medication while eating. He denies any other side effects from Doptelet .   PAST MEDICAL HISTORY:   Past Medical History: Past Medical History:  Diagnosis Date   Arthritis    Atypical chest pain    Low-risk exercise stress Cardiolite EF 60% April 2008. Reportedly normal remote cardiac catheterization @ [facility] EF 60%...nuclear....2008   Bradycardia    hospital 03/2010 stopped beta blocker   Complication of anesthesia    Diabetes mellitus without complication (HCC)    Dyslipidemia    Fatigue    Hypertension    Mild aortic regurgitation with LV dilation by prior echocardiogram    Neuropathy    in feet   Obesity    Obstructive sleep apnea    C-Pap compliant   PONV (postoperative nausea and vomiting)  Thrombocytopenia (HCC)    mild follow Dr. 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   Procedure: LEFT TOTAL KNEE ARTHROPLASTY;  Surgeon: Taft FORBES Minerva, MD;  Location: [facility];  Service: Orthopedics;  Laterality: Left;    Social History: Social History   Socioeconomic History   Marital status: Married    Spouse name: Not on file   Number of children: 2   Years of education: Not on file   Highest education level: Some college, no degree  Occupational History   Occupation: retired  Tobacco Use   Smoking status: Never   Smokeless tobacco: Never  Vaping Use   Vaping status: Never Used  Substance and Sexual Activity   Alcohol use: Not Currently   Drug use: No   Sexual activity: Not Currently  Other Topics Concern   Not on file  Social History Narrative   Lives at home with wife   Right handed   Caffeine: Diet coke roughly 12 oz in a day    Social Drivers of Health   Financial Resource Strain: Low Risk  (09/29/2020)   Overall Financial Resource Strain (CARDIA)    Difficulty of Paying Living Expenses: Not hard at all  Food Insecurity: No Food Insecurity (09/29/2020)   Hunger Vital Sign    Worried About Running Out of Food in the Last Year: Never true    Ran Out of Food in the Last Year: Never true  Transportation Needs: No Transportation Needs (09/29/2020)   PRAPARE - Administrator, Civil Service (Medical): No    Lack of Transportation (Non-Medical): No  Physical Activity: Inactive (09/29/2020)   Exercise Vital Sign    Days of Exercise per Week: 0 days    Minutes of Exercise per Session: 0 min  Stress: No Stress Concern Present (09/29/2020)   [employer] of Occupational Health - Occupational Stress Questionnaire    Feeling of Stress : Not at all  Social Connections: Moderately Integrated (09/29/2020)   Social Connection and Isolation Panel    Frequency of Communication with Friends and Family: More than three times a week    Frequency of Social Gatherings with Friends and Family: Three times a  week    Attends Religious Services: 1 to 4 times per year    Active Member of Clubs or Organizations: No    Attends Banker Meetings: Never    Marital Status: Married  Catering manager Violence: Not At Risk (09/29/2020)   Humiliation, Afraid, Rape, and Kick questionnaire    Fear of Current or Ex-Partner: No    Emotionally Abused: No    Physically Abused: No    Sexually Abused: No    Family History: Family History  Problem Relation Age of Onset   Heart attack Mother        fatal, cause of death   Heart attack Maternal Grandmother    Memory loss Neg Hx     Current Medications:  Current Outpatient Medications:    amLODipine  (NORVASC ) 10 MG tablet, Take 10 mg by mouth daily. , Disp: , Rfl:  atorvastatin (LIPITOR) 20 MG tablet, Take 20 mg by mouth daily., Disp: , Rfl:    avatrombopag maleate  (DOPTELET ) 20 MG tablet, Take 20 mg by mouth daily. Take with food., Disp: , Rfl:    B Complex Vitamins (VITAMIN B-COMPLEX) TABS, Take 1 tablet by mouth daily. , Disp: , Rfl:    fluticasone (FLONASE) 50 MCG/ACT nasal spray, Place 2 sprays into both nostrils at bedtime. , Disp: , Rfl:    gabapentin (NEURONTIN) 600 MG tablet, Take 2,400 mg by mouth daily at 2 am., Disp: , Rfl:    glipiZIDE  (GLUCOTROL  XL) 5 MG 24 hr tablet, Take 5 mg by mouth in the morning and at bedtime., Disp: , Rfl:    GLUCOSAMINE-CHONDROITIN-MSM PO, Take 1,500 mg by mouth daily., Disp: , Rfl:    Glucose Blood (GNP TRUE METRIX GLUCOSE STRIPS VI), USE THREE TIMES DAILY, Disp: , Rfl:    LORazepam (ATIVAN) 0.5 MG tablet, Take 0.5 mg by mouth daily as needed., Disp: , Rfl:    metFORMIN  (GLUCOPHAGE ) 500 MG tablet, Take 500 mg by mouth 4 (four) times daily. , Disp: , Rfl:    olmesartan (BENICAR) 40 MG tablet, Take 40 mg by mouth daily. , Disp: , Rfl:    TRESIBA FLEXTOUCH 200 UNIT/ML FlexTouch Pen, 26-30 units daily, Disp: , Rfl:    TRUEPLUS 5-BEVEL PEN NEEDLES 31G X 6 MM MISC, SMARTSIG:injection Daily, Disp: , Rfl:     vitamin B-12 (CYANOCOBALAMIN ) 1000 MCG tablet, Take 5,000 mcg by mouth daily., Disp: , Rfl:    Allergies: Allergies  Allergen Reactions   Contrast Media [Iodinated Contrast Media] Other (See Comments)    Allergic to MRI contrast. Eyes swelling very badly    Penicillin G    Penicillins Other (See Comments)    dizziness   Gemtesa  [Vibegron ] Rash    Patient reports red spots on his arms and itching    REVIEW OF SYSTEMS:   Review of Systems  Constitutional:  Positive for fatigue. Negative for chills and fever.  HENT:   Negative for lump/mass, mouth sores, nosebleeds, sore throat and trouble swallowing.   Eyes:  Negative for eye problems.  Respiratory:  Positive for shortness of breath (with exertion). Negative for cough.   Cardiovascular:  Negative for chest pain, leg swelling and palpitations.  Gastrointestinal:  Positive for diarrhea. Negative for abdominal pain, constipation, nausea and vomiting.  Genitourinary:  Negative for bladder incontinence, difficulty urinating, dysuria, frequency, hematuria and nocturia.   Musculoskeletal:  Negative for arthralgias, back pain, flank pain, myalgias and neck pain.  Skin:  Negative for itching and rash.  Neurological:  Negative for dizziness, headaches and numbness.  Hematological:  Does not bruise/bleed easily.  Psychiatric/Behavioral:  Negative for depression, sleep disturbance and suicidal ideas. The patient is not nervous/anxious.   All other systems reviewed and are negative.    VITALS:   Blood pressure (!) 168/86, pulse 71, temperature 98.2 F (36.8 C), temperature source Oral, resp. rate 18, weight 264 lb 8.8 oz (120 kg), SpO2 94%.  Wt Readings from Last 3 Encounters:  05/29/24 264 lb 8.8 oz (120 kg)  04/24/24 267 lb 3.2 oz (121.2 kg)  02/20/24 261 lb 11 oz (118.7 kg)    Body mass index is 36.9 kg/m.  Performance status (ECOG): 1 - Symptomatic but completely ambulatory  PHYSICAL EXAM:   Physical Exam Vitals and nursing note  reviewed. Exam conducted with a chaperone present.  Constitutional:      Appearance: Normal appearance.  Cardiovascular:  Rate and Rhythm: Normal rate and regular rhythm.     Pulses: Normal pulses.     Heart sounds: Normal heart sounds.  Pulmonary:     Effort: Pulmonary effort is normal.     Breath sounds: Normal breath sounds.  Abdominal:     Palpations: Abdomen is soft. There is no hepatomegaly, splenomegaly or mass.     Tenderness: There is no abdominal tenderness.  Musculoskeletal:     Right lower leg: No edema.     Left lower leg: No edema.  Lymphadenopathy:     Cervical: No cervical adenopathy.     Right cervical: No superficial, deep or posterior cervical adenopathy.    Left cervical: No superficial, deep or posterior cervical adenopathy.     Upper Body:     Right upper body: No supraclavicular or axillary adenopathy.     Left upper body: No supraclavicular or axillary adenopathy.  Neurological:     General: No focal deficit present.     Mental Status: He is alert and oriented to person, place, and time.  Psychiatric:        Mood and Affect: Mood normal.        Behavior: Behavior normal.     LABS:      Latest Ref Rng & Units 05/29/2024   11:32 AM 04/24/2024   10:14 AM 02/20/2024    1:17 PM  CBC  WBC 4.0 - 10.5 K/uL 6.9  4.8  5.8   Hemoglobin 13.0 - 17.0 g/dL 86.6  86.5  86.2   Hematocrit 39.0 - 52.0 % 39.1  39.0  39.5   Platelets 150 - 400 K/uL 310  55  55       Latest Ref Rng & Units 05/29/2024   11:32 AM 04/24/2024   10:14 AM 02/20/2024    1:17 PM  CMP  Glucose 70 - 99 mg/dL 834  784  710   BUN 8 - 23 mg/dL 22  28  22    Creatinine 0.61 - 1.24 mg/dL 8.77  8.66  8.56   Sodium 135 - 145 mmol/L 136  134  132   Potassium 3.5 - 5.1 mmol/L 3.6  3.6  3.6   Chloride 98 - 111 mmol/L 107  103  100   CO2 22 - 32 mmol/L 19  20  20    Calcium 8.9 - 10.3 mg/dL 8.8  9.0  9.1   Total Protein 6.5 - 8.1 g/dL 7.1  7.1  7.4   Total Bilirubin 0.0 - 1.2 mg/dL 0.7  0.6  0.7    Alkaline Phos 38 - 126 U/L 55  59  72   AST 15 - 41 U/L 25  19  20    ALT 0 - 44 U/L 20  17  18       No results found for: CEA1, CEA / No results found for: CEA1, CEA No results found for: PSA1 No results found for: CAN199 No results found for: RJW874  Lab Results  Component Value Date   TOTALPROTELP 6.9 12/04/2018   ALBUMINELP 3.9 12/04/2018   A1GS 0.2 12/04/2018   A2GS 0.7 12/04/2018   BETS 1.0 12/04/2018   GAMS 1.1 12/04/2018   MSPIKE Not Observed 12/04/2018   SPEI Comment 12/04/2018   No results found for: TIBC, FERRITIN, IRONPCTSAT Lab Results  Component Value Date   LDH 156 05/03/2023   LDH 137 01/15/2022   LDH 147 07/02/2021     STUDIES:   No results found.

## 2024-05-29 NOTE — Patient Instructions (Addendum)
 [facility] - [facility]  Discharge Instructions  You were seen and examined today by Dr. Seiden.  Dr. Schaffer discussed your most recent lab work which revealed that your platelets are 310 today. All the rest of your labs are good.   Dr. Brahm wants you to start taking the Doptelet  (Avatrombopag) every other day.  Follow-up as scheduled.    Thank you for choosing [name] to provide your oncology and hematology care.   To afford each patient quality time with our provider, please arrive at least 15 minutes before your scheduled appointment time. You may need to reschedule your appointment if you arrive late (10 or more minutes). Arriving late affects you and other patients whose appointments are after yours.  Also, if you miss three or more appointments without notifying the office, you may be dismissed from the clinic at the provider's discretion.    Again, thank you for choosing [facility].  Our hope is that these requests will decrease the amount of time that you wait before being seen by our physicians.   If you have a lab appointment with the Cancer Center - please note that after April 8th, all labs will be drawn in the cancer center.  You do not have to check in or register with the main entrance as you have in the past but will complete your check-in at the cancer center.            _____________________________________________________________  Should you have questions after your visit to [facility], please contact our office at [phone] and follow the prompts.  Our office hours are 8:00 a.m. to 4:30 p.m. Monday - Thursday and 8:00 a.m. to 2:30 p.m. Friday.  Please note that voicemails left after 4:00 p.m. may not be returned until the following business day.  We are closed weekends and all major holidays.  You do have access to a nurse 24-7, just call the main number to the clinic [phone]  and do not press any options, hold on the line and a nurse will answer the phone.    For prescription refill requests, have your pharmacy contact our office and allow 72 hours.    Masks are no longer required in the cancer centers. If you would like for your care team to wear a mask while they are taking care of you, please let them know. You may have one support person who is at least 81 years old accompany you for your appointments.

## 2024-06-05 DIAGNOSIS — I10 Essential (primary) hypertension: Secondary | ICD-10-CM | POA: Diagnosis not present

## 2024-06-05 DIAGNOSIS — R270 Ataxia, unspecified: Secondary | ICD-10-CM | POA: Diagnosis not present

## 2024-06-05 DIAGNOSIS — G4733 Obstructive sleep apnea (adult) (pediatric): Secondary | ICD-10-CM | POA: Diagnosis not present

## 2024-06-05 DIAGNOSIS — G9009 Other idiopathic peripheral autonomic neuropathy: Secondary | ICD-10-CM | POA: Diagnosis not present

## 2024-06-05 DIAGNOSIS — D696 Thrombocytopenia, unspecified: Secondary | ICD-10-CM | POA: Diagnosis not present

## 2024-06-05 DIAGNOSIS — E1169 Type 2 diabetes mellitus with other specified complication: Secondary | ICD-10-CM | POA: Diagnosis not present

## 2024-06-05 DIAGNOSIS — I129 Hypertensive chronic kidney disease with stage 1 through stage 4 chronic kidney disease, or unspecified chronic kidney disease: Secondary | ICD-10-CM | POA: Diagnosis not present

## 2024-06-05 DIAGNOSIS — G20C Parkinsonism, unspecified: Secondary | ICD-10-CM | POA: Diagnosis not present

## 2024-06-05 DIAGNOSIS — N1831 Chronic kidney disease, stage 3a: Secondary | ICD-10-CM | POA: Diagnosis not present

## 2024-06-05 DIAGNOSIS — D631 Anemia in chronic kidney disease: Secondary | ICD-10-CM | POA: Diagnosis not present

## 2024-06-05 DIAGNOSIS — E782 Mixed hyperlipidemia: Secondary | ICD-10-CM | POA: Diagnosis not present

## 2024-07-09 ENCOUNTER — Other Ambulatory Visit: Payer: Self-pay

## 2024-07-09 DIAGNOSIS — D696 Thrombocytopenia, unspecified: Secondary | ICD-10-CM

## 2024-07-09 DIAGNOSIS — D693 Immune thrombocytopenic purpura: Secondary | ICD-10-CM

## 2024-07-10 ENCOUNTER — Inpatient Hospital Stay: Attending: Hematology

## 2024-07-10 ENCOUNTER — Inpatient Hospital Stay

## 2024-07-10 ENCOUNTER — Inpatient Hospital Stay (HOSPITAL_BASED_OUTPATIENT_CLINIC_OR_DEPARTMENT_OTHER): Admitting: Oncology

## 2024-07-10 ENCOUNTER — Ambulatory Visit: Admitting: Oncology

## 2024-07-10 VITALS — BP 156/73 | HR 57 | Temp 97.7°F | Resp 16 | Wt 264.1 lb

## 2024-07-10 DIAGNOSIS — K760 Fatty (change of) liver, not elsewhere classified: Secondary | ICD-10-CM | POA: Insufficient documentation

## 2024-07-10 DIAGNOSIS — D693 Immune thrombocytopenic purpura: Secondary | ICD-10-CM | POA: Diagnosis not present

## 2024-07-10 DIAGNOSIS — Z7952 Long term (current) use of systemic steroids: Secondary | ICD-10-CM | POA: Insufficient documentation

## 2024-07-10 DIAGNOSIS — D696 Thrombocytopenia, unspecified: Secondary | ICD-10-CM

## 2024-07-10 LAB — COMPREHENSIVE METABOLIC PANEL WITH GFR
ALT: 15 U/L (ref 0–44)
AST: 22 U/L (ref 15–41)
Albumin: 3.6 g/dL (ref 3.5–5.0)
Alkaline Phosphatase: 67 U/L (ref 38–126)
Anion gap: 12 (ref 5–15)
BUN: 29 mg/dL — ABNORMAL HIGH (ref 8–23)
CO2: 17 mmol/L — ABNORMAL LOW (ref 22–32)
Calcium: 9.1 mg/dL (ref 8.9–10.3)
Chloride: 106 mmol/L (ref 98–111)
Creatinine, Ser: 1.57 mg/dL — ABNORMAL HIGH (ref 0.61–1.24)
GFR, Estimated: 44 mL/min — ABNORMAL LOW (ref >60)
Glucose, Bld: 163 mg/dL — ABNORMAL HIGH (ref 70–99)
Potassium: 4.2 mmol/L (ref 3.5–5.1)
Sodium: 135 mmol/L (ref 135–145)
Total Bilirubin: 0.8 mg/dL (ref 0.0–1.2)
Total Protein: 6.8 g/dL (ref 6.5–8.1)

## 2024-07-10 LAB — CBC WITH DIFFERENTIAL/PLATELET
Abs Immature Granulocytes: 0.02 K/uL (ref 0.00–0.07)
Basophils Absolute: 0 K/uL (ref 0.0–0.1)
Basophils Relative: 1 %
Eosinophils Absolute: 0.2 K/uL (ref 0.0–0.5)
Eosinophils Relative: 5 %
HCT: 34.7 % — ABNORMAL LOW (ref 39.0–52.0)
Hemoglobin: 11.8 g/dL — ABNORMAL LOW (ref 13.0–17.0)
Immature Granulocytes: 0 %
Lymphocytes Relative: 32 %
Lymphs Abs: 1.6 K/uL (ref 0.7–4.0)
MCH: 33 pg (ref 26.0–34.0)
MCHC: 34 g/dL (ref 30.0–36.0)
MCV: 96.9 fL (ref 80.0–100.0)
Monocytes Absolute: 0.3 K/uL (ref 0.1–1.0)
Monocytes Relative: 7 %
Neutro Abs: 2.7 K/uL (ref 1.7–7.7)
Neutrophils Relative %: 55 %
Platelets: 92 K/uL — ABNORMAL LOW (ref 150–400)
RBC: 3.58 MIL/uL — ABNORMAL LOW (ref 4.22–5.81)
RDW: 14.2 % (ref 11.5–15.5)
Smear Review: NORMAL
WBC: 4.9 K/uL (ref 4.0–10.5)
nRBC: 0 % (ref 0.0–0.2)

## 2024-07-10 NOTE — Assessment & Plan Note (Addendum)
-   He was started on Doptelet  20 mg daily on 05/13/2024.   -He was reduced to every other day by Dr. Wilmarth on 05/29/2024. -He is taking it with food.  He continues to have episodes of loose stools but overall this is stable.  - Reviewed labs today: LFTs are normal.  Creatinine is normal at 1.57. - I have recommended increasing Doptelet  to 20 mg daily x 2 with 1 day off in between. - Recommend follow-up in 6 weeks with repeat labs.

## 2024-07-10 NOTE — Progress Notes (Signed)
 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   -He was reduced to every other day by Dr. Pozzi on 05/29/2024. -He is taking it with food.  He continues to have episodes of loose stools but overall this is stable.  - Reviewed labs today: LFTs are normal.  Creatinine is normal at 1.57. - I have recommended increasing Doptelet  to 20 mg daily x 2 with 1 day off in between. - Recommend follow-up in 6 weeks with repeat labs.  Orders Placed This Encounter  Procedures   CBC with Differential    Standing Status:   Future    Expected Date:   08/21/2024    Expiration Date:   11/19/2024   Comprehensive metabolic panel    Standing Status:   Future    Expected Date:   08/21/2024    Expiration Date:   11/19/2024    INTERVAL HISTORY: Patient returns for follow-up.  Patient reports he has been doing well taking his medication every other day.  Denies any bleeding but does report easy bruising.  Appetite is 100% energy levels are 40%.  Denies any pain.  He has a loose stools when he takes his medication but otherwise they are okay.  Denies diarrhea.  Has trouble falling asleep reports some numbness and burning in his upper extremities.  No recent hospitalizations, surgeries or changes.  We reviewed CBC and CMP.  SUMMARY OF HEMATOLOGIC HISTORY: Oncology History   No history exists.   1.  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  Bone marrow biopsy in 2009 was reportedly normal.  This was done in [place]. -CTAP showed fatty infiltration of the liver with normal spleen. -Work-up for nutritional deficiencies, plasma cell disorder was negative.  Infectious etiology was negative.  LDH was normal. -Platelet count dropped to 29 on 01/26/2020 and was treated with dexamethasone  for 4 days. -Dexamethasone   made to him have epigastric pain, indigestion, burping and hiccups. -Peak platelet count reached 108 on 01/31/2020. -As he cannot tolerate dexamethasone , options include Promacta . - Promacta  25 mg daily started on 04/20/2023, discontinued due to poor tolerance. - Doptelet  20 mg daily started on 05/13/2024, dose changed to every other day on 05/28/2024  CBC    Component Value Date/Time   WBC 4.9 07/10/2024 1018   RBC 3.58 (L) 07/10/2024 1018   HGB 11.8 (L) 07/10/2024 1018   HCT 34.7 (L) 07/10/2024 1018   PLT 92 (L) 07/10/2024 1018   MCV 96.9 07/10/2024 1018   MCH 33.0 07/10/2024 1018   MCHC 34.0 07/10/2024 1018   RDW 14.2 07/10/2024 1018   LYMPHSABS 1.6 07/10/2024 1018   MONOABS 0.3 07/10/2024 1018   EOSABS 0.2 07/10/2024 1018   BASOSABS 0.0 07/10/2024 1018       Latest Ref Rng & Units 07/10/2024   10:18 AM 05/29/2024   11:32 AM 04/24/2024   10:14 AM  CMP  Glucose 70 - 99 mg/dL 836  834  784   BUN 8 - 23 mg/dL 29  22  28    Creatinine 0.61 - 1.24 mg/dL 8.42  8.77  8.66   Sodium 135 - 145 mmol/L 135  136  134   Potassium 3.5 - 5.1 mmol/L 4.2  3.6  3.6   Chloride 98 - 111 mmol/L 106  107  103  CO2 22 - 32 mmol/L 17  19  20    Calcium 8.9 - 10.3 mg/dL 9.1  8.8  9.0   Total Protein 6.5 - 8.1 g/dL 6.8  7.1  7.1   Total Bilirubin 0.0 - 1.2 mg/dL 0.8  0.7  0.6   Alkaline Phos 38 - 126 U/L 67  55  59   AST 15 - 41 U/L 22  25  19    ALT 0 - 44 U/L 15  20  17       Lab Results  Component Value Date   VITAMINB12 769 09/01/2023    Vitals:   07/10/24 1123  BP: (!) 156/73  Pulse: (!) 57  Resp: 16  Temp: 97.7 F (36.5 C)  SpO2: 94%    Review of System:  Review of Systems  Constitutional:  Positive for malaise/fatigue.  Gastrointestinal:  Positive for diarrhea. Negative for nausea.  Neurological:  Positive for tingling and sensory change.  Psychiatric/Behavioral:  The patient has insomnia. The patient is not nervous/anxious.     Physical Exam: Physical Exam Constitutional:       Appearance: Normal appearance.  HENT:     Head: Normocephalic and atraumatic.  Eyes:     Pupils: Pupils are equal, round, and reactive to light.  Cardiovascular:     Rate and Rhythm: Normal rate and regular rhythm.     Heart sounds: Normal heart sounds. No murmur heard. Pulmonary:     Effort: Pulmonary effort is normal.     Breath sounds: Normal breath sounds. No wheezing.  Abdominal:     General: Bowel sounds are normal. There is no distension.     Palpations: Abdomen is soft.     Tenderness: There is no abdominal tenderness.  Musculoskeletal:        General: Normal range of motion.     Cervical back: Normal range of motion.  Skin:    General: Skin is warm and dry.     Findings: No rash.  Neurological:     Mental Status: He is alert and oriented to person, place, and time.     Gait: Gait is intact.  Psychiatric:        Mood and Affect: Mood and affect normal.        Cognition and Memory: Memory normal.        Judgment: Judgment normal.      I spent 20 minutes dedicated to the care of this patient (face-to-face and non-face-to-face) on the date of the encounter to include what is described in the assessment and plan.,  Delon Hope, NP 07/10/2024 12:36 PM

## 2024-07-27 DIAGNOSIS — Z23 Encounter for immunization: Secondary | ICD-10-CM | POA: Diagnosis not present

## 2024-07-31 DIAGNOSIS — Z23 Encounter for immunization: Secondary | ICD-10-CM | POA: Diagnosis not present

## 2024-08-20 NOTE — Progress Notes (Signed)
 08/21/2024 1:14 PM   Brian [name] Jun 04, 1941 [id number]  Referring provider: Shona Norleen PEDLAR, MD [address] [place],  [name] [address]  CC: Abnormal PSA test   HPI: 83 year old male referred by Dr. Zack Hall for evaluation and management of elevated PSA.  Most recent PSA on July 31 of this year was 18.5.  I have no other prior PSA data on him.    PMH: Past Medical History:  Diagnosis Date   Arthritis    Atypical chest pain    Low-risk exercise stress Cardiolite EF 60% April 2008. Reportedly normal remote cardiac catheterization @ [facility] EF 60%...nuclear....2008   Bradycardia    hospital 03/2010 stopped beta blocker   Complication of anesthesia    Diabetes mellitus without complication (HCC)    Dyslipidemia    Fatigue    Hypertension    Mild aortic regurgitation with LV dilation by prior echocardiogram    Neuropathy    in feet   Obesity    Obstructive sleep apnea    C-Pap compliant   PONV (postoperative nausea and vomiting)    Thrombocytopenia    mild follow Dr. Joe   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 Eyes swelling very badly    Penicillin G    Penicillins Other (See Comments)   dizziness   Gemtesa  [vibegron ] Rash   Patient reports red spots on his arms and itching         Medication List        Accurate as of August 20, 2024  1:14 PM. If you have any questions, ask your nurse or doctor.          amLODipine  10 MG tablet Commonly known as: NORVASC  Take 10 mg by mouth daily.   atorvastatin 20 MG tablet Commonly known as: LIPITOR Take 20 mg by mouth daily.   avatrombopag maleate  20 MG tablet Commonly known as: DOPTELET  Take 20 mg by mouth daily. Take with food. What changed: when to take this   cyanocobalamin  1000 MCG tablet Commonly known as: VITAMIN B12 Take 5,000 mcg by mouth daily.   fluticasone 50 MCG/ACT nasal spray Commonly known as: FLONASE Place 2 sprays into both nostrils at bedtime.   gabapentin 600 MG tablet Commonly known as: NEURONTIN Take 2,400 mg by mouth daily at 2 am. What changed: how much to take   glipiZIDE  5 MG 24 hr tablet Commonly known as: GLUCOTROL  XL Take 5 mg by mouth in the morning and at bedtime.   GLUCOSAMINE-CHONDROITIN-MSM PO Take 1,500 mg by mouth daily.   GNP TRUE METRIX GLUCOSE STRIPS VI USE THREE TIMES DAILY   LORazepam 0.5 MG tablet Commonly known as: ATIVAN Take 0.5 mg by mouth daily as needed.   metFORMIN  500 MG tablet Commonly  known as: GLUCOPHAGE  Take 500 mg by mouth 4 (four) times daily.   olmesartan 40 MG tablet Commonly known as: BENICAR Take 40 mg by mouth daily.   Tresiba FlexTouch 200 UNIT/ML FlexTouch Pen Generic drug: insulin degludec 26-30 units daily   TRUEplus 5-Bevel Pen Needles 31G X 6 MM Misc Generic drug: Insulin Pen Needle SMARTSIG:injection Daily   Vitamin B-Complex Tabs Take 1 tablet by mouth daily.        Allergies:  Allergies  Allergen Reactions   Contrast Media [Iodinated Contrast Media] Other (See Comments)    Allergic to MRI contrast. Eyes swelling very badly    Penicillin G    Penicillins Other (See Comments)    dizziness   Gemtesa  [Vibegron ] Rash    Patient reports red spots on his arms and itching    Family History: Family  History  Problem Relation Age of Onset   Heart attack Mother        fatal, cause of death   Heart attack Maternal Grandmother    Memory loss Neg Hx     Social History:  reports that he has never smoked. He has never used smokeless tobacco. He reports that he does not currently use alcohol. He reports that he does not use drugs.  ROS: All other review of systems were reviewed and are negative except what is noted above in HPI  Physical Exam: There were no vitals taken for this visit.  Constitutional:  Alert and oriented, No acute distress. HEENT: [place] AT, moist mucus membranes.  Trachea midline, no masses. Cardiovascular: No clubbing, cyanosis, or edema. Respiratory: Normal respiratory effort, no increased work of breathing. GI: No inguinal hernias GU: Normal phallus. No masses/lesions on penis, testis, scrotum. Prostate ***g smooth no nodules no induration.  Lymph: No cervical or inguinal lymphadenopathy. Skin: No rashes, bruises or suspicious lesions. Neurologic: Grossly intact, no focal deficits, moving all 4 extremities. Psychiatric: Normal mood and affect.  Laboratory Data: Lab Results  Component Value Date   WBC 4.9 07/10/2024   HGB 11.8 (L) 07/10/2024   HCT 34.7 (L) 07/10/2024   MCV 96.9 07/10/2024   PLT 92 (L) 07/10/2024    Lab Results  Component Value Date   CREATININE 1.57 (H) 07/10/2024    CMP, CBC results from July 2025 reviewed  PCP notes reviewed  Urinalysis   Pertinent Imaging: ***  Assessment:    Plan:    There are no diagnoses linked to this encounter.  No follow-ups on file.  Garnette CHRISTELLA Shack, MD  [facility] [place]

## 2024-08-21 ENCOUNTER — Inpatient Hospital Stay: Attending: Hematology

## 2024-08-21 ENCOUNTER — Ambulatory Visit: Admitting: Urology

## 2024-08-21 VITALS — BP 170/75 | HR 70

## 2024-08-21 DIAGNOSIS — R32 Unspecified urinary incontinence: Secondary | ICD-10-CM | POA: Diagnosis not present

## 2024-08-21 DIAGNOSIS — R972 Elevated prostate specific antigen [PSA]: Secondary | ICD-10-CM | POA: Diagnosis not present

## 2024-08-21 DIAGNOSIS — D693 Immune thrombocytopenic purpura: Secondary | ICD-10-CM | POA: Insufficient documentation

## 2024-08-21 DIAGNOSIS — R3915 Urgency of urination: Secondary | ICD-10-CM | POA: Diagnosis not present

## 2024-08-21 DIAGNOSIS — D649 Anemia, unspecified: Secondary | ICD-10-CM | POA: Diagnosis not present

## 2024-08-21 DIAGNOSIS — R350 Frequency of micturition: Secondary | ICD-10-CM | POA: Diagnosis not present

## 2024-08-21 LAB — CBC WITH DIFFERENTIAL/PLATELET
Abs Immature Granulocytes: 0.02 K/uL (ref 0.00–0.07)
Basophils Absolute: 0 K/uL (ref 0.0–0.1)
Basophils Relative: 1 %
Eosinophils Absolute: 0.3 K/uL (ref 0.0–0.5)
Eosinophils Relative: 6 %
HCT: 34.1 % — ABNORMAL LOW (ref 39.0–52.0)
Hemoglobin: 11.5 g/dL — ABNORMAL LOW (ref 13.0–17.0)
Immature Granulocytes: 0 %
Lymphocytes Relative: 33 %
Lymphs Abs: 1.9 K/uL (ref 0.7–4.0)
MCH: 32.7 pg (ref 26.0–34.0)
MCHC: 33.7 g/dL (ref 30.0–36.0)
MCV: 96.9 fL (ref 80.0–100.0)
Monocytes Absolute: 0.5 K/uL (ref 0.1–1.0)
Monocytes Relative: 8 %
Neutro Abs: 3 K/uL (ref 1.7–7.7)
Neutrophils Relative %: 52 %
Platelets: 163 K/uL (ref 150–400)
RBC: 3.52 MIL/uL — ABNORMAL LOW (ref 4.22–5.81)
RDW: 14.5 % (ref 11.5–15.5)
WBC: 5.7 K/uL (ref 4.0–10.5)
nRBC: 0 % (ref 0.0–0.2)

## 2024-08-21 LAB — BLADDER SCAN AMB NON-IMAGING: Scan Result: 12

## 2024-08-21 LAB — COMPREHENSIVE METABOLIC PANEL WITH GFR
ALT: 9 U/L (ref 0–44)
AST: 29 U/L (ref 15–41)
Albumin: 4.1 g/dL (ref 3.5–5.0)
Alkaline Phosphatase: 71 U/L (ref 38–126)
Anion gap: 19 — ABNORMAL HIGH (ref 5–15)
BUN: 24 mg/dL — ABNORMAL HIGH (ref 8–23)
CO2: 19 mmol/L — ABNORMAL LOW (ref 22–32)
Calcium: 9.1 mg/dL (ref 8.9–10.3)
Chloride: 103 mmol/L (ref 98–111)
Creatinine, Ser: 1.29 mg/dL — ABNORMAL HIGH (ref 0.61–1.24)
GFR, Estimated: 56 mL/min — ABNORMAL LOW (ref >60)
Glucose, Bld: 152 mg/dL — ABNORMAL HIGH (ref 70–99)
Potassium: 4.1 mmol/L (ref 3.5–5.1)
Sodium: 140 mmol/L (ref 135–145)
Total Bilirubin: 0.4 mg/dL (ref 0.0–1.2)
Total Protein: 7.1 g/dL (ref 6.5–8.1)

## 2024-08-21 LAB — URINALYSIS, ROUTINE W REFLEX MICROSCOPIC
Bilirubin, UA: NEGATIVE
Glucose, UA: NEGATIVE
Ketones, UA: NEGATIVE
Leukocytes,UA: NEGATIVE
Nitrite, UA: NEGATIVE
RBC, UA: NEGATIVE
Specific Gravity, UA: 1.025 (ref 1.005–1.030)
Urobilinogen, Ur: 0.2 mg/dL (ref 0.2–1.0)
pH, UA: 5.5 (ref 5.0–7.5)

## 2024-08-21 LAB — MICROSCOPIC EXAMINATION: Bacteria, UA: NONE SEEN

## 2024-08-21 NOTE — Progress Notes (Signed)
 Bladder Scan completed today due to reason MD request   Patient can void prior to the bladder scan. Bladder scan result: 12  Performed By: Exie DASEN. CMA  Additional notes- Patient is scheduled to follow up with w/ MD

## 2024-08-22 ENCOUNTER — Other Ambulatory Visit

## 2024-08-22 ENCOUNTER — Ambulatory Visit: Payer: Self-pay

## 2024-08-22 ENCOUNTER — Other Ambulatory Visit: Payer: Self-pay | Admitting: Urology

## 2024-08-22 DIAGNOSIS — R972 Elevated prostate specific antigen [PSA]: Secondary | ICD-10-CM

## 2024-08-22 LAB — PSA: Prostate Specific Ag, Serum: 16.4 ng/mL — ABNORMAL HIGH (ref 0.0–4.0)

## 2024-08-22 MED ORDER — LEVOFLOXACIN 750 MG PO TABS: 750.0000 mg | ORAL_TABLET | Freq: Once | ORAL | 0 refills | Status: AC

## 2024-08-22 NOTE — Telephone Encounter (Signed)
 Called pt to give PSA results per MD Dahlstedt and schedule prostate biopsy pt scheduled for bx on 09/18/2024 at [facility] pt requested instructions be sent to verified home address advised pt to c/b with any questions or concerns he may have

## 2024-08-22 NOTE — Telephone Encounter (Signed)
-----   Message from Garnette HERO Dahlstedt sent at 08/22/2024  4:31 PM EDT ----- Please call patient-his PSA was in the 16 range, so it has not declined that much.  Let him know that we should proceed at this point with ultrasound and biopsy of the prostate.  I have put orders in  for that.  Please schedule. ----- Message ----- From: Rebecka Memos Lab Results In Sent: 08/21/2024   3:36 PM EDT To: Garnette Shack, MD

## 2024-08-24 ENCOUNTER — Inpatient Hospital Stay (HOSPITAL_BASED_OUTPATIENT_CLINIC_OR_DEPARTMENT_OTHER): Admitting: Oncology

## 2024-08-24 ENCOUNTER — Telehealth: Payer: Self-pay

## 2024-08-24 VITALS — BP 136/86 | HR 63 | Temp 97.5°F | Resp 16 | Wt 263.7 lb

## 2024-08-24 DIAGNOSIS — D649 Anemia, unspecified: Secondary | ICD-10-CM | POA: Diagnosis not present

## 2024-08-24 DIAGNOSIS — R972 Elevated prostate specific antigen [PSA]: Secondary | ICD-10-CM

## 2024-08-24 DIAGNOSIS — D693 Immune thrombocytopenic purpura: Secondary | ICD-10-CM

## 2024-08-24 NOTE — Progress Notes (Signed)
 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   -He was reduced to every other day by Dr. Burston on 05/29/2024. -He was switched to 2 days on 1 day off. -He is taking it with food.  He continues to have episodes of loose stools but overall this is stable.  - Reviewed labs today: LFTs are normal.  Creatinine is normal at 1.57. - Platelet count is 163 up from 92. - Recommend follow-up in 6 weeks with repeat labs only and in 12 weeks with labs, CMP. Elevated PSA He is followed by urology Dr. Matilda.  He has scheduled biopsy next month.  Will keep an eye out for results. Normocytic anemia Has mildly trended down over the past 3 months. Will check nutritional labs at his next visit.  Orders Placed This Encounter  Procedures   Comprehensive metabolic panel    Standing Status:   Future    Expected Date:   10/05/2024    Expiration Date:   01/03/2025   Ferritin    Standing Status:   Future    Expected Date:   10/05/2024    Expiration Date:   01/03/2025   CBC with Differential/Platelet    Standing Status:   Future    Expected Date:   10/05/2024    Expiration Date:   01/03/2025   Copper , serum    Standing Status:   Future    Expected Date:   10/05/2024    Expiration Date:   01/03/2025   Vitamin B12    Standing Status:   Future    Expected Date:   10/05/2024    Expiration Date:   01/03/2025   Methylmalonic acid, serum    Standing Status:   Future    Expected Date:   10/05/2024    Expiration Date:   01/03/2025   Iron and TIBC    Standing Status:   Future    Expected Date:   10/05/2024    Expiration Date:   01/03/2025   Folate    Standing Status:   Future    Expected Date:   10/05/2024    Expiration Date:   01/03/2025    INTERVAL HISTORY: Patient returns for follow-up for ITP.  Reports he is doing well denies any new concerns.   Appetite is 100% energy levels are 30 to 40%.  Denies any pain.  He has some diarrhea associated with the use of Doptelet .  Reports it is improving though.  He is currently taking Doptelet  20 mg 2 days on 1 day off.  Reports he recently was found to have an elevated PSA.  He is followed by urology Dr. Matilda and is having a biopsy next month.  We reviewed CBC, CMP and PSA.  SUMMARY OF HEMATOLOGIC HISTORY: Oncology History Overview Note  1.  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  Bone marrow biopsy in 2009 was reportedly normal.  This was done in [place]. -CTAP showed fatty infiltration of the liver with normal spleen. -Work-up for nutritional deficiencies, plasma cell disorder was negative.  Infectious etiology was negative.  LDH was normal. -Platelet count dropped to 29 on 01/26/2020 and was treated with dexamethasone  for 4 days. -Dexamethasone  made to him have epigastric pain, indigestion, burping and hiccups. -Peak platelet count reached 108 on 01/31/2020. -As he cannot  tolerate dexamethasone , options include Promacta . - Promacta  25 mg daily started on 04/20/2023, discontinued due to poor tolerance. - Doptelet  20 mg daily started on 05/13/2024, dose changed to every other day on 05/28/2024      No history exists.     CBC    Component Value Date/Time   WBC 5.7 08/21/2024 1218   RBC 3.52 (L) 08/21/2024 1218   HGB 11.5 (L) 08/21/2024 1218   HCT 34.1 (L) 08/21/2024 1218   PLT 163 08/21/2024 1218   MCV 96.9 08/21/2024 1218   MCH 32.7 08/21/2024 1218   MCHC 33.7 08/21/2024 1218   RDW 14.5 08/21/2024 1218   LYMPHSABS 1.9 08/21/2024 1218   MONOABS 0.5 08/21/2024 1218   EOSABS 0.3 08/21/2024 1218   BASOSABS 0.0 08/21/2024 1218       Latest Ref Rng & Units 08/21/2024   12:18 PM 07/10/2024   10:18 AM 05/29/2024   11:32 AM  CMP  Glucose 70 - 99 mg/dL 847  836  834   BUN 8 - 23 mg/dL 24  29  22    Creatinine 0.61 - 1.24 mg/dL 8.70   8.42  8.77   Sodium 135 - 145 mmol/L 140  135  136   Potassium 3.5 - 5.1 mmol/L 4.1  4.2  3.6   Chloride 98 - 111 mmol/L 103  106  107   CO2 22 - 32 mmol/L 19  17  19    Calcium 8.9 - 10.3 mg/dL 9.1  9.1  8.8   Total Protein 6.5 - 8.1 g/dL 7.1  6.8  7.1   Total Bilirubin 0.0 - 1.2 mg/dL 0.4  0.8  0.7   Alkaline Phos 38 - 126 U/L 71  67  55   AST 15 - 41 U/L 29  22  25    ALT 0 - 44 U/L 9  15  20       Lab Results  Component Value Date   VITAMINB12 769 09/01/2023    Vitals:   08/24/24 1128  BP: 136/86  Pulse: 63  Resp: 16  Temp: (!) 97.5 F (36.4 C)  SpO2: 95%    Review of System:  Review of Systems  Constitutional:  Positive for malaise/fatigue.  Respiratory:  Positive for shortness of breath.   Gastrointestinal:  Positive for diarrhea.  Neurological:  Positive for dizziness, tingling and sensory change.  Psychiatric/Behavioral:  The patient has insomnia.     Physical Exam: Physical Exam Constitutional:      Appearance: Normal appearance.  HENT:     Head: Normocephalic and atraumatic.  Eyes:     Pupils: Pupils are equal, round, and reactive to light.  Cardiovascular:     Rate and Rhythm: Normal rate and regular rhythm.     Heart sounds: Normal heart sounds. No murmur heard. Pulmonary:     Effort: Pulmonary effort is normal.     Breath sounds: Normal breath sounds. No wheezing.  Abdominal:     General: Bowel sounds are normal. There is no distension.     Palpations: Abdomen is soft.     Tenderness: There is no abdominal tenderness.  Musculoskeletal:        General: Normal range of motion.     Cervical back: Normal range of motion.  Skin:    General: Skin is warm and dry.     Findings: No rash.  Neurological:     Mental Status: He is alert and oriented to person, place, and time.     Gait: Gait  is intact.  Psychiatric:        Mood and Affect: Mood and affect normal.        Cognition and Memory: Memory normal.        Judgment: Judgment normal.      I  spent 25 minutes dedicated to the care of this patient (face-to-face and non-face-to-face) on the date of the encounter to include what is described in the assessment and plan.,  Delon Hope, NP 08/24/2024 12:24 PM

## 2024-08-24 NOTE — Assessment & Plan Note (Addendum)
-   He was started on Doptelet  20 mg daily on 05/13/2024.   -He was reduced to every other day by Dr. Sanon on 05/29/2024. -He was switched to 2 days on 1 day off. -He is taking it with food.  He continues to have episodes of loose stools but overall this is stable.  - Reviewed labs today: LFTs are normal.  Creatinine is normal at 1.57. - Platelet count is 163 up from 92. - Recommend follow-up in 6 weeks with repeat labs only and in 12 weeks with labs, CMP.

## 2024-08-24 NOTE — Assessment & Plan Note (Addendum)
 He is followed by urology Dr. Matilda.  He has scheduled biopsy next month.  Will keep an eye out for results.

## 2024-08-24 NOTE — Telephone Encounter (Signed)
 Tried return call to pt regarding his PSA lab with no answer.  Pt left voiced message  state's he drink a lot of diet coke and want his PSA drawn again due to the intake of diet coke.

## 2024-08-24 NOTE — Assessment & Plan Note (Addendum)
 Has mildly trended down over the past 3 months. Will check nutritional labs at his next visit.

## 2024-08-27 ENCOUNTER — Other Ambulatory Visit (HOSPITAL_COMMUNITY): Payer: Self-pay

## 2024-09-03 DIAGNOSIS — H6121 Impacted cerumen, right ear: Secondary | ICD-10-CM | POA: Diagnosis not present

## 2024-09-07 ENCOUNTER — Telehealth: Payer: Self-pay | Admitting: Pharmacy Technician

## 2024-09-07 NOTE — Telephone Encounter (Signed)
 Oral Oncology Patient Advocate Encounter   Began re-enrollment application for assistance for Doptelet  through [employer].   Application will be submitted upon completion of necessary supporting documentation.   [employer] phone number [phone].   Patient portion is completed.  Waiting on provider's signature, this part has been emailed to Joesph Bohr, CHARITY FUNDRAISER.   Emberlie Gotcher ([name]) [name], [name]  [facility], [name], [facility] - Oral Chemotherapy Patient Advocate Specialist III Phone: [phone]  Fax: [phone]

## 2024-09-07 NOTE — Telephone Encounter (Signed)
 Oral Oncology Patient Advocate Encounter   Submitted application for assistance for Doptelet  to [employer].   Application submitted via e-fax to [phone]   [employer] phone number [phone].   I will continue to check the status until final determination.   Mishael Krysiak ([name]) [name], [name]  [facility], [name], [facility] - Oral Chemotherapy Patient Advocate Specialist III Phone: [phone]  Fax: [phone]

## 2024-09-11 ENCOUNTER — Other Ambulatory Visit: Payer: Self-pay | Admitting: *Deleted

## 2024-09-11 DIAGNOSIS — D693 Immune thrombocytopenic purpura: Secondary | ICD-10-CM

## 2024-09-11 MED ORDER — AVATROMBOPAG MALEATE 20 MG PO TABS: ORAL_TABLET | ORAL | 1 refills | Status: AC

## 2024-09-12 ENCOUNTER — Other Ambulatory Visit: Payer: Self-pay

## 2024-09-12 ENCOUNTER — Telehealth: Payer: Self-pay

## 2024-09-12 DIAGNOSIS — N400 Enlarged prostate without lower urinary tract symptoms: Secondary | ICD-10-CM

## 2024-09-12 MED ORDER — LEVOFLOXACIN 750 MG PO TABS: 750.0000 mg | ORAL_TABLET | Freq: Once | ORAL | 0 refills | Status: AC

## 2024-09-12 NOTE — Telephone Encounter (Signed)
 Called pt back about his question regarding his prostate biopsy. Detailed message left making pt aware Fleets enema  purchase over the counter and Levaquin  sent to pharmacy.

## 2024-09-17 NOTE — Progress Notes (Signed)
 TRANSRECTAL ULTRASOUND AND PROSTATE BIOPSY  Indication:  Elevated PSA  Prophylactic antibiotic administration: Gentamicin , levaquin   All medications that could result in increased bleeding were discontinued within an appropriate period of the time of biopsy.  Risk including bleeding and infection were discussed.  Informed consent was obtained.  The patient was placed in the left lateral decubitus position.  PROCEDURE 1.  TRANSRECTAL ULTRASOUND OF THE PROSTATE  The 7 MHz transrectal probe was used to image the prostate.  Anal stenosis {WAS/WAS NOT:[phone]::was not} noted.  TRUS volume: *** ml  Hypoechoic areas: {prostate anatomy:26400}  Hyperechoic areas: {prostate anatomy:26400}  Central calcifications: {DESC; PRESENT/NOT PRESENT:21021351}  Margins:  {Normal/Abnormal Appearance:21344::normal}  Seminal Vesicles: {Normal/Abnormal Appearance:21344::normal}   PROCEDURE 2:  PROSTATE BIOPSY  A periprostatic block was performed using 1% lidocaine  and transrectal ultrasound guidance. Under transrectal ultrasound guidance, and using the Biopty gun, prostate biopsies were obtained systematically from the apex, mid gland, and base bilaterally.  A total of *** cores were obtained.  Hemostasis was obtained with gentle pressure on the prostate.  The procedures were well-tolerated.  No significant bleeding was noted at the end of the procedure.  The patient was stable for discharge from the office.

## 2024-09-18 ENCOUNTER — Ambulatory Visit (HOSPITAL_COMMUNITY)
Admission: RE | Admit: 2024-09-18 | Discharge: 2024-09-18 | Disposition: A | Discharge status: Home / Self Care | Source: Ambulatory Visit | Attending: Urology | Admitting: Urology

## 2024-09-18 ENCOUNTER — Other Ambulatory Visit: Payer: Self-pay | Admitting: Urology

## 2024-09-18 ENCOUNTER — Ambulatory Visit (HOSPITAL_BASED_OUTPATIENT_CLINIC_OR_DEPARTMENT_OTHER): Admitting: Urology

## 2024-09-18 DIAGNOSIS — R972 Elevated prostate specific antigen [PSA]: Secondary | ICD-10-CM

## 2024-09-18 MED ORDER — LIDOCAINE HCL (PF) 2 % IJ SOLN
INTRAMUSCULAR | Status: AC
  Filled 2024-09-18: qty 10

## 2024-09-18 MED ORDER — GENTAMICIN SULFATE 40 MG/ML IJ SOLN
INTRAMUSCULAR | Status: AC
  Filled 2024-09-18: qty 4

## 2024-09-18 MED ORDER — GENTAMICIN SULFATE 40 MG/ML IJ SOLN
160.0000 mg | Freq: Once | INTRAMUSCULAR | Status: AC
  Administered 2024-09-18: 160 mg via INTRAMUSCULAR

## 2024-09-18 NOTE — Progress Notes (Signed)
 Patient brought to US  RM1 per WC, in no obvious distress. Prostate biopsy explained, consent obtained. Prepped and draped in sterile fashion. US  imaging obtained. Antibiotic admin as transcribed with no adverse reaction. Local anesthetic admin without incident. Samples obtained. Patient allowed to sit up on edge of stretcher, no complaints. No residual bleeding evident from procedure. DC instructions explained and given. Transported to radiology waiting to care of spouse.

## 2024-09-19 ENCOUNTER — Ambulatory Visit: Payer: Self-pay

## 2024-09-19 DIAGNOSIS — R972 Elevated prostate specific antigen [PSA]: Secondary | ICD-10-CM

## 2024-09-19 LAB — SURGICAL PATHOLOGY

## 2024-09-19 NOTE — Telephone Encounter (Signed)
-----   Message from Garnette HERO Dahlstedt sent at 09/19/2024  2:04 PM EST ----- Please call patient-good news, all of his biopsies were benign.  Have him follow-up with Dr. Sherrilee in 6 months after PSA. ----- Message ----- From: Interface, Lab In Three Zero One Sent: 09/19/2024   9:19 AM EST To: Garnette Shack, MD

## 2024-09-19 NOTE — Telephone Encounter (Signed)
 Called pt to give results per MD pt scheduled and advised message will be sent when close to upcoming appointments pt voiced his understanding

## 2024-09-27 DIAGNOSIS — E782 Mixed hyperlipidemia: Secondary | ICD-10-CM | POA: Diagnosis not present

## 2024-09-27 DIAGNOSIS — E1169 Type 2 diabetes mellitus with other specified complication: Secondary | ICD-10-CM | POA: Diagnosis not present

## 2024-09-28 LAB — LAB REPORT - SCANNED: EGFR: 51

## 2024-10-03 DIAGNOSIS — G9009 Other idiopathic peripheral autonomic neuropathy: Secondary | ICD-10-CM | POA: Diagnosis not present

## 2024-10-03 DIAGNOSIS — Z0000 Encounter for general adult medical examination without abnormal findings: Secondary | ICD-10-CM | POA: Diagnosis not present

## 2024-10-03 DIAGNOSIS — F5104 Psychophysiologic insomnia: Secondary | ICD-10-CM | POA: Diagnosis not present

## 2024-10-03 DIAGNOSIS — E782 Mixed hyperlipidemia: Secondary | ICD-10-CM | POA: Diagnosis not present

## 2024-10-03 DIAGNOSIS — D696 Thrombocytopenia, unspecified: Secondary | ICD-10-CM | POA: Diagnosis not present

## 2024-10-03 DIAGNOSIS — E1169 Type 2 diabetes mellitus with other specified complication: Secondary | ICD-10-CM | POA: Diagnosis not present

## 2024-10-03 DIAGNOSIS — Z0001 Encounter for general adult medical examination with abnormal findings: Secondary | ICD-10-CM | POA: Diagnosis not present

## 2024-10-03 DIAGNOSIS — D649 Anemia, unspecified: Secondary | ICD-10-CM | POA: Diagnosis not present

## 2024-10-03 DIAGNOSIS — I10 Essential (primary) hypertension: Secondary | ICD-10-CM | POA: Diagnosis not present

## 2024-10-03 DIAGNOSIS — G20C Parkinsonism, unspecified: Secondary | ICD-10-CM | POA: Diagnosis not present

## 2024-10-03 DIAGNOSIS — G4733 Obstructive sleep apnea (adult) (pediatric): Secondary | ICD-10-CM | POA: Diagnosis not present

## 2024-10-03 DIAGNOSIS — N1831 Chronic kidney disease, stage 3a: Secondary | ICD-10-CM | POA: Diagnosis not present

## 2024-10-04 DIAGNOSIS — E113312 Type 2 diabetes mellitus with moderate nonproliferative diabetic retinopathy with macular edema, left eye: Secondary | ICD-10-CM | POA: Diagnosis not present

## 2024-10-05 ENCOUNTER — Inpatient Hospital Stay: Attending: Hematology

## 2024-10-05 DIAGNOSIS — D693 Immune thrombocytopenic purpura: Secondary | ICD-10-CM

## 2024-10-05 DIAGNOSIS — D649 Anemia, unspecified: Secondary | ICD-10-CM | POA: Diagnosis not present

## 2024-10-05 DIAGNOSIS — R972 Elevated prostate specific antigen [PSA]: Secondary | ICD-10-CM | POA: Diagnosis not present

## 2024-10-05 LAB — CBC WITH DIFFERENTIAL/PLATELET
Abs Immature Granulocytes: 0.02 K/uL (ref 0.00–0.07)
Basophils Absolute: 0 K/uL (ref 0.0–0.1)
Basophils Relative: 1 %
Eosinophils Absolute: 0.3 K/uL (ref 0.0–0.5)
Eosinophils Relative: 6 %
HCT: 31.2 % — ABNORMAL LOW (ref 39.0–52.0)
Hemoglobin: 10.4 g/dL — ABNORMAL LOW (ref 13.0–17.0)
Immature Granulocytes: 0 %
Lymphocytes Relative: 34 %
Lymphs Abs: 1.7 K/uL (ref 0.7–4.0)
MCH: 32.4 pg (ref 26.0–34.0)
MCHC: 33.3 g/dL (ref 30.0–36.0)
MCV: 97.2 fL (ref 80.0–100.0)
Monocytes Absolute: 0.4 K/uL (ref 0.1–1.0)
Monocytes Relative: 8 %
Neutro Abs: 2.6 K/uL (ref 1.7–7.7)
Neutrophils Relative %: 51 %
Platelets: 139 K/uL — ABNORMAL LOW (ref 150–400)
RBC: 3.21 MIL/uL — ABNORMAL LOW (ref 4.22–5.81)
RDW: 15 % (ref 11.5–15.5)
WBC: 5 K/uL (ref 4.0–10.5)
nRBC: 0.4 % — ABNORMAL HIGH (ref 0.0–0.2)

## 2024-10-05 LAB — IRON AND TIBC
Iron: 72 ug/dL (ref 45–182)
Saturation Ratios: 29 % (ref 17.9–39.5)
TIBC: 252 ug/dL (ref 250–450)
UIBC: 180 ug/dL

## 2024-10-05 LAB — COMPREHENSIVE METABOLIC PANEL WITH GFR
ALT: 13 U/L (ref 0–44)
AST: 20 U/L (ref 15–41)
Albumin: 4.1 g/dL (ref 3.5–5.0)
Alkaline Phosphatase: 70 U/L (ref 38–126)
Anion gap: 14 (ref 5–15)
BUN: 26 mg/dL — ABNORMAL HIGH (ref 8–23)
CO2: 17 mmol/L — ABNORMAL LOW (ref 22–32)
Calcium: 9.1 mg/dL (ref 8.9–10.3)
Chloride: 107 mmol/L (ref 98–111)
Creatinine, Ser: 1.34 mg/dL — ABNORMAL HIGH (ref 0.61–1.24)
GFR, Estimated: 53 mL/min — ABNORMAL LOW (ref >60)
Glucose, Bld: 106 mg/dL — ABNORMAL HIGH (ref 70–99)
Potassium: 4.1 mmol/L (ref 3.5–5.1)
Sodium: 138 mmol/L (ref 135–145)
Total Bilirubin: 0.3 mg/dL (ref 0.0–1.2)
Total Protein: 6.9 g/dL (ref 6.5–8.1)

## 2024-10-05 LAB — FERRITIN: Ferritin: 132 ng/mL (ref 24–336)

## 2024-10-05 LAB — FOLATE: Folate: >20 ng/mL (ref >5.9)

## 2024-10-05 LAB — VITAMIN B12: Vitamin B-12: 1112 pg/mL — ABNORMAL HIGH (ref 180–914)

## 2024-10-07 LAB — COPPER, SERUM: Copper: 73 ug/dL (ref 69–132)

## 2024-10-09 LAB — METHYLMALONIC ACID, SERUM: Methylmalonic Acid, Quantitative: 200 nmol/L (ref 0–378)

## 2024-10-22 ENCOUNTER — Encounter: Payer: Self-pay | Admitting: *Deleted

## 2024-11-06 NOTE — Telephone Encounter (Signed)
 Oral Oncology Patient Advocate Encounter  Called to check on the status of the patient's re-enrollment application. Per the representative it is still under review.  I will continue to check the status until final determination.  Rutha Melgoza ([name]) [name], [name]  [facility], [name], [facility] - Oral Chemotherapy Patient Advocate Specialist III Phone: [phone]  Fax: [phone]

## 2024-11-08 NOTE — Telephone Encounter (Signed)
 Oral Oncology Patient Advocate Encounter  Called to check on the status of the patient's application. Per the representative the application is now in the final step and they are waiting to speak with the patient.  I called and lvm on the patient's preferred phone number to either give me a call back or call [employer].  I will continue to check the status until final determination.  Damare Serano ([name]) [name], [name]  [facility], [name], [facility] - Oral Chemotherapy Patient Advocate Specialist III Phone: [phone]  Fax: [phone]

## 2024-11-13 ENCOUNTER — Other Ambulatory Visit: Payer: Self-pay

## 2024-11-13 DIAGNOSIS — D693 Immune thrombocytopenic purpura: Secondary | ICD-10-CM

## 2024-11-13 DIAGNOSIS — D649 Anemia, unspecified: Secondary | ICD-10-CM

## 2024-11-13 NOTE — Telephone Encounter (Signed)
 Oral Oncology Patient Advocate Encounter  Patient knows to call [employer].  [employer] phone number [phone].   I will continue to check the status until final determination.  Brian [name] ([name]) [name], [name]  [facility], [name], [facility] - Oral Chemotherapy Patient Advocate Specialist III Phone: [phone]  Fax: [phone]

## 2024-11-14 ENCOUNTER — Inpatient Hospital Stay

## 2024-11-14 ENCOUNTER — Inpatient Hospital Stay: Attending: Hematology | Admitting: Oncology

## 2024-11-14 VITALS — BP 147/73 | HR 64 | Temp 98.3°F | Resp 18 | Wt 265.7 lb

## 2024-11-14 DIAGNOSIS — R972 Elevated prostate specific antigen [PSA]: Secondary | ICD-10-CM

## 2024-11-14 DIAGNOSIS — D693 Immune thrombocytopenic purpura: Secondary | ICD-10-CM

## 2024-11-14 DIAGNOSIS — D649 Anemia, unspecified: Secondary | ICD-10-CM

## 2024-11-14 LAB — CBC WITH DIFFERENTIAL/PLATELET
Abs Immature Granulocytes: 0.02 K/uL (ref 0.00–0.07)
Basophils Absolute: 0 K/uL (ref 0.0–0.1)
Basophils Relative: 1 %
Eosinophils Absolute: 0.3 K/uL (ref 0.0–0.5)
Eosinophils Relative: 6 %
HCT: 32.4 % — ABNORMAL LOW (ref 39.0–52.0)
Hemoglobin: 10.8 g/dL — ABNORMAL LOW (ref 13.0–17.0)
Immature Granulocytes: 0 %
Lymphocytes Relative: 30 %
Lymphs Abs: 1.6 K/uL (ref 0.7–4.0)
MCH: 32.5 pg (ref 26.0–34.0)
MCHC: 33.3 g/dL (ref 30.0–36.0)
MCV: 97.6 fL (ref 80.0–100.0)
Monocytes Absolute: 0.4 K/uL (ref 0.1–1.0)
Monocytes Relative: 8 %
Neutro Abs: 2.9 K/uL (ref 1.7–7.7)
Neutrophils Relative %: 55 %
Platelets: 130 K/uL — ABNORMAL LOW (ref 150–400)
RBC: 3.32 MIL/uL — ABNORMAL LOW (ref 4.22–5.81)
RDW: 15.4 % (ref 11.5–15.5)
WBC: 5.3 K/uL (ref 4.0–10.5)
nRBC: 0.4 % — ABNORMAL HIGH (ref 0.0–0.2)

## 2024-11-14 LAB — COMPREHENSIVE METABOLIC PANEL WITH GFR
ALT: 12 U/L (ref 0–44)
AST: 19 U/L (ref 15–41)
Albumin: 3.9 g/dL (ref 3.5–5.0)
Alkaline Phosphatase: 62 U/L (ref 38–126)
Anion gap: 14 (ref 5–15)
BUN: 26 mg/dL — ABNORMAL HIGH (ref 8–23)
CO2: 18 mmol/L — ABNORMAL LOW (ref 22–32)
Calcium: 8.8 mg/dL — ABNORMAL LOW (ref 8.9–10.3)
Chloride: 104 mmol/L (ref 98–111)
Creatinine, Ser: 1.37 mg/dL — ABNORMAL HIGH (ref 0.61–1.24)
GFR, Estimated: 52 mL/min — ABNORMAL LOW
Glucose, Bld: 125 mg/dL — ABNORMAL HIGH (ref 70–99)
Potassium: 3.9 mmol/L (ref 3.5–5.1)
Sodium: 136 mmol/L (ref 135–145)
Total Bilirubin: 0.4 mg/dL (ref 0.0–1.2)
Total Protein: 6.8 g/dL (ref 6.5–8.1)

## 2024-11-14 NOTE — Assessment & Plan Note (Addendum)
 Has mildly trended down over the past 6 months. Nutritional labs from 10/05/2024 are essentially unremarkable. 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

## 2024-11-14 NOTE — Assessment & Plan Note (Addendum)
 He is followed by urology Dr. Matilda.  He has scheduled biopsy next month.  Will keep an eye out for results.

## 2024-11-14 NOTE — Assessment & Plan Note (Addendum)
-   He was started on Doptelet  20 mg daily on 05/13/2024.   -He was reduced to every other day by Dr. Mcray on 05/29/2024. -He was switched to 2 days on 1 day off.  He reports he is taking 3-4 tabs weekly when he remembers and occasionally has forgotten to take it for about 2 weeks. -He is taking it with food.  He continues to have episodes of loose stools but overall this is stable.  - Reviewed labs today: LFTs are normal.  Creatinine is 1.37 which is baseline. - Platelet count is 130.  -Discussed taking 3 tabs 1 week and 4 tabs the next. - Recommend follow-up in 12 weeks with labs a few days before.

## 2024-11-14 NOTE — Progress Notes (Signed)
 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   -He was reduced to every other day by Dr. Rocque on 05/29/2024. -He was switched to 2 days on 1 day off.  He reports he is taking 3-4 tabs weekly when he remembers and occasionally has forgotten to take it for about 2 weeks. -He is taking it with food.  He continues to have episodes of loose stools but overall this is stable.  - Reviewed labs today: LFTs are normal.  Creatinine is 1.37 which is baseline. - Platelet count is 130.  -Discussed taking 3 tabs 1 week and 4 tabs the next. - Recommend follow-up in 12 weeks with labs a few days before. Normocytic anemia Has mildly trended down over the past 6 months. Nutritional labs from 10/05/2024 are essentially unremarkable. 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  Elevated PSA He is followed by urology Dr. Matilda.  He has scheduled biopsy next month.  Will keep an eye out for results.  Orders Placed This Encounter  Procedures   Comprehensive metabolic panel with GFR    Standing Status:   Future    Expected Date:   02/12/2025    Expiration Date:   05/13/2025    Release to patient:   Immediate   CBC with Differential/Platelet    Standing Status:   Future    Expected Date:   02/12/2025    Expiration Date:   05/13/2025    Release to patient:   Immediate    INTERVAL HISTORY: Patient returns for follow-up for ITP.  Reports he is doing well denies any new concerns.  Appetite is 75% and energy levels are low.  Denies any pain.  Reports stable fatigue with frequent urination.    Patient recently started seeing a retina specialist due to a retinal bleed in his right eye.    Patient reports inconsistent compliance with his hydroxyurea.  Reports occasionally, he will not take it at all especially if he is traveling.   Hydroxyurea causes diarrhea for him.  Reports over Christmas, he went to [place]  and did not take it for 2 weeks.  Reports he forgets to take it at home sometimes as well due to the time of day he takes it.  Reports he has to take it with food and at lunchtime is the best time for him to take it.  He thinks on average, he may have been taking it 2-3 times per week.  Reports he recently was found to have an elevated PSA.  He is followed by urology Dr. Matilda and had a prostate biopsy on 09/18/2024.  Pathology showed benign prostatic tissue.  We reviewed CBC, CMP and PSA.  SUMMARY OF HEMATOLOGIC HISTORY: Oncology History Overview Note  1.  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  Bone marrow biopsy in 2009 was reportedly normal.  This was done in [place]. -CTAP showed fatty infiltration of the liver with normal spleen. -Work-up for nutritional deficiencies, plasma cell disorder was negative.  Infectious etiology was negative.  LDH was normal. -Platelet count dropped to 29 on 01/26/2020 and was treated with dexamethasone  for 4 days. -Dexamethasone  made to him have epigastric pain, indigestion, burping and hiccups. -Peak platelet count reached 108 on 01/31/2020. -As he cannot tolerate dexamethasone , options include Promacta . -  Promacta  25 mg daily started on 04/20/2023, discontinued due to poor tolerance. - Doptelet  20 mg daily started on 05/13/2024, dose changed to every other day on 05/28/2024      No problem history exists.     CBC    Component Value Date/Time   WBC 5.3 11/14/2024 1219   RBC 3.32 (L) 11/14/2024 1219   HGB 10.8 (L) 11/14/2024 1219   HCT 32.4 (L) 11/14/2024 1219   PLT 130 (L) 11/14/2024 1219   MCV 97.6 11/14/2024 1219   MCH 32.5 11/14/2024 1219   MCHC 33.3 11/14/2024 1219   RDW 15.4 11/14/2024 1219   LYMPHSABS 1.6 11/14/2024 1219   MONOABS 0.4 11/14/2024 1219   EOSABS 0.3 11/14/2024 1219   BASOSABS 0.0 11/14/2024 1219        Latest Ref Rng & Units 11/14/2024   12:19 PM 10/05/2024   10:48 AM 08/21/2024   12:18 PM  CMP  Glucose 70 - 99 mg/dL 874  893  847   BUN 8 - 23 mg/dL 26  26  24    Creatinine 0.61 - 1.24 mg/dL 8.62  8.65  8.70   Sodium 135 - 145 mmol/L 136  138  140   Potassium 3.5 - 5.1 mmol/L 3.9  4.1  4.1   Chloride 98 - 111 mmol/L 104  107  103   CO2 22 - 32 mmol/L 18  17  19    Calcium 8.9 - 10.3 mg/dL 8.8  9.1  9.1   Total Protein 6.5 - 8.1 g/dL 6.8  6.9  7.1   Total Bilirubin 0.0 - 1.2 mg/dL 0.4  0.3  0.4   Alkaline Phos 38 - 126 U/L 62  70  71   AST 15 - 41 U/L 19  20  29    ALT 0 - 44 U/L 12  13  9       Lab Results  Component Value Date   FERRITIN 132 10/05/2024   VITAMINB12 1,112 (H) 10/05/2024    Vitals:   11/14/24 1319 11/14/24 1324  BP: (!) 147/73 (!) 147/73  Pulse: 64   Resp: 18   Temp: 98.3 F (36.8 C)   SpO2: 97%     Review of System:  Review of Systems  Constitutional:  Positive for malaise/fatigue.  Gastrointestinal:  Positive for diarrhea.  Psychiatric/Behavioral:  The patient has insomnia.     Physical Exam: Physical Exam Constitutional:      Appearance: Normal appearance.  HENT:     Head: Normocephalic and atraumatic.  Eyes:     Pupils: Pupils are equal, round, and reactive to light.  Cardiovascular:     Rate and Rhythm: Normal rate and regular rhythm.     Heart sounds: Normal heart sounds. No murmur heard. Pulmonary:     Effort: Pulmonary effort is normal.     Breath sounds: Normal breath sounds. No wheezing.  Abdominal:     General: Bowel sounds are normal. There is no distension.     Palpations: Abdomen is soft.     Tenderness: There is no abdominal tenderness.  Musculoskeletal:        General: Normal range of motion.     Cervical back: Normal range of motion.  Skin:    General: Skin is warm and dry.     Findings: No rash.  Neurological:     Mental Status: He is alert and oriented to person, place, and time.     Gait: Gait is intact.   Psychiatric:  Mood and Affect: Mood and affect normal.        Cognition and Memory: Memory normal.        Judgment: Judgment normal.      I spent 25 minutes dedicated to the care of this patient (face-to-face and non-face-to-face) on the date of the encounter to include what is described in the assessment and plan.,  Delon Hope, NP 11/14/2024 2:01 PM "

## 2024-11-15 NOTE — Telephone Encounter (Signed)
 Oral Oncology Patient Advocate Encounter   Received notification that the application for assistance for Doptelet  through [employer]  [employer] has been approved.   [employer]  [employer] phone number [phone].   Effective dates: 11/15/2024 through 10/24/2025  Medication will be filled at [facility] ([place]  location).  I have spoken to the patient.  Joniqua Sidle ([name]) [name], [name]  [facility], [name], [facility] - Oral Chemotherapy Patient Advocate Specialist III Phone: [phone]  Fax: [phone]

## 2025-02-20 ENCOUNTER — Inpatient Hospital Stay

## 2025-02-20 ENCOUNTER — Inpatient Hospital Stay: Admitting: Oncology

## 2025-03-21 ENCOUNTER — Other Ambulatory Visit

## 2025-03-27 ENCOUNTER — Ambulatory Visit: Admitting: Urology
